# Patient Record
Sex: Female | Born: 1980 | Race: White | Hispanic: No | Marital: Single | Smoking: Never smoker
Health system: Southern US, Community
[De-identification: ages and names within clinical notes are randomized; demographics above are authoritative.]

## PROBLEM LIST (undated history)

## (undated) DIAGNOSIS — N189 Chronic kidney disease, unspecified: Secondary | ICD-10-CM

## (undated) DIAGNOSIS — J309 Allergic rhinitis, unspecified: Secondary | ICD-10-CM

## (undated) DIAGNOSIS — J069 Acute upper respiratory infection, unspecified: Secondary | ICD-10-CM

## (undated) DIAGNOSIS — E2740 Unspecified adrenocortical insufficiency: Secondary | ICD-10-CM

## (undated) DIAGNOSIS — E240 Pituitary-dependent Cushing's disease: Secondary | ICD-10-CM

## (undated) DIAGNOSIS — M3130 Wegener's granulomatosis without renal involvement: Secondary | ICD-10-CM

## (undated) DIAGNOSIS — H52209 Unspecified astigmatism, unspecified eye: Secondary | ICD-10-CM

## (undated) DIAGNOSIS — I10 Essential (primary) hypertension: Secondary | ICD-10-CM

## (undated) DIAGNOSIS — D709 Neutropenia, unspecified: Secondary | ICD-10-CM

## (undated) DIAGNOSIS — J45909 Unspecified asthma, uncomplicated: Secondary | ICD-10-CM

## (undated) HISTORY — DX: Essential (primary) hypertension: I10

## (undated) HISTORY — DX: Allergic rhinitis, unspecified: J30.9

## (undated) HISTORY — DX: Unspecified astigmatism, unspecified eye: H52.209

## (undated) HISTORY — DX: Unspecified adrenocortical insufficiency: E27.40

## (undated) HISTORY — PX: ANTERIOR CERVICAL DECOMP/DISCECTOMY FUSION: SHX1161

## (undated) HISTORY — DX: Neutropenia, unspecified: D70.9

## (undated) HISTORY — PX: ADENOIDECTOMY: SUR15

## (undated) HISTORY — DX: Chronic kidney disease, unspecified: N18.9

## (undated) HISTORY — DX: Unspecified asthma, uncomplicated: J45.909

## (undated) HISTORY — DX: Pituitary-dependent Cushing's disease: E24.0

## (undated) HISTORY — DX: Acute upper respiratory infection, unspecified: J06.9

---
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
## 2002-02-04 ENCOUNTER — Other Ambulatory Visit: Admission: RE | Admit: 2002-02-04 | Discharge: 2002-02-04 | Payer: Self-pay | Admitting: Internal Medicine

## 2004-08-09 ENCOUNTER — Emergency Department (HOSPITAL_COMMUNITY): Admission: EM | Admit: 2004-08-09 | Discharge: 2004-08-09 | Payer: Self-pay | Admitting: *Deleted

## 2006-01-29 ENCOUNTER — Emergency Department (HOSPITAL_COMMUNITY): Admission: EM | Admit: 2006-01-29 | Discharge: 2006-01-29 | Payer: Self-pay | Admitting: Emergency Medicine

## 2014-09-13 ENCOUNTER — Encounter (HOSPITAL_BASED_OUTPATIENT_CLINIC_OR_DEPARTMENT_OTHER): Payer: Self-pay | Admitting: Emergency Medicine

## 2014-09-13 ENCOUNTER — Emergency Department (HOSPITAL_BASED_OUTPATIENT_CLINIC_OR_DEPARTMENT_OTHER)
Admission: EM | Admit: 2014-09-13 | Discharge: 2014-09-13 | Disposition: A | Discharge status: Home / Self Care | Payer: BLUE CROSS/BLUE SHIELD | Attending: Emergency Medicine | Admitting: Emergency Medicine

## 2014-09-13 DIAGNOSIS — Z880 Allergy status to penicillin: Secondary | ICD-10-CM | POA: Diagnosis not present

## 2014-09-13 DIAGNOSIS — M542 Cervicalgia: Secondary | ICD-10-CM

## 2014-09-13 DIAGNOSIS — M5412 Radiculopathy, cervical region: Secondary | ICD-10-CM | POA: Diagnosis not present

## 2014-09-13 DIAGNOSIS — R200 Anesthesia of skin: Secondary | ICD-10-CM | POA: Diagnosis not present

## 2014-09-13 MED ORDER — HYDROCODONE-ACETAMINOPHEN 5-325 MG PO TABS: 1.0000 | ORAL_TABLET | ORAL | Status: DC | PRN

## 2014-09-13 MED ORDER — DIAZEPAM 5 MG PO TABS: 5.0000 mg | ORAL_TABLET | Freq: Four times a day (QID) | ORAL | Status: DC | PRN

## 2014-09-13 NOTE — ED Notes (Signed)
Patient states that she has had neck and shoulder pain x 1 week. Been to urgent care and chiropractor  - patient on medications to assist but the pain is worse and her right arm is going  Numb.

## 2014-09-13 NOTE — Discharge Instructions (Signed)
Cervical Radiculopathy °Cervical radiculopathy happens when a nerve in the neck is pinched or bruised by a slipped (herniated) disk or by arthritic changes in the bones of the cervical spine. This can occur due to an injury or as part of the normal aging process. Pressure on the cervical nerves can cause pain or numbness that runs from your neck all the way down into your arm and fingers. °CAUSES  °There are many possible causes, including: °· Injury. °· Muscle tightness in the neck from overuse. °· Swollen, painful joints (arthritis). °· Breakdown or degeneration in the bones and joints of the spine (spondylosis) due to aging. °· Bone spurs that may develop near the cervical nerves. °SYMPTOMS  °Symptoms include pain, weakness, or numbness in the affected arm and hand. Pain can be severe or irritating. Symptoms may be worse when extending or turning the neck. °DIAGNOSIS  °Your caregiver will ask about your symptoms and do a physical exam. He or she may test your strength and reflexes. X-rays, CT scans, and MRI scans may be needed in cases of injury or if the symptoms do not go away after a period of time. Electromyography (EMG) or nerve conduction testing may be done to study how your nerves and muscles are working. °TREATMENT  °Your caregiver may recommend certain exercises to help relieve your symptoms. Cervical radiculopathy can, and often does, get better with time and treatment. If your problems continue, treatment options may include: °· Wearing a soft collar for short periods of time. °· Physical therapy to strengthen the neck muscles. °· Medicines, such as nonsteroidal anti-inflammatory drugs (NSAIDs), oral corticosteroids, or spinal injections. °· Surgery. Different types of surgery may be done depending on the cause of your problems. °HOME CARE INSTRUCTIONS  °· Put ice on the affected area. °· Put ice in a plastic bag. °· Place a towel between your skin and the bag. °· Leave the ice on for 15-20 minutes,  03-04 times a day or as directed by your caregiver. °· If ice does not help, you can try using heat. Take a warm shower or bath, or use a hot water bottle as directed by your caregiver. °· You may try a gentle neck and shoulder massage. °· Use a flat pillow when you sleep. °· Only take over-the-counter or prescription medicines for pain, discomfort, or fever as directed by your caregiver. °· If physical therapy was prescribed, follow your caregiver's directions. °· If a soft collar was prescribed, use it as directed. °SEEK IMMEDIATE MEDICAL CARE IF:  °· Your pain gets much worse and cannot be controlled with medicines. °· You have weakness or numbness in your hand, arm, face, or leg. °· You have a high fever or a stiff, rigid neck. °· You lose bowel or bladder control (incontinence). °· You have trouble with walking, balance, or speaking. °MAKE SURE YOU:  °· Understand these instructions. °· Will watch your condition. °· Will get help right away if you are not doing well or get worse. °Document Released: 12/06/2000 Document Revised: 06/05/2011 Document Reviewed: 10/25/2010 °ExitCare® Patient Information ©2015 [employer]. This information is not intended to replace advice given to you by your health care provider. Make sure you discuss any questions you have with your health care provider. ° ° ° ° ° °Cervical Sprain °A cervical sprain is an injury in the neck in which the strong, fibrous tissues (ligaments) that connect your neck bones stretch or tear. Cervical sprains can range from mild to severe. Severe cervical sprains   can cause the neck vertebrae to be unstable. This can lead to damage of the spinal cord and can result in serious nervous system problems. The amount of time it takes for a cervical sprain to get better depends on the cause and extent of the injury. Most cervical sprains heal in 1 to 3 weeks. °CAUSES  °Severe cervical sprains may be caused by:  °· Contact sport injuries (such as from football,  rugby, wrestling, hockey, auto racing, gymnastics, diving, martial arts, or boxing).   °· Motor vehicle collisions.   °· Whiplash injuries. This is an injury from a sudden forward and backward whipping movement of the head and neck.  °· Falls.   °Mild cervical sprains may be caused by:  °· Being in an awkward position, such as while cradling a telephone between your ear and shoulder.   °· Sitting in a chair that does not offer proper support.   °· Working at a poorly designed computer station.   °· Looking up or down for long periods of time.   °SYMPTOMS  °· Pain, soreness, stiffness, or a burning sensation in the front, back, or sides of the neck. This discomfort may develop immediately after the injury or slowly, 24 hours or more after the injury.   °· Pain or tenderness directly in the middle of the back of the neck.   °· Shoulder or upper back pain.   °· Limited ability to move the neck.   °· Headache.   °· Dizziness.   °· Weakness, numbness, or tingling in the hands or arms.   °· Muscle spasms.   °· Difficulty swallowing or chewing.   °· Tenderness and swelling of the neck.   °DIAGNOSIS  °Most of the time your health care provider can diagnose a cervical sprain by taking your history and doing a physical exam. Your health care provider will ask about previous neck injuries and any known neck problems, such as arthritis in the neck. X-rays may be taken to find out if there are any other problems, such as with the bones of the neck. Other tests, such as a CT scan or MRI, may also be needed.  °TREATMENT  °Treatment depends on the severity of the cervical sprain. Mild sprains can be treated with rest, keeping the neck in place (immobilization), and pain medicines. Severe cervical sprains are immediately immobilized. Further treatment is done to help with pain, muscle spasms, and other symptoms and may include: °· Medicines, such as pain relievers, numbing medicines, or muscle relaxants.   °· Physical therapy. This  may involve stretching exercises, strengthening exercises, and posture training. Exercises and improved posture can help stabilize the neck, strengthen muscles, and help stop symptoms from returning.   °HOME CARE INSTRUCTIONS  °· Put ice on the injured area.   °¨ Put ice in a plastic bag.   °¨ Place a towel between your skin and the bag.   °¨ Leave the ice on for 15-20 minutes, 3-4 times a day.   °· If your injury was severe, you may have been given a cervical collar to wear. A cervical collar is a two-piece collar designed to keep your neck from moving while it heals. °¨ Do not remove the collar unless instructed by your health care provider. °¨ If you have long hair, keep it outside of the collar. °¨ Ask your health care provider before making any adjustments to your collar. Minor adjustments may be required over time to improve comfort and reduce pressure on your chin or on the back of your head. °¨ If you are allowed to remove the collar for cleaning or bathing, follow your   health care provider's instructions on how to do so safely. °¨ Keep your collar clean by wiping it with mild soap and water and drying it completely. If the collar you have been given includes removable pads, remove them every 1-2 days and hand wash them with soap and water. Allow them to air dry. They should be completely dry before you wear them in the collar. °¨ If you are allowed to remove the collar for cleaning and bathing, wash and dry the skin of your neck. Check your skin for irritation or sores. If you see any, tell your health care provider. °¨ Do not drive while wearing the collar.   °· Only take over-the-counter or prescription medicines for pain, discomfort, or fever as directed by your health care provider.   °· Keep all follow-up appointments as directed by your health care provider.   °· Keep all physical therapy appointments as directed by your health care provider.   °· Make any needed adjustments to your workstation to  promote good posture.   °· Avoid positions and activities that make your symptoms worse.   °· Warm up and stretch before being active to help prevent problems.   °SEEK MEDICAL CARE IF:  °· Your pain is not controlled with medicine.   °· You are unable to decrease your pain medicine over time as planned.   °· Your activity level is not improving as expected.   °SEEK IMMEDIATE MEDICAL CARE IF:  °· You develop any bleeding. °· You develop stomach upset. °· You have signs of an allergic reaction to your medicine.   °· Your symptoms get worse.   °· You develop new, unexplained symptoms.   °· You have numbness, tingling, weakness, or paralysis in any part of your body.   °MAKE SURE YOU:  °· Understand these instructions. °· Will watch your condition. °· Will get help right away if you are not doing well or get worse. °Document Released: 01/08/2007 Document Revised: 03/18/2013 Document Reviewed: 09/18/2012 °ExitCare® Patient Information ©2015 [employer]. This information is not intended to replace advice given to you by your health care provider. Make sure you discuss any questions you have with your health care provider. ° °

## 2014-09-13 NOTE — ED Provider Notes (Signed)
CSN: [id number]     Arrival date & time 09/13/14  1835 History  This chart was scribed for Pricilla Loveless, MD by Doreatha Martin, ED Scribe. This patient was seen in room MH01/MH01 and the patient's care was started at 6:59 PM.     Chief Complaint  Patient presents with  . Neck Pain   The history is provided by the patient. No language interpreter was used.    HPI Comments: Diane Wells is a 34 y.o. female who presents to the Emergency Department complaining of moderate, constant medial 6/10 neck pain onset 6 days ago and worsened 4 days ago. She states associated numbness and pain in the right arm when she turns her neck to the left onset 2 days ago. She notes that the numbness is usually from shoulder to elbow but occasionally spreads to the fingers. This lasts a few seconds and is accompanied with a jolting pain.  Pt reports that neck pain is worsened by extension of the right arm. Pt states that she has been in [place] for the past week on vacation and woke up 6 days ago with neck soreness and shoulder tightness. She states that she went to Urgent Care and they prescribed 7 days of 20mg  Prednisone and Cyclobenzaprine with no relief. She then went to a Chiropractor 3 days ago, who tried to adjust neck and shoulders and took XR. She states no relief from this visit. She states the chiropractor barely manipulated her neck due to it being "tight". Pt notes that the Prednisone and Cyclobenzaprine has lessened the tightness. She states that she did not take the muscle relaxer today since she was driving back from .  No other OTC pain medication tried PTA. PCP is with [facility]. She denies any recent injury or trauma. She also denies weakness, any issues with her lower extremities, HA, fevers and blurry vision. She has not taken any Ibuprofen because she has been on the prednisone and doesn't want to cause stomach problems   History reviewed. No pertinent past medical history. History  reviewed. No pertinent past surgical history. History reviewed. No pertinent family history. History  Substance Use Topics  . Smoking status: Never Smoker   . Smokeless tobacco: Not on file  . Alcohol Use: No   OB History    No data available     Review of Systems  Constitutional: Negative for fever.  Eyes: Negative for visual disturbance.  Gastrointestinal: Negative for vomiting.  Musculoskeletal: Positive for neck pain. Negative for back pain.  Neurological: Positive for numbness. Negative for weakness and headaches.  All other systems reviewed and are negative.  Allergies  Penicillins  Home Medications   Prior to Admission medications   Not on File   Triage VS: BP 158/91 mmHg  Pulse 101  Temp(Src) 98 F (36.7 C) (Oral)  Resp 16  Ht 5\' 6"  (1.676 m)  Wt 240 lb (108.863 kg)  BMI 38.76 kg/m2  SpO2 100%  LMP 08/31/2014 Physical Exam  Constitutional: She is oriented to person, place, and time. She appears well-developed and well-nourished.  HENT:  Head: Normocephalic and atraumatic.  Right Ear: External ear normal.  Left Ear: External ear normal.  Nose: Nose normal.  Eyes: Right eye exhibits no discharge. Left eye exhibits no discharge.  Neck: Neck supple. Muscular tenderness present. No rigidity. Decreased range of motion (to the left and up due to pain) present.    Cardiovascular: Normal rate, regular rhythm and normal heart sounds.  Pulses:      Radial pulses are 2+ on the right side, and 2+ on the left side.  Pulmonary/Chest: Effort normal and breath sounds normal.  Abdominal: Soft. There is no tenderness.  Neurological: She is alert and oriented to person, place, and time.  CN 2-12 grossly intact. 5/5 strength in all 4 extremities. Grossly normal sensation  Skin: Skin is warm and dry.  Nursing note and vitals reviewed.  ED Course  Procedures (including critical care time) DIAGNOSTIC STUDIES: Oxygen Saturation is 100% on RA, normal by my interpretation.     COORDINATION OF CARE: 7:16 PM Discussed treatment plan with pt at bedside and pt agreed to plan.   Labs Review Labs Reviewed - No data to display  Imaging Review No results found.   EKG Interpretation None     MDM   Final diagnoses:  Neck pain  Cervical radiculopathy    Patient appears to have muscle spasms as well as cervical radiculopathy with pain shooting down her right arm when she moves her head to the left. There has been no trauma. She supposedly had a benign x-ray at her chiropractor but I am unable to see this. The patient does not have any neurologic dysfunction and has multiple pulses as well. She did not get her neck manipulated at the chiropractor, only her shoulder. Thus I have very low suspicion for an arterial dissection, especially with no significant strokelike symptoms. At this point will take her off Flexeril and try Valium as well as hydrocodone. She has only been on 20 mg per day of prednisone for the past 5 days. I told her she can stop this as I doubt it would be helpful anyway. Risk of adrenal suppression is quite low. This way she will feel more comfortable taking ibuprofen which I think will be more beneficial. I have recommended close follow-up with her PCP, at this point there is no indication for emergent MRI but she may need one as an outpatient.  I personally performed the services described in this documentation, which was scribed in my presence. The recorded information has been reviewed and is accurate.   Pricilla Loveless, MD 09/13/14 2020

## 2014-09-13 NOTE — ED Notes (Signed)
Reports pain started on Monday with cramping on rt side neck, seen at urgent care  On Wednesday was given prednisone and muscle relaxant. Did not take the muscle relaxant today because she drove straight from [place] today, yet the other days three times with no relief yet diminished muscle spasm.

## 2015-06-29 DIAGNOSIS — R5383 Other fatigue: Secondary | ICD-10-CM | POA: Diagnosis not present

## 2015-06-29 DIAGNOSIS — E782 Mixed hyperlipidemia: Secondary | ICD-10-CM | POA: Diagnosis not present

## 2015-06-29 DIAGNOSIS — J019 Acute sinusitis, unspecified: Secondary | ICD-10-CM | POA: Diagnosis not present

## 2015-06-29 DIAGNOSIS — R0602 Shortness of breath: Secondary | ICD-10-CM | POA: Diagnosis not present

## 2015-12-03 DIAGNOSIS — M62838 Other muscle spasm: Secondary | ICD-10-CM | POA: Diagnosis not present

## 2015-12-03 DIAGNOSIS — M9901 Segmental and somatic dysfunction of cervical region: Secondary | ICD-10-CM | POA: Diagnosis not present

## 2015-12-03 DIAGNOSIS — M542 Cervicalgia: Secondary | ICD-10-CM | POA: Diagnosis not present

## 2015-12-03 DIAGNOSIS — M9902 Segmental and somatic dysfunction of thoracic region: Secondary | ICD-10-CM | POA: Diagnosis not present

## 2015-12-06 DIAGNOSIS — M542 Cervicalgia: Secondary | ICD-10-CM | POA: Diagnosis not present

## 2015-12-08 DIAGNOSIS — M9902 Segmental and somatic dysfunction of thoracic region: Secondary | ICD-10-CM | POA: Diagnosis not present

## 2015-12-08 DIAGNOSIS — M62838 Other muscle spasm: Secondary | ICD-10-CM | POA: Diagnosis not present

## 2015-12-08 DIAGNOSIS — M542 Cervicalgia: Secondary | ICD-10-CM | POA: Diagnosis not present

## 2015-12-08 DIAGNOSIS — M9901 Segmental and somatic dysfunction of cervical region: Secondary | ICD-10-CM | POA: Diagnosis not present

## 2015-12-09 DIAGNOSIS — M62838 Other muscle spasm: Secondary | ICD-10-CM | POA: Diagnosis not present

## 2015-12-09 DIAGNOSIS — M542 Cervicalgia: Secondary | ICD-10-CM | POA: Diagnosis not present

## 2015-12-09 DIAGNOSIS — M9901 Segmental and somatic dysfunction of cervical region: Secondary | ICD-10-CM | POA: Diagnosis not present

## 2015-12-09 DIAGNOSIS — M9902 Segmental and somatic dysfunction of thoracic region: Secondary | ICD-10-CM | POA: Diagnosis not present

## 2015-12-15 DIAGNOSIS — M9902 Segmental and somatic dysfunction of thoracic region: Secondary | ICD-10-CM | POA: Diagnosis not present

## 2015-12-15 DIAGNOSIS — M62838 Other muscle spasm: Secondary | ICD-10-CM | POA: Diagnosis not present

## 2015-12-15 DIAGNOSIS — M9901 Segmental and somatic dysfunction of cervical region: Secondary | ICD-10-CM | POA: Diagnosis not present

## 2015-12-15 DIAGNOSIS — M542 Cervicalgia: Secondary | ICD-10-CM | POA: Diagnosis not present

## 2016-01-03 DIAGNOSIS — J3089 Other allergic rhinitis: Secondary | ICD-10-CM | POA: Diagnosis not present

## 2016-01-03 DIAGNOSIS — J9801 Acute bronchospasm: Secondary | ICD-10-CM | POA: Diagnosis not present

## 2016-01-10 ENCOUNTER — Encounter: Payer: Self-pay | Admitting: Family Medicine

## 2016-01-10 ENCOUNTER — Ambulatory Visit (INDEPENDENT_AMBULATORY_CARE_PROVIDER_SITE_OTHER): Payer: BLUE CROSS/BLUE SHIELD | Admitting: Family Medicine

## 2016-01-10 VITALS — BP 110/84 | HR 96 | Temp 98.2°F | Ht 66.0 in | Wt 268.0 lb

## 2016-01-10 DIAGNOSIS — Z13220 Encounter for screening for lipoid disorders: Secondary | ICD-10-CM | POA: Diagnosis not present

## 2016-01-10 DIAGNOSIS — R198 Other specified symptoms and signs involving the digestive system and abdomen: Secondary | ICD-10-CM

## 2016-01-10 DIAGNOSIS — Z131 Encounter for screening for diabetes mellitus: Secondary | ICD-10-CM | POA: Diagnosis not present

## 2016-01-10 DIAGNOSIS — Z1329 Encounter for screening for other suspected endocrine disorder: Secondary | ICD-10-CM | POA: Diagnosis not present

## 2016-01-10 DIAGNOSIS — T7840XA Allergy, unspecified, initial encounter: Secondary | ICD-10-CM | POA: Diagnosis not present

## 2016-01-10 DIAGNOSIS — Z23 Encounter for immunization: Secondary | ICD-10-CM

## 2016-01-10 DIAGNOSIS — E669 Obesity, unspecified: Secondary | ICD-10-CM | POA: Insufficient documentation

## 2016-01-10 LAB — CBC
HCT: 41.4 % (ref 36.0–46.0)
Hemoglobin: 14.2 g/dL (ref 12.0–15.0)
MCHC: 34.2 g/dL (ref 30.0–36.0)
MCV: 88.2 fl (ref 78.0–100.0)
Platelets: 343 10*3/uL (ref 150.0–400.0)
RBC: 4.7 Mil/uL (ref 3.87–5.11)
RDW: 13.2 % (ref 11.5–15.5)
WBC: 15.7 10*3/uL — ABNORMAL HIGH (ref 4.0–10.5)

## 2016-01-10 LAB — LIPID PANEL
Cholesterol: 188 mg/dL (ref 0–200)
HDL: 54.9 mg/dL (ref >39.00)
LDL Cholesterol: 108 mg/dL — ABNORMAL HIGH (ref 0–99)
NonHDL: 133.42
Total CHOL/HDL Ratio: 3
Triglycerides: 128 mg/dL (ref 0.0–149.0)
VLDL: 25.6 mg/dL (ref 0.0–40.0)

## 2016-01-10 LAB — COMPREHENSIVE METABOLIC PANEL
ALT: 9 U/L (ref 0–35)
AST: 10 U/L (ref 0–37)
Albumin: 4 g/dL (ref 3.5–5.2)
Alkaline Phosphatase: 49 U/L (ref 39–117)
BUN: 16 mg/dL (ref 6–23)
CO2: 28 mEq/L (ref 19–32)
Calcium: 9 mg/dL (ref 8.4–10.5)
Chloride: 99 mEq/L (ref 96–112)
Creatinine, Ser: 0.73 mg/dL (ref 0.40–1.20)
GFR: 96.43 mL/min (ref >60.00)
Glucose, Bld: 78 mg/dL (ref 70–99)
Potassium: 3.6 mEq/L (ref 3.5–5.1)
Sodium: 137 mEq/L (ref 135–145)
Total Bilirubin: 0.5 mg/dL (ref 0.2–1.2)
Total Protein: 7.5 g/dL (ref 6.0–8.3)

## 2016-01-10 LAB — TSH: TSH: 1.15 u[IU]/mL (ref 0.35–4.50)

## 2016-01-10 LAB — HEMOGLOBIN A1C: Hgb A1c MFr Bld: 5.1 % (ref 4.6–6.5)

## 2016-01-10 MED ORDER — EPINEPHRINE 0.3 MG/0.3ML IJ SOAJ: 0.3000 mg | Freq: Once | INTRAMUSCULAR | 1 refills | Status: AC

## 2016-01-10 NOTE — Patient Instructions (Signed)
It was very nice to see you today- we will look forward to seeing you for your physical soon Continue the flovent inhaler and the albuterol as needed.  Get the epipen and keep it handy.  I will also refer you to an allergist.  Let me know if your belly button bleeding is not resolved!

## 2016-01-10 NOTE — Progress Notes (Signed)
Healthcare at [facility] [address] [place], [place] [address] [phone]  Date:  01/10/2016   Name:  ALLETA Wells   DOB:  1980/08/10   MRN:  [id number]  PCP:  Abbe Amsterdam, MD    Chief Complaint: Establish Care   History of Present Illness:  Diane Wells is a 35 y.o. very pleasant female patient who presents with the following:  She is here today as a new patient- she works at [employer].    She has been going to [facility] for the last few years but wants to establish with [place] now.   She does need an OBG- she has history of ovarian cysts, never had an abnl pap.    She is from this area.   During her free time she enjoys taking care of her 3 dogs and going for walks. She works a lot of hours right now, often 12 hours a day so her time to do other things is limited.    She has noted some bleeding from her belly button for the last 3 months.   It will just ooze mildly, she will notice a little blood there most days.  No belly pain, redness, or odor.   Also 1 week ago she was seen at [facility]- she reports that she had wheezing and some tongue swelling. She was treated with prednisone and started on flovent and pro-air.  They thought she was having an allergic reaction or asthma.   She feels like she is "75% better" but will occasionally feel like she can't get a full breath. She feels a tickle in her throat.   No anaphylaxis sx. She is not aware of any new medications, foods, etc.  She did try inhaling some essential oils but this just seemed to make her worse.  She took pred 40 for 5 days, now finished with this medication.  She avoided NSAIDs while on on prednisone   There are no active problems to display for this patient.   No past medical history on file.  No past surgical history on file.  Social History  Substance Use Topics  . Smoking status: Never Smoker  . Smokeless tobacco: Not on file  .  Alcohol use No    No family history on file.  Allergies  Allergen Reactions  . Penicillins Hives    Medication list has been reviewed and updated.  No current outpatient prescriptions on file prior to visit.   No current facility-administered medications on file prior to visit.     Review of Systems:  As per HPI- otherwise negative.   Physical Examination: Vitals:   01/10/16 1317  BP: 110/84  Pulse: 96  Temp: 98.2 F (36.8 C)   Vitals:   01/10/16 1317  Weight: 268 lb (121.6 kg)  Height: 5\' 6"  (1.676 m)   Body mass index is 43.26 kg/m. Ideal Body Weight: Weight in (lb) to have BMI = 25: 154.6  GEN: WDWN, NAD, Non-toxic, A & O x 3, obese, looks well HEENT: Atraumatic, Normocephalic. Neck supple. No masses, No LAD.  Bilateral TM wnl, oropharynx normal.  PEERL,EOMI.   No active angioedema Ears and Nose: No external deformity. CV: RRR, No M/G/R. No JVD. No thrill. No extra heart sounds. PULM: CTA B, no wheezes, crackles, rhonchi. No retractions. No resp. distress. No accessory muscle use.  Benign exam ABD: S, NT, ND, +BS. No rebound. No HSM. EXTR: No  c/c/e NEURO Normal gait.  PSYCH: Normally interactive. Conversant. Not depressed or anxious appearing.  Calm demeanor.  No visible bleeding at umbilicus but a q-tip inserted into the belly button comes out slightly bloody.  Used ear speculum to look inside- there is a small area of raw appearing tissue.  Used silver nitrate sticks to try and cauterize tissue. No foul odor or evidence of infection at this time   Assessment and Plan: Allergic reaction, initial encounter - Plan: EPINEPHrine 0.3 mg/0.3 mL IJ SOAJ injection, Ambulatory referral to Allergy  Screening for thyroid disorder - Plan: TSH  Screening for hyperlipidemia - Plan: Lipid panel  Screening for diabetes mellitus - Plan: Comprehensive metabolic panel, Hemoglobin A1C  Umbilical bleeding - Plan: CBC  Recent allergic reaction of unknown origin.  Gave her  an rx for epipen and discussed use. She would like a referral to allergy and I will place this for her Labs as above-she did eat a protein bar today Tried to resolve umbilical oozing of blood as above. She will let me know if sx not resolved and plans to return for a CPE soon  Signed Abbe Amsterdam, MD

## 2016-01-10 NOTE — Progress Notes (Signed)
Pre visit review using our clinic review tool, if applicable. No additional management support is needed unless otherwise documented below in the visit note. 

## 2016-01-13 DIAGNOSIS — M62838 Other muscle spasm: Secondary | ICD-10-CM | POA: Diagnosis not present

## 2016-01-13 DIAGNOSIS — M542 Cervicalgia: Secondary | ICD-10-CM | POA: Diagnosis not present

## 2016-01-13 DIAGNOSIS — M9901 Segmental and somatic dysfunction of cervical region: Secondary | ICD-10-CM | POA: Diagnosis not present

## 2016-01-13 DIAGNOSIS — M9902 Segmental and somatic dysfunction of thoracic region: Secondary | ICD-10-CM | POA: Diagnosis not present

## 2016-02-15 ENCOUNTER — Ambulatory Visit (INDEPENDENT_AMBULATORY_CARE_PROVIDER_SITE_OTHER): Payer: BLUE CROSS/BLUE SHIELD | Admitting: Allergy and Immunology

## 2016-02-15 ENCOUNTER — Encounter: Payer: Self-pay | Admitting: Allergy and Immunology

## 2016-02-15 VITALS — BP 126/80 | HR 80 | Temp 97.7°F | Resp 16 | Ht 66.0 in | Wt 265.0 lb

## 2016-02-15 DIAGNOSIS — J3089 Other allergic rhinitis: Secondary | ICD-10-CM | POA: Diagnosis not present

## 2016-02-15 DIAGNOSIS — T7840XA Allergy, unspecified, initial encounter: Secondary | ICD-10-CM

## 2016-02-15 DIAGNOSIS — H1010 Acute atopic conjunctivitis, unspecified eye: Secondary | ICD-10-CM | POA: Insufficient documentation

## 2016-02-15 DIAGNOSIS — H1013 Acute atopic conjunctivitis, bilateral: Secondary | ICD-10-CM | POA: Diagnosis not present

## 2016-02-15 DIAGNOSIS — J4540 Moderate persistent asthma, uncomplicated: Secondary | ICD-10-CM | POA: Diagnosis not present

## 2016-02-15 MED ORDER — PROAIR HFA 108 (90 BASE) MCG/ACT IN AERS: 1.0000 | INHALATION_SPRAY | Freq: Every day | RESPIRATORY_TRACT | 1 refills | Status: DC

## 2016-02-15 MED ORDER — MOMETASONE FUROATE 50 MCG/ACT NA SUSP: NASAL | 5 refills | Status: DC

## 2016-02-15 MED ORDER — MONTELUKAST SODIUM 10 MG PO TABS
10.0000 mg | ORAL_TABLET | Freq: Every day | ORAL | 5 refills | Status: DC
Start: 2016-02-15 — End: 2016-06-11

## 2016-02-15 MED ORDER — FLOVENT HFA 110 MCG/ACT IN AERO: 2.0000 | INHALATION_SPRAY | Freq: Two times a day (BID) | RESPIRATORY_TRACT | 3 refills | Status: DC

## 2016-02-15 MED ORDER — OLOPATADINE HCL 0.2 % OP SOLN: 1.0000 [drp] | Freq: Every day | OPHTHALMIC | 5 refills | Status: DC

## 2016-02-15 NOTE — Assessment & Plan Note (Addendum)
   Aeroallergen avoidance measures have been discussed and provided in written form.  A prescription has been provided for levocetirizine, 5mg  daily as needed.  A prescription has been provided for Nasonex nasal spray, one spray per nostril 1-2 times daily as needed. Proper nasal spray technique has been discussed and demonstrated.  I have also recommended nasal saline spray (i.e., Simply Saline) or nasal saline lavage (i.e., NeilMed) as needed prior to medicated nasal sprays.  Montelukast has been prescribed (sees as above).  The risks and benefits of aeroallergen immunotherapy have been discussed. The patient is motivated to initiate immunotherapy to reduce symptoms and decrease medication requirement. Informed consent has been signed and allergen vaccine orders have been submitted. Medications will be decreased or discontinued as symptom relief from immunotherapy becomes evident.

## 2016-02-15 NOTE — Patient Instructions (Addendum)
Allergic reaction The patient's history suggests allergic reaction with an unclear trigger versus asthma exacerbation. Food allergen skin tests were negative today despite a positive histamine control. The negative predictive value for skin tests is excellent (greater than 95%).   Should symptoms recur despite asthma being controlled, the patient has been asked to keep a journal to record any foods eaten, beverages consumed, medications taken within a 6 hour period prior to the onset of symptoms, as well as record activities being performed, and environmental conditions. For any symptoms concerning for anaphylaxis, epinephrine is to be administered and 911 is to be called immediately.  If this occurs, we will check labs to rule out other potential etiologies.  Moderate persistent asthma  A prescription has been provided for montelukast 10 mg daily at bedtime.  For now, continue Flovent 110 g, 2 inhalations twice a day.  To maximize pulmonary deposition, a spacer has been provided along with instructions for its proper administration with an HFA inhaler.  Subjective and objective measures of pulmonary function will be followed and the treatment plan will be adjusted accordingly.  Perennial and seasonal allergic rhinitis  Aeroallergen avoidance measures have been discussed and provided in written form.  A prescription has been provided for levocetirizine, 5mg  daily as needed.  A prescription has been provided for Nasonex nasal spray, one spray per nostril 1-2 times daily as needed. Proper nasal spray technique has been discussed and demonstrated.  I have also recommended nasal saline spray (i.e., Simply Saline) or nasal saline lavage (i.e., NeilMed) as needed prior to medicated nasal sprays.  Montelukast has been prescribed (sees as above).  The risks and benefits of aeroallergen immunotherapy have been discussed. The patient is motivated to initiate immunotherapy to reduce symptoms and  decrease medication requirement. Informed consent has been signed and allergen vaccine orders have been submitted. Medications will be decreased or discontinued as symptom relief from immunotherapy becomes evident.  Allergic conjunctivitis  Treatment plan as outlined above for allergic rhinitis.  A prescription has been provided for Pazeo, one drop per eye daily as needed.  I have also recommended eye lubricant drops (i.e., Natural Tears) as needed.   Return in about 1 week (around 02/22/2016) for allergy injections.  Reducing Pollen Exposure  The American Academy of Allergy, Asthma and Immunology suggests the following steps to reduce your exposure to pollen during allergy seasons.    1. Do not hang sheets or clothing out to dry; pollen may collect on these items. 2. Do not mow lawns or spend time around freshly cut grass; mowing stirs up pollen. 3. Keep windows closed at night.  Keep car windows closed while driving. 4. Minimize morning activities outdoors, a time when pollen counts are usually at their highest. 5. Stay indoors as much as possible when pollen counts or humidity is high and on windy days when pollen tends to remain in the air longer. 6. Use air conditioning when possible.  Many air conditioners have filters that trap the pollen spores. 7. Use a HEPA room air filter to remove pollen form the indoor air you breathe.   Control of House Dust Mite Allergen  House dust mites play a major role in allergic asthma and rhinitis.  They occur in environments with high humidity wherever human skin, the food for dust mites is found. High levels have been detected in dust obtained from mattresses, pillows, carpets, upholstered furniture, bed covers, clothes and soft toys.  The principal allergen of the house dust mite is found  in its feces.  A gram of dust may contain 1,000 mites and 250,000 fecal particles.  Mite antigen is easily measured in the air during house cleaning  activities.    1. Encase mattresses, including the box spring, and pillow, in an air tight cover.  Seal the zipper end of the encased mattresses with wide adhesive tape. 2. Wash the bedding in water of 130 degrees Farenheit weekly.  Avoid cotton comforters/quilts and flannel bedding: the most ideal bed covering is the dacron comforter. 3. Remove all upholstered furniture from the bedroom. 4. Remove carpets, carpet padding, rugs, and non-washable window drapes from the bedroom.  Wash drapes weekly or use plastic window coverings. 5. Remove all non-washable stuffed toys from the bedroom.  Wash stuffed toys weekly. 6. Have the room cleaned frequently with a vacuum cleaner and a damp dust-mop.  The patient should not be in a room which is being cleaned and should wait 1 hour after cleaning before going into the room. 7. Close and seal all heating outlets in the bedroom.  Otherwise, the room will become filled with dust-laden air.  An electric heater can be used to heat the room. Reduce indoor humidity to less than 50%.  Do not use a humidifier.  Control of Dog or Cat Allergen  Avoidance is the best way to manage a dog or cat allergy. If you have a dog or cat and are allergic to dog or cats, consider removing the dog or cat from the home. If you have a dog or cat but don't want to find it a new home, or if your family wants a pet even though someone in the household is allergic, here are some strategies that may help keep symptoms at bay:  1. Keep the pet out of your bedroom and restrict it to only a few rooms. Be advised that keeping the dog or cat in only one room will not limit the allergens to that room. 2. Don't pet, hug or kiss the dog or cat; if you do, wash your hands with soap and water. 3. High-efficiency particulate air (HEPA) cleaners run continuously in a bedroom or living room can reduce allergen levels over time. 4. Regular use of a high-efficiency vacuum cleaner or a central vacuum can  reduce allergen levels. 5. Giving your dog or cat a bath at least once a week can reduce airborne allergen.  Control of Mold Allergen  Mold and fungi can grow on a variety of surfaces provided certain temperature and moisture conditions exist.  Outdoor molds grow on plants, decaying vegetation and soil.  The major outdoor mold, Alternaria and Cladosporium, are found in very high numbers during hot and dry conditions.  Generally, a late Summer - Fall peak is seen for common outdoor fungal spores.  Rain will temporarily lower outdoor mold spore count, but counts rise rapidly when the rainy period ends.  The most important indoor molds are Aspergillus and Penicillium.  Dark, humid and poorly ventilated basements are ideal sites for mold growth.  The next most common sites of mold growth are the bathroom and the kitchen.  Outdoor [employer] 1. Use air conditioning and keep windows closed 2. Avoid exposure to decaying vegetation. 3. Avoid leaf raking. 4. Avoid grain handling. 5. Consider wearing a face mask if working in moldy areas.  Indoor Mold Control 1. Maintain humidity below 50%. 2. Clean washable surfaces with 5% bleach solution. 3. Remove sources e.g. Contaminated carpets.

## 2016-02-15 NOTE — Assessment & Plan Note (Signed)
   Treatment plan as outlined above for allergic rhinitis.  A prescription has been provided for Pazeo, one drop per eye daily as needed.  I have also recommended eye lubricant drops (i.e., Natural Tears) as needed. 

## 2016-02-15 NOTE — Assessment & Plan Note (Addendum)
The patient's history suggests allergic reaction with an unclear trigger versus asthma exacerbation. Food allergen skin tests were negative today despite a positive histamine control. The negative predictive value for skin tests is excellent (greater than 95%).   Should symptoms recur despite asthma being controlled, the patient has been asked to keep a journal to record any foods eaten, beverages consumed, medications taken within a 6 hour period prior to the onset of symptoms, as well as record activities being performed, and environmental conditions. For any symptoms concerning for anaphylaxis, epinephrine is to be administered and 911 is to be called immediately.  If this occurs, we will check labs to rule out other potential etiologies.

## 2016-02-15 NOTE — Assessment & Plan Note (Signed)
   A prescription has been provided for montelukast 10 mg daily at bedtime.  For now, continue Flovent 110 g, 2 inhalations twice a day.  To maximize pulmonary deposition, a spacer has been provided along with instructions for its proper administration with an HFA inhaler.  Subjective and objective measures of pulmonary function will be followed and the treatment plan will be adjusted accordingly.

## 2016-02-15 NOTE — Progress Notes (Signed)
New Patient Note  RE: Diane Wells MRN: [id number] DOB: 06-18-1980 Date of Office Visit: 02/15/2016  Referring provider: Pearline Cables, MD Primary care provider: Abbe Amsterdam, MD  Chief Complaint: Allergic Reaction; Asthma; and Allergic Rhinitis    History of present illness: Diane Wells is a 35 y.o. female seen today in consultation requested by Abbe Amsterdam, MD. She reports that she has had seasonal allergies for many years, typically requiring antihistamines on a daily basis during spring and fall.  However, over this past year her allergy symptoms have progressed.  She complains of nasal congestion, rhinorrhea, sneezing, postnasal drainage, nasal pruritus, ocular pruritus, and occasional sinus pressure.  She reports that she has had multiple sinus infections over the past 2 years, requiring antibiotics and/or steroids on 5 occasions.  She states that since late September of this year she has been experiencing coughing, dyspnea, and wheezing.  She went to the local urgent care where she was diagnosed with allergic asthma and started on a prednisone burst, Flovent, and albuterol as needed.  That night, she waking out of sleep with the sensation that her "throat was closed" and that she was unable to breathe.  She began to panic but forced herself to calm down, breathe slowly, and take albuterol after which the symptoms resolved.  The next night, as she was falling asleep, she had a similar episode.  She did not experience urticaria, angioedema, or GI symptoms.  She is unable to identify any specific triggers for the episodes and is uncertain if she was experiencing an allergic reaction or an exacerbation of asthma symptoms.  She was prescribed an EpiPen.  She reports that her asthma symptoms have improved significantly while taking Flovent.  She has multiple cats and dogs in her home which have access to her bedroom and she manages a veterinarian's office.   Assessment and  plan: Allergic reaction The patient's history suggests allergic reaction with an unclear trigger versus asthma exacerbation. Food allergen skin tests were negative today despite a positive histamine control. The negative predictive value for skin tests is excellent (greater than 95%).   Should symptoms recur despite asthma being controlled, the patient has been asked to keep a journal to record any foods eaten, beverages consumed, medications taken within a 6 hour period prior to the onset of symptoms, as well as record activities being performed, and environmental conditions. For any symptoms concerning for anaphylaxis, epinephrine is to be administered and 911 is to be called immediately.  If this occurs, we will check labs to rule out other potential etiologies.  Moderate persistent asthma  A prescription has been provided for montelukast 10 mg daily at bedtime.  For now, continue Flovent 110 g, 2 inhalations twice a day.  To maximize pulmonary deposition, a spacer has been provided along with instructions for its proper administration with an HFA inhaler.  Subjective and objective measures of pulmonary function will be followed and the treatment plan will be adjusted accordingly.  Perennial and seasonal allergic rhinitis  Aeroallergen avoidance measures have been discussed and provided in written form.  A prescription has been provided for levocetirizine, 5mg  daily as needed.  A prescription has been provided for Nasonex nasal spray, one spray per nostril 1-2 times daily as needed. Proper nasal spray technique has been discussed and demonstrated.  I have also recommended nasal saline spray (i.e., Simply Saline) or nasal saline lavage (i.e., NeilMed) as needed prior to medicated nasal sprays.  Montelukast has been prescribed (sees as  above).  The risks and benefits of aeroallergen immunotherapy have been discussed. The patient is motivated to initiate immunotherapy to reduce symptoms  and decrease medication requirement. Informed consent has been signed and allergen vaccine orders have been submitted. Medications will be decreased or discontinued as symptom relief from immunotherapy becomes evident.  Allergic conjunctivitis  Treatment plan as outlined above for allergic rhinitis.  A prescription has been provided for Pazeo, one drop per eye daily as needed.  I have also recommended eye lubricant drops (i.e., Natural Tears) as needed.   Meds ordered this encounter  Medications  . montelukast (SINGULAIR) 10 MG tablet    Sig: Take 1 tablet (10 mg total) by mouth at bedtime.    Dispense:  30 tablet    Refill:  5  . PROAIR HFA 108 (90 Base) MCG/ACT inhaler    Sig: Inhale 1 puff into the lungs daily.    Dispense:  1 Inhaler    Refill:  1    Place on hold patient will call  . FLOVENT HFA 110 MCG/ACT inhaler    Sig: Inhale 2 puffs into the lungs 2 (two) times daily.    Dispense:  1 Inhaler    Refill:  3  . Olopatadine HCl 0.2 % SOLN    Sig: Apply 1 drop to eye daily.    Dispense:  2.5 mL    Refill:  5  . mometasone (NASONEX) 50 MCG/ACT nasal spray    Sig: 1 spray each nostril 1-2 times daily    Dispense:  17 g    Refill:  5    Diagnostics: Spirometry: FVC is 3.68 L and FEV1 is 3.22 L with 230 mL (7%) post bronchodilator improvement.  This study was performed in the absence of lower respiratory symptoms.  Please see scanned spirometry results for details. Epicutaneous testing: Positive to grass pollens, weed pollens, tree pollens, molds, cat hair, mixed feathers, and dust mite antigen. Intradermal testing: Positive to ragweed pollen. Food allergen skin testing:  Negative despite a positive histamine control.     Physical examination: Blood pressure 126/80, pulse 80, temperature 97.7 F (36.5 C), temperature source Oral, resp. rate 16, height 5\' 6"  (1.676 m), weight 265 lb (120.2 kg).  General: Alert, interactive, in no acute distress. HEENT: TMs pearly gray,  turbinates edematous with clear discharge, post-pharynx moderately erythematous. Neck: Supple without lymphadenopathy. Lungs: Clear to auscultation without wheezing, rhonchi or rales. CV: Normal S1, S2 without murmurs. Abdomen: Nondistended, nontender. Skin: Warm and dry, without lesions or rashes. Extremities:  No clubbing, cyanosis or edema. Neuro:   Grossly intact.  Review of systems:  Review of systems negative except as noted in HPI / PMHx or noted below: Review of Systems  Constitutional: Negative.   HENT: Negative.   Eyes: Negative.   Respiratory: Negative.   Cardiovascular: Negative.   Gastrointestinal: Negative.   Genitourinary: Negative.   Musculoskeletal: Negative.   Skin: Negative.   Neurological: Negative.   Endo/Heme/Allergies: Negative.   Psychiatric/Behavioral: Negative.     Past medical history:  Past Medical History:  Diagnosis Date  . Asthma     Past surgical history:  Past Surgical History:  Procedure Laterality Date  . ADENOIDECTOMY    . ANTERIOR CERVICAL DECOMP/DISCECTOMY FUSION      Family history: Family History  Problem Relation Age of Onset  . Diabetes Mother   . Hypertension Mother   . Pulmonary Hypertension Mother   . Hyperthyroidism Mother   . Glaucoma Mother   . Neuropathy  Mother   . Sjogren's syndrome Mother   . Carpal tunnel syndrome Mother   . Hypertension Father   . Gallbladder disease Father   . Hypertension Maternal Grandmother   . Glaucoma Maternal Grandmother   . Coronary artery disease Maternal Grandmother   . Heart attack Maternal Grandmother   . Angina Maternal Grandmother   . Macular degeneration Maternal Grandmother   . COPD Maternal Grandmother   . Congestive Heart Failure Maternal Grandmother   . Leukemia Paternal Grandmother   . Hypertension Paternal Grandmother   . Colon cancer Paternal Grandfather   . Diabetes Maternal Uncle   . Heart attack Maternal Uncle   . Stroke Maternal Uncle   . Glaucoma Maternal  Grandfather   . Allergic rhinitis Neg Hx   . Angioedema Neg Hx   . Asthma Neg Hx   . Eczema Neg Hx     Social history: Social History   Social History  . Marital status: Single    Spouse name: N/A  . Number of children: N/A  . Years of education: N/A   Occupational History  . Not on file.   Social History Main Topics  . Smoking status: Never Smoker  . Smokeless tobacco: Not on file  . Alcohol use No  . Drug use: No  . Sexual activity: Not on file   Other Topics Concern  . Not on file   Social History Narrative  . No narrative on file   Environmental History: The patient lives in a 35 year old condominium with laminate floors throughout, gas heat, and central air.  There are dogs and cats in the house which have access to her bedroom.  She is a nonsmoker.    Medication List       Accurate as of 02/15/16  6:12 PM. Always use your most recent med list.          diphenhydrAMINE 25 mg capsule Commonly known as:  BENADRYL Take 25 mg by mouth every 6 (six) hours as needed.   EPIPEN 2-PAK 0.3 mg/0.3 mL Soaj injection Generic drug:  EPINEPHrine Inject into the muscle once.   FLOVENT HFA 110 MCG/ACT inhaler Generic drug:  fluticasone Inhale 2 puffs into the lungs 2 (two) times daily.   ibuprofen 200 MG tablet Commonly known as:  ADVIL,MOTRIN Take 200 mg by mouth every 6 (six) hours as needed.   mometasone 50 MCG/ACT nasal spray Commonly known as:  NASONEX 1 spray each nostril 1-2 times daily   montelukast 10 MG tablet Commonly known as:  SINGULAIR Take 1 tablet (10 mg total) by mouth at bedtime.   Olopatadine HCl 0.2 % Soln Apply 1 drop to eye daily.   PROAIR HFA 108 (90 Base) MCG/ACT inhaler Generic drug:  albuterol Inhale 1 puff into the lungs daily.       Known medication allergies: Allergies  Allergen Reactions  . Penicillins Hives    I appreciate the opportunity to take part in Diane Wells's care. Please do not hesitate to contact me with  questions.  Sincerely,   R. Jorene Guestarter Ardel Jagger, MD

## 2016-02-16 ENCOUNTER — Telehealth: Payer: Self-pay | Admitting: Allergy and Immunology

## 2016-02-16 NOTE — Telephone Encounter (Signed)
Advised pt to take an extra Xyzal and use hydrocortisone cream for itching.

## 2016-02-16 NOTE — Telephone Encounter (Signed)
Patient was seen yesterday, 02-15-16 by Dr. Nunzio Cobbs. She had skin testing done on her back and arm. Her back is fine and her arm was fine until today. She said she took Benedryl last night and this morning she has two spots on her arm that are bigger than a mosquito bite. She wants to know if this is normal and maybe a delayed reaction, or not.

## 2016-02-22 NOTE — Progress Notes (Signed)
Vials to be made 02-22-16.  jm

## 2016-02-23 DIAGNOSIS — J301 Allergic rhinitis due to pollen: Secondary | ICD-10-CM | POA: Diagnosis not present

## 2016-02-24 ENCOUNTER — Encounter: Payer: Self-pay | Admitting: Family Medicine

## 2016-02-24 ENCOUNTER — Other Ambulatory Visit (HOSPITAL_COMMUNITY)
Admission: RE | Admit: 2016-02-24 | Discharge: 2016-02-24 | Disposition: A | Discharge status: Home / Self Care | Payer: BLUE CROSS/BLUE SHIELD | Source: Ambulatory Visit | Attending: Family Medicine | Admitting: Family Medicine

## 2016-02-24 ENCOUNTER — Ambulatory Visit (INDEPENDENT_AMBULATORY_CARE_PROVIDER_SITE_OTHER): Payer: BLUE CROSS/BLUE SHIELD | Admitting: Family Medicine

## 2016-02-24 VITALS — BP 126/61 | HR 84 | Temp 98.1°F | Resp 16 | Ht 66.8 in | Wt 266.4 lb

## 2016-02-24 DIAGNOSIS — E538 Deficiency of other specified B group vitamins: Secondary | ICD-10-CM

## 2016-02-24 DIAGNOSIS — Z23 Encounter for immunization: Secondary | ICD-10-CM

## 2016-02-24 DIAGNOSIS — Z0000 Encounter for general adult medical examination without abnormal findings: Secondary | ICD-10-CM

## 2016-02-24 DIAGNOSIS — Z113 Encounter for screening for infections with a predominantly sexual mode of transmission: Secondary | ICD-10-CM | POA: Diagnosis not present

## 2016-02-24 DIAGNOSIS — Z01419 Encounter for gynecological examination (general) (routine) without abnormal findings: Secondary | ICD-10-CM | POA: Diagnosis not present

## 2016-02-24 DIAGNOSIS — Z124 Encounter for screening for malignant neoplasm of cervix: Secondary | ICD-10-CM | POA: Diagnosis not present

## 2016-02-24 DIAGNOSIS — D72829 Elevated white blood cell count, unspecified: Secondary | ICD-10-CM | POA: Diagnosis not present

## 2016-02-24 DIAGNOSIS — Z8639 Personal history of other endocrine, nutritional and metabolic disease: Secondary | ICD-10-CM

## 2016-02-24 DIAGNOSIS — E559 Vitamin D deficiency, unspecified: Secondary | ICD-10-CM

## 2016-02-24 DIAGNOSIS — J3089 Other allergic rhinitis: Secondary | ICD-10-CM | POA: Diagnosis not present

## 2016-02-24 DIAGNOSIS — Z1151 Encounter for screening for human papillomavirus (HPV): Secondary | ICD-10-CM | POA: Diagnosis not present

## 2016-02-24 LAB — CBC
HCT: 40.9 % (ref 36.0–46.0)
Hemoglobin: 13.8 g/dL (ref 12.0–15.0)
MCHC: 33.7 g/dL (ref 30.0–36.0)
MCV: 88.2 fl (ref 78.0–100.0)
Platelets: 288 10*3/uL (ref 150.0–400.0)
RBC: 4.64 Mil/uL (ref 3.87–5.11)
RDW: 13.8 % (ref 11.5–15.5)
WBC: 10.2 10*3/uL (ref 4.0–10.5)

## 2016-02-24 LAB — VITAMIN B12: Vitamin B-12: 362 pg/mL (ref 211–911)

## 2016-02-24 NOTE — Progress Notes (Signed)
Pre visit review using our clinic review tool, if applicable. No additional management support is needed unless otherwise documented below in the visit note. 

## 2016-02-24 NOTE — Patient Instructions (Addendum)
I will be in touch with your pap and other labs today- we will check your B12 and D levels, and repeat your CBC  Take care!     Td Vaccine (Tetanus and Diphtheria): What You Need to Know 1. Why get vaccinated? Tetanus  and diphtheria are very serious diseases. They are rare in the [place] today, but people who do become infected often have severe complications. Td vaccine is used to protect adolescents and adults from both of these diseases. Both tetanus and diphtheria are infections caused by bacteria. Diphtheria spreads from person to person through coughing or sneezing. Tetanus-causing bacteria enter the body through cuts, scratches, or wounds. TETANUS (lockjaw) causes painful muscle tightening and stiffness, usually all over the body.  It can lead to tightening of muscles in the head and neck so you can't open your mouth, swallow, or sometimes even breathe. Tetanus kills about 1 out of every 10 people who are infected even after receiving the best medical care. DIPHTHERIA can cause a thick coating to form in the back of the throat.  It can lead to breathing problems, paralysis, heart failure, and death. Before vaccines, as many as 200,000 cases of diphtheria and hundreds of cases of tetanus were reported in the [place] each year. Since vaccination began, reports of cases for both diseases have dropped by about 99%. 2. Td vaccine Td vaccine can protect adolescents and adults from tetanus and diphtheria. Td is usually given as a booster dose every 10 years but it can also be given earlier after a severe and dirty wound or burn. Another vaccine, called Tdap, which protects against pertussis in addition to tetanus and diphtheria, is sometimes recommended instead of Td vaccine. Your doctor or the person giving you the vaccine can give you more information. Td may safely be given at the same time as other vaccines. 3. Some people should not get this vaccine  A person who has ever had  a life-threatening allergic reaction after a previous dose of any tetanus or diphtheria containing vaccine, OR has a severe allergy to any part of this vaccine, should not get Td vaccine. Tell the person giving the vaccine about any severe allergies.  Talk to your doctor if you:  had severe pain or swelling after any vaccine containing diphtheria or tetanus,  ever had a condition called Guillain Barre Syndrome (GBS),  aren't feeling well on the day the shot is scheduled. 4. What are the risks from Td vaccine? With any medicine, including vaccines, there is a chance of side effects. These are usually mild and go away on their own. Serious reactions are also possible but are rare. Most people who get Td vaccine do not have any problems with it. Mild problems following Td vaccine: (Did not interfere with activities)  Pain where the shot was given (about 8 people in 10)  Redness or swelling where the shot was given (about 1 person in 4)  Mild fever (rare)  Headache (about 1 person in 4)  Tiredness (about 1 person in 4) Moderate problems following Td vaccine: (Interfered with activities, but did not require medical attention)  Fever over 102F (rare) Severe problems following Td vaccine: (Unable to perform usual activities; required medical attention)  Swelling, severe pain, bleeding and/or redness in the arm where the shot was given (rare). Problems that could happen after any vaccine:  People sometimes faint after a medical procedure, including vaccination. Sitting or lying down for about 15 minutes can help prevent fainting,  and injuries caused by a fall. Tell your doctor if you feel dizzy, or have vision changes or ringing in the ears.  Some people get severe pain in the shoulder and have difficulty moving the arm where a shot was given. This happens very rarely.  Any medication can cause a severe allergic reaction. Such reactions from a vaccine are very rare, estimated at fewer  than 1 in a million doses, and would happen within a few minutes to a few hours after the vaccination. As with any medicine, there is a very remote chance of a vaccine causing a serious injury or death. The safety of vaccines is always being monitored. For more information, visit: http://floyd.org/ 5. What if there is a serious reaction? What should I look for? Look for anything that concerns you, such as signs of a severe allergic reaction, very high fever, or unusual behavior. Signs of a severe allergic reaction can include hives, swelling of the face and throat, difficulty breathing, a fast heartbeat, dizziness, and weakness. These would usually start a few minutes to a few hours after the vaccination. What should I do?  If you think it is a severe allergic reaction or other emergency that can't wait, call 9-1-1 or get the person to the nearest hospital. Otherwise, call your doctor.  Afterward, the reaction should be reported to the Vaccine Adverse Event Reporting System (VAERS). Your doctor might file this report, or you can do it yourself through the VAERS web site at www.vaers.LAgents.no, or by calling 1-779-367-0465.  VAERS does not give medical advice. 6. The National Vaccine Injury Compensation Program The [employer] Vaccine Injury Compensation Program (VICP) is a federal program that was created to compensate people who may have been injured by certain vaccines. Persons who believe they may have been injured by a vaccine can learn about the program and about filing a claim by calling 1-(864)175-4935 or visiting the VICP website at SpiritualWord.at. There is a time limit to file a claim for compensation. 7. How can I learn more?  Ask your doctor. He or she can give you the vaccine package insert or suggest other sources of information.  Call your local or state health department.  Contact the Centers for Disease Control and Prevention (CDC):  Call 218-417-7273  (1-800-CDC-INFO)  Visit CDC's website at PicCapture.uy CDC Td Vaccine VIS (07/06/15) This information is not intended to replace advice given to you by your health care provider. Make sure you discuss any questions you have with your health care provider. Document Released: 01/08/2006 Document Revised: 12/02/2015 Document Reviewed: 12/02/2015 Elsevier Interactive Patient Education  2017 [employer].

## 2016-02-24 NOTE — Progress Notes (Addendum)
[facility] [address] [place], [place] [address] [phone]  Date:  02/24/2016   Name:  Diane Wells   DOB:  04/02/1980   MRN:  [id number]  PCP:  Abbe Amsterdam, MD    Chief Complaint: Annual Exam (with pap)   History of Present Illness:  Diane Wells is a 35 y.o. very pleasant female patient who presents with the following:  History of asthma, allergies, obesity Here today for a CP and pap Last pap done; approx 3-4 years ago.  She is not aware of any recent abnormality.   She has had ovarian cysts in the past but nothing recently   She had labs just lat month- they looked fine including CMP, lipids, A1c, TSH However her wbc count was high - likely due to recent steroid use. We will repeat this for her today   She has seen the allergist and has been started on some medications for allergy and asthma.  She is going to start immunotherapy next month.  She is allergic to cats, and several types of plants  She had to use her proair yesterday and today due to exposure to burning leaves.    No recent sexual activity LMP 02/18/16  Never had a DVT or PE, never had any cancer. She is not currently SA but would like to know if I can start OCP or patch for her later if she does become SA.  Advised that that would be fine with me  Patient Active Problem List   Diagnosis Date Noted  . Allergic reaction 02/15/2016  . Moderate persistent asthma 02/15/2016  . Perennial and seasonal allergic rhinitis 02/15/2016  . Allergic conjunctivitis 02/15/2016  . Obesity, unspecified obesity severity, unspecified obesity type 01/10/2016    Past Medical History:  Diagnosis Date  . Asthma     Past Surgical History:  Procedure Laterality Date  . ADENOIDECTOMY    . ANTERIOR CERVICAL DECOMP/DISCECTOMY FUSION      Social History  Substance Use Topics  . Smoking status: Never Smoker  . Smokeless tobacco: Not on file  .  Alcohol use No    Family History  Problem Relation Age of Onset  . Diabetes Mother   . Hypertension Mother   . Pulmonary Hypertension Mother   . Hyperthyroidism Mother   . Glaucoma Mother   . Neuropathy Mother   . Sjogren's syndrome Mother   . Carpal tunnel syndrome Mother   . Hypertension Father   . Gallbladder disease Father   . Hypertension Maternal Grandmother   . Glaucoma Maternal Grandmother   . Coronary artery disease Maternal Grandmother   . Heart attack Maternal Grandmother   . Angina Maternal Grandmother   . Macular degeneration Maternal Grandmother   . COPD Maternal Grandmother   . Congestive Heart Failure Maternal Grandmother   . Leukemia Paternal Grandmother   . Hypertension Paternal Grandmother   . Colon cancer Paternal Grandfather   . Diabetes Maternal Uncle   . Heart attack Maternal Uncle   . Stroke Maternal Uncle   . Glaucoma Maternal Grandfather   . Allergic rhinitis Neg Hx   . Angioedema Neg Hx   . Asthma Neg Hx   . Eczema Neg Hx     Allergies  Allergen Reactions  . Penicillins Hives    Medication list has been reviewed and updated.  Current Outpatient Prescriptions on File Prior to Visit  Medication Sig Dispense Refill  .  diphenhydrAMINE (BENADRYL) 25 mg capsule Take 25 mg by mouth every 6 (six) hours as needed.    Marland Kitchen EPINEPHrine (EPIPEN 2-PAK) 0.3 mg/0.3 mL IJ SOAJ injection Inject into the muscle once.    Marland Kitchen FLOVENT HFA 110 MCG/ACT inhaler Inhale 2 puffs into the lungs 2 (two) times daily. 1 Inhaler 3  . ibuprofen (ADVIL,MOTRIN) 200 MG tablet Take 200 mg by mouth every 6 (six) hours as needed.    . mometasone (NASONEX) 50 MCG/ACT nasal spray 1 spray each nostril 1-2 times daily 17 g 5  . montelukast (SINGULAIR) 10 MG tablet Take 1 tablet (10 mg total) by mouth at bedtime. 30 tablet 5  . Olopatadine HCl 0.2 % SOLN Apply 1 drop to eye daily. 2.5 mL 5  . PROAIR HFA 108 (90 Base) MCG/ACT inhaler Inhale 1 puff into the lungs daily. 1 Inhaler 1   No  current facility-administered medications on file prior to visit.     Review of Systems:  As per HPI- otherwise negative.   Physical Examination: Vitals:   02/24/16 1322  BP: 126/61  Pulse: 84  Resp: 16  Temp: 98.1 F (36.7 C)   Vitals:   02/24/16 1322  Weight: 266 lb 6.4 oz (120.8 kg)  Height: 5' 6.8" (1.697 m)   Body mass index is 41.97 kg/m. Ideal Body Weight: Weight in (lb) to have BMI = 25: 158.3  GEN: WDWN, NAD, Non-toxic, A & O x 3, obese, looks well HEENT: Atraumatic, Normocephalic. Neck supple. No masses, No LAD.  Bilateral TM wnl, oropharynx normal.  PEERL,EOMI.   Ears and Nose: No external deformity. CV: RRR, No M/G/R. No JVD. No thrill. No extra heart sounds. PULM: CTA B, no wheezes, crackles, rhonchi. No retractions. No resp. distress. No accessory muscle use. ABD: S, NT, ND. No rebound. No HSM. EXTR: No c/c/e NEURO Normal gait.  PSYCH: Normally interactive. Conversant. Not depressed or anxious appearing.  Calm demeanor.  Breast: normal exam, no masses/ dimpling/ discharge Pelvic: normal, no vaginal lesions or discharge. Uterus normal, no CMT, no adnexal tendereness or masses  Assessment and Plan: Physical exam  Screening for cervical cancer - Plan: Cytology - PAP  B12 deficiency - Plan: B12  History of vitamin D deficiency - Plan: Vitamin D (25 hydroxy)  Leukocytosis, unspecified type - Plan: CBC  Immunization due - Plan: Td vaccine greater than or equal to 7yo preservative free IM  Here today for a CPE She thinks that her Tdap was given 8- 10 years ago and would like to boost now Await pap Repeat CBC due to leukocytosis at last level, likely due to recent prednisone use She has noted some fatigue- will check her B12 and D levels which have been low in the past   Signed Abbe Amsterdam, MD  12/1:  Results for orders placed or performed in visit on 02/24/16  Vitamin D (25 hydroxy)  Result Value Ref Range   VITD 17.69 (L) 30.00 - 100.00  ng/mL  CBC  Result Value Ref Range   WBC 10.2 4.0 - 10.5 K/uL   RBC 4.64 3.87 - 5.11 Mil/uL   Platelets 288.0 150.0 - 400.0 K/uL   Hemoglobin 13.8 12.0 - 15.0 g/dL   HCT 97.3 53.2 - 99.2 %   MCV 88.2 78.0 - 100.0 fl   MCHC 33.7 30.0 - 36.0 g/dL   RDW 42.6 83.4 - 19.6 %  B12  Result Value Ref Range   Vitamin B-12 362 211 - 911 pg/mL   Leukocytosis  is resolved.  B12 normal.  Vitamin D is low- will rx the 50k weekly dose for 12 weeks.  mychart message to pt

## 2016-02-25 ENCOUNTER — Encounter: Payer: Self-pay | Admitting: Family Medicine

## 2016-02-25 LAB — VITAMIN D 25 HYDROXY (VIT D DEFICIENCY, FRACTURES): VITD: 17.69 ng/mL — ABNORMAL LOW (ref 30.00–100.00)

## 2016-02-25 MED ORDER — VITAMIN D (ERGOCALCIFEROL) 1.25 MG (50000 UNIT) PO CAPS: 50000.0000 [IU] | ORAL_CAPSULE | ORAL | 0 refills | Status: DC

## 2016-02-25 NOTE — Addendum Note (Signed)
Addended by: Abbe Amsterdam C on: 02/25/2016 04:19 PM   Modules accepted: Orders

## 2016-02-28 LAB — CYTOLOGY - PAP
Chlamydia: NEGATIVE
Diagnosis: NEGATIVE
HPV: NOT DETECTED
Neisseria Gonorrhea: NEGATIVE

## 2016-03-07 ENCOUNTER — Ambulatory Visit (INDEPENDENT_AMBULATORY_CARE_PROVIDER_SITE_OTHER): Payer: BLUE CROSS/BLUE SHIELD | Admitting: *Deleted

## 2016-03-07 DIAGNOSIS — J309 Allergic rhinitis, unspecified: Secondary | ICD-10-CM | POA: Diagnosis not present

## 2016-03-08 NOTE — Progress Notes (Signed)
Immunotherapy   Patient Details  Name: ANOKHI SHANNON MRN: [id number] Date of Birth: 30-Jun-1980  03/08/2016  Janne Lab started injections for  Mold-Mite-Cat & Karilyn Cota Following schedule: A  Frequency:1-2 times per week Epi-Pen:Epi-Pen Available  Consent signed and patient instructions given. No problems after 30 minutes in office.    Vella Redhead 03/08/2016, 8:56 AM

## 2016-03-10 ENCOUNTER — Ambulatory Visit (INDEPENDENT_AMBULATORY_CARE_PROVIDER_SITE_OTHER): Payer: BLUE CROSS/BLUE SHIELD | Admitting: *Deleted

## 2016-03-10 DIAGNOSIS — J309 Allergic rhinitis, unspecified: Secondary | ICD-10-CM

## 2016-03-15 ENCOUNTER — Ambulatory Visit (INDEPENDENT_AMBULATORY_CARE_PROVIDER_SITE_OTHER): Payer: BLUE CROSS/BLUE SHIELD

## 2016-03-15 DIAGNOSIS — J309 Allergic rhinitis, unspecified: Secondary | ICD-10-CM | POA: Diagnosis not present

## 2016-04-06 ENCOUNTER — Ambulatory Visit (INDEPENDENT_AMBULATORY_CARE_PROVIDER_SITE_OTHER): Payer: BLUE CROSS/BLUE SHIELD

## 2016-04-06 DIAGNOSIS — J309 Allergic rhinitis, unspecified: Secondary | ICD-10-CM | POA: Diagnosis not present

## 2016-04-20 ENCOUNTER — Ambulatory Visit (INDEPENDENT_AMBULATORY_CARE_PROVIDER_SITE_OTHER): Payer: BLUE CROSS/BLUE SHIELD

## 2016-04-20 DIAGNOSIS — T7840XA Allergy, unspecified, initial encounter: Secondary | ICD-10-CM

## 2016-05-04 ENCOUNTER — Ambulatory Visit (INDEPENDENT_AMBULATORY_CARE_PROVIDER_SITE_OTHER): Payer: BLUE CROSS/BLUE SHIELD | Admitting: *Deleted

## 2016-05-04 DIAGNOSIS — J309 Allergic rhinitis, unspecified: Secondary | ICD-10-CM | POA: Diagnosis not present

## 2016-05-09 ENCOUNTER — Encounter: Payer: Self-pay | Admitting: Allergy and Immunology

## 2016-05-09 ENCOUNTER — Ambulatory Visit (INDEPENDENT_AMBULATORY_CARE_PROVIDER_SITE_OTHER): Payer: BLUE CROSS/BLUE SHIELD | Admitting: Allergy and Immunology

## 2016-05-09 VITALS — BP 124/74 | HR 88 | Temp 97.5°F | Resp 16 | Ht 66.0 in | Wt 268.0 lb

## 2016-05-09 DIAGNOSIS — J4541 Moderate persistent asthma with (acute) exacerbation: Secondary | ICD-10-CM | POA: Diagnosis not present

## 2016-05-09 DIAGNOSIS — J3089 Other allergic rhinitis: Secondary | ICD-10-CM

## 2016-05-09 DIAGNOSIS — T7840XD Allergy, unspecified, subsequent encounter: Secondary | ICD-10-CM | POA: Diagnosis not present

## 2016-05-09 DIAGNOSIS — H1013 Acute atopic conjunctivitis, bilateral: Secondary | ICD-10-CM | POA: Diagnosis not present

## 2016-05-09 MED ORDER — BUDESONIDE-FORMOTEROL FUMARATE 160-4.5 MCG/ACT IN AERO: 2.0000 | INHALATION_SPRAY | Freq: Two times a day (BID) | RESPIRATORY_TRACT | 2 refills | Status: DC

## 2016-05-09 NOTE — Assessment & Plan Note (Signed)
Currently with suboptimal control.  Restart montelukast 10 mg daily bedtime.  A prescription has been provided for Symbicort (budesonide/formoterol) 160/4.5 g, 2 inhalations twice a day. To maximize pulmonary deposition, a spacer has been provided along with instructions for its proper administration with an HFA inhaler.  Continue albuterol HFA, 1-2 inhalations every 4-6 hours as needed.  The patient has been asked to contact me if her symptoms persist or progress. Otherwise, she may return for follow up in 2 months.

## 2016-05-09 NOTE — Assessment & Plan Note (Signed)
Stable.  Continue appropriate allergen avoidance measures, aeroallergen immunotherapy as prescribed and as tolerated, montelukast 10 mg daily bedtime, and levocetirizine as needed, and Nasonex as needed.  Medications will be decreased or discontinued as symptom relief from immunotherapy becomes evident.

## 2016-05-09 NOTE — Patient Instructions (Addendum)
Moderate persistent asthma Currently with suboptimal control.  Restart montelukast 10 mg daily bedtime.  A prescription has been provided for Symbicort (budesonide/formoterol) 160/4.5 g,  2 inhalations twice a day. To maximize pulmonary deposition, a spacer has been provided along with instructions for its proper administration with an HFA inhaler.  Continue albuterol HFA, 1-2 inhalations every 4-6 hours as needed.  The patient has been asked to contact me if her symptoms persist or progress. Otherwise, she may return for follow up in 2 months.  Perennial and seasonal allergic rhinitis Stable.  Continue appropriate allergen avoidance measures, aeroallergen immunotherapy as prescribed and as tolerated, montelukast 10 mg daily bedtime, and levocetirizine as needed, and Nasonex as needed.  Medications will be decreased or discontinued as symptom relief from immunotherapy becomes evident.  Allergic reaction  No recurrence since previous visit, this may have represented an asthma exacerbation.  Should significant symptoms recur or new symptoms occur, a journal is to be kept recording any foods eaten, beverages consumed, medications taken, activities performed, and environmental conditions within a 6 hour time period prior to the onset of symptoms. For any symptoms concerning for anaphylaxis, epinephrine is to be administered and 911 is to be called immediately.    Return in about 2 months (around 07/07/2016), or if symptoms worsen or fail to improve.

## 2016-05-09 NOTE — Assessment & Plan Note (Signed)
   No recurrence since previous visit, this may have represented an asthma exacerbation.  Should significant symptoms recur or new symptoms occur, a journal is to be kept recording any foods eaten, beverages consumed, medications taken, activities performed, and environmental conditions within a 6 hour time period prior to the onset of symptoms. For any symptoms concerning for anaphylaxis, epinephrine is to be administered and 911 is to be called immediately.

## 2016-05-09 NOTE — Progress Notes (Signed)
Follow-up Note  RE: Diane Wells MRN: [id number] DOB: 09-Jun-1980 Date of Office Visit: 05/09/2016  Primary care provider: Abbe Amsterdam, MD Referring provider: Pearline Cables, MD  History of present illness: Diane Wells is a 36 y.o. female with persistent asthma, allergic rhinoconjunctivitis, and history of allergic reaction presenting today for follow up.  She is previously seen in this clinic for her initial evaluation in November 2017.  She reports that over the past month she has required albuterol rescue several times per week.  She believes that increased frequency of coughing and wheezing have to do with the recent rapid weather changes.  She is using Flovent 110 g, 2 inhalations twice a day but reports that she is not using a spacer device and has only been taking montelukast sporadically.  She is receiving aeroallergen immunotherapy buildup injections without problems or complications.  She has no nasal symptom complaints today.  She has had no recurrence of allergic reactions.   Assessment and plan: Moderate persistent asthma Currently with suboptimal control.  Restart montelukast 10 mg daily bedtime.  A prescription has been provided for Symbicort (budesonide/formoterol) 160/4.5 g,  2 inhalations twice a day. To maximize pulmonary deposition, a spacer has been provided along with instructions for its proper administration with an HFA inhaler.  Continue albuterol HFA, 1-2 inhalations every 4-6 hours as needed.  The patient has been asked to contact me if her symptoms persist or progress. Otherwise, she may return for follow up in 2 months.  Perennial and seasonal allergic rhinitis Stable.  Continue appropriate allergen avoidance measures, aeroallergen immunotherapy as prescribed and as tolerated, montelukast 10 mg daily bedtime, and levocetirizine as needed, and Nasonex as needed.  Medications will be decreased or discontinued as symptom relief from  immunotherapy becomes evident.  Allergic reaction  No recurrence since previous visit, this may have represented an asthma exacerbation.  Should significant symptoms recur or new symptoms occur, a journal is to be kept recording any foods eaten, beverages consumed, medications taken, activities performed, and environmental conditions within a 6 hour time period prior to the onset of symptoms. For any symptoms concerning for anaphylaxis, epinephrine is to be administered and 911 is to be called immediately.    Meds ordered this encounter  Medications  . budesonide-formoterol (SYMBICORT) 160-4.5 MCG/ACT inhaler    Sig: Inhale 2 puffs into the lungs 2 (two) times daily.    Dispense:  1 Inhaler    Refill:  2    Diagnostics: Spirometry:  Normal with an FEV1 of 3.27 L.  Please see scanned spirometry results for details.    Physical examination: Blood pressure 124/74, pulse 88, temperature 97.5 F (36.4 C), temperature source Oral, resp. rate 16, height 5\' 6"  (1.676 m), weight 268 lb (121.6 kg), SpO2 98 %.  General: Alert, interactive, in no acute distress. HEENT: TMs pearly gray, turbinates minimally edematous without discharge, post-pharynx unremarkable. Neck: Supple without lymphadenopathy. Lungs: Clear to auscultation without wheezing, rhonchi or rales. CV: Normal S1, S2 without murmurs. Skin: Warm and dry, without lesions or rashes.  The following portions of the patient's history were reviewed and updated as appropriate: allergies, current medications, past family history, past medical history, past social history, past surgical history and problem list.  Allergies as of 05/09/2016      Reactions   Penicillins Hives      Medication List       Accurate as of 05/09/16  7:15 PM. Always use your most recent med list.  budesonide-formoterol 160-4.5 MCG/ACT inhaler Commonly known as:  SYMBICORT Inhale 2 puffs into the lungs 2 (two) times daily.   diphenhydrAMINE 25  mg capsule Commonly known as:  BENADRYL Take 25 mg by mouth every 6 (six) hours as needed.   EPIPEN 2-PAK 0.3 mg/0.3 mL Soaj injection Generic drug:  EPINEPHrine Inject into the muscle once.   FLOVENT HFA 110 MCG/ACT inhaler Generic drug:  fluticasone Inhale 2 puffs into the lungs 2 (two) times daily.   ibuprofen 200 MG tablet Commonly known as:  ADVIL,MOTRIN Take 200 mg by mouth every 6 (six) hours as needed.   mometasone 50 MCG/ACT nasal spray Commonly known as:  NASONEX 1 spray each nostril 1-2 times daily   montelukast 10 MG tablet Commonly known as:  SINGULAIR Take 1 tablet (10 mg total) by mouth at bedtime.   Olopatadine HCl 0.2 % Soln Apply 1 drop to eye daily.   PROAIR HFA 108 (90 Base) MCG/ACT inhaler Generic drug:  albuterol Inhale 1 puff into the lungs daily.       Allergies  Allergen Reactions  . Penicillins Hives   Review of systems: Review of systems negative except as noted in HPI / PMHx or noted below: Constitutional: Negative.  HENT: Negative.   Eyes: Negative.  Respiratory: Negative.   Cardiovascular: Negative.  Gastrointestinal: Negative.  Genitourinary: Negative.  Musculoskeletal: Negative.  Neurological: Negative.  Endo/Heme/Allergies: Negative.  Cutaneous: Negative.  Past Medical History:  Diagnosis Date  . Asthma   . Recurrent upper respiratory infection (URI)     Family History  Problem Relation Age of Onset  . Diabetes Mother   . Hypertension Mother   . Pulmonary Hypertension Mother   . Hyperthyroidism Mother   . Glaucoma Mother   . Neuropathy Mother   . Sjogren's syndrome Mother   . Carpal tunnel syndrome Mother   . Hypertension Father   . Gallbladder disease Father   . Hypertension Maternal Grandmother   . Glaucoma Maternal Grandmother   . Coronary artery disease Maternal Grandmother   . Heart attack Maternal Grandmother   . Angina Maternal Grandmother   . Macular degeneration Maternal Grandmother   . COPD Maternal  Grandmother   . Congestive Heart Failure Maternal Grandmother   . Leukemia Paternal Grandmother   . Hypertension Paternal Grandmother   . Colon cancer Paternal Grandfather   . Diabetes Maternal Uncle   . Heart attack Maternal Uncle   . Stroke Maternal Uncle   . Glaucoma Maternal Grandfather   . Allergic rhinitis Neg Hx   . Angioedema Neg Hx   . Asthma Neg Hx   . Eczema Neg Hx     Social History   Social History  . Marital status: Single    Spouse name: N/A  . Number of children: N/A  . Years of education: N/A   Occupational History  . Not on file.   Social History Main Topics  . Smoking status: Never Smoker  . Smokeless tobacco: Never Used  . Alcohol use No  . Drug use: No  . Sexual activity: Not on file   Other Topics Concern  . Not on file   Social History Narrative  . No narrative on file    I appreciate the opportunity to take part in Adayah's care. Please do not hesitate to contact me with questions.  Sincerely,   R. Jorene Guest, MD

## 2016-05-25 ENCOUNTER — Ambulatory Visit (INDEPENDENT_AMBULATORY_CARE_PROVIDER_SITE_OTHER): Payer: Self-pay | Admitting: *Deleted

## 2016-05-25 DIAGNOSIS — J309 Allergic rhinitis, unspecified: Secondary | ICD-10-CM | POA: Diagnosis not present

## 2016-06-02 ENCOUNTER — Ambulatory Visit (INDEPENDENT_AMBULATORY_CARE_PROVIDER_SITE_OTHER): Payer: Self-pay

## 2016-06-02 DIAGNOSIS — J309 Allergic rhinitis, unspecified: Secondary | ICD-10-CM | POA: Diagnosis not present

## 2016-06-09 ENCOUNTER — Ambulatory Visit (INDEPENDENT_AMBULATORY_CARE_PROVIDER_SITE_OTHER): Payer: Self-pay | Admitting: *Deleted

## 2016-06-09 DIAGNOSIS — J309 Allergic rhinitis, unspecified: Secondary | ICD-10-CM | POA: Diagnosis not present

## 2016-06-11 ENCOUNTER — Other Ambulatory Visit: Payer: Self-pay | Admitting: Allergy and Immunology

## 2016-06-11 DIAGNOSIS — J4540 Moderate persistent asthma, uncomplicated: Secondary | ICD-10-CM

## 2016-06-11 DIAGNOSIS — J3089 Other allergic rhinitis: Secondary | ICD-10-CM

## 2016-07-10 ENCOUNTER — Ambulatory Visit: Payer: BLUE CROSS/BLUE SHIELD | Admitting: Allergy and Immunology

## 2016-07-18 ENCOUNTER — Ambulatory Visit: Payer: BLUE CROSS/BLUE SHIELD | Admitting: Allergy and Immunology

## 2017-01-27 ENCOUNTER — Encounter (HOSPITAL_BASED_OUTPATIENT_CLINIC_OR_DEPARTMENT_OTHER): Payer: Self-pay | Admitting: *Deleted

## 2017-01-27 ENCOUNTER — Emergency Department (HOSPITAL_BASED_OUTPATIENT_CLINIC_OR_DEPARTMENT_OTHER)
Admission: EM | Admit: 2017-01-27 | Discharge: 2017-01-27 | Disposition: A | Discharge status: Home / Self Care | Payer: BLUE CROSS/BLUE SHIELD | Attending: Emergency Medicine | Admitting: Emergency Medicine

## 2017-01-27 DIAGNOSIS — Z79899 Other long term (current) drug therapy: Secondary | ICD-10-CM | POA: Insufficient documentation

## 2017-01-27 DIAGNOSIS — Y9389 Activity, other specified: Secondary | ICD-10-CM | POA: Insufficient documentation

## 2017-01-27 DIAGNOSIS — W57XXXA Bitten or stung by nonvenomous insect and other nonvenomous arthropods, initial encounter: Secondary | ICD-10-CM | POA: Insufficient documentation

## 2017-01-27 DIAGNOSIS — Y999 Unspecified external cause status: Secondary | ICD-10-CM | POA: Insufficient documentation

## 2017-01-27 DIAGNOSIS — S50362A Insect bite (nonvenomous) of left elbow, initial encounter: Secondary | ICD-10-CM | POA: Insufficient documentation

## 2017-01-27 DIAGNOSIS — Y9289 Other specified places as the place of occurrence of the external cause: Secondary | ICD-10-CM | POA: Insufficient documentation

## 2017-01-27 DIAGNOSIS — L03114 Cellulitis of left upper limb: Secondary | ICD-10-CM | POA: Insufficient documentation

## 2017-01-27 DIAGNOSIS — J45909 Unspecified asthma, uncomplicated: Secondary | ICD-10-CM | POA: Insufficient documentation

## 2017-01-27 MED ORDER — CEPHALEXIN 500 MG PO CAPS: 1000.0000 mg | ORAL_CAPSULE | Freq: Two times a day (BID) | ORAL | 0 refills | Status: DC

## 2017-01-27 MED ORDER — SULFAMETHOXAZOLE-TRIMETHOPRIM 800-160 MG PO TABS: 2.0000 | ORAL_TABLET | Freq: Two times a day (BID) | ORAL | 0 refills | Status: AC

## 2017-01-27 NOTE — ED Provider Notes (Signed)
[facility] EMERGENCY DEPARTMENT Provider Note   CSN: [id number] Arrival date & time: 01/27/17  1645     History   Chief Complaint Chief Complaint  Patient presents with  . Insect Bite    HPI Diane Wells is a 36 y.o. female.  HPI Patient presents to the emergency department with a bug bite to the left upper arm.  The patient states she was on a cruise when she felt like she got bitten by some bugs and then 2 days later noticed that the area was red.  Patient states that there is some surrounding redness and increased warmth.  She states that she did not take any medications prior to arrival.  The patient denies chest pain, shortness of breath, headache,blurred vision, neck pain, fever, cough, weakness, numbness, dizziness, anorexia, edema, abdominal pain, nausea, vomiting, diarrhea, rash, back pain, dysuria, hematemesis, bloody stool, near syncope, or syncope. Past Medical History:  Diagnosis Date  . Asthma   . Recurrent upper respiratory infection (URI)     Patient Active Problem List   Diagnosis Date Noted  . Allergic reaction 02/15/2016  . Moderate persistent asthma 02/15/2016  . Perennial and seasonal allergic rhinitis 02/15/2016  . Allergic conjunctivitis 02/15/2016  . Obesity, unspecified obesity severity, unspecified obesity type 01/10/2016    Past Surgical History:  Procedure Laterality Date  . ADENOIDECTOMY    . ANTERIOR CERVICAL DECOMP/DISCECTOMY FUSION      OB History    No data available       Home Medications    Prior to Admission medications   Medication Sig Start Date End Date Taking? Authorizing Provider  budesonide-formoterol (SYMBICORT) 160-4.5 MCG/ACT inhaler Inhale 2 puffs into the lungs 2 (two) times daily. 05/09/16   Bobbitt, Heywood Iles, MD  diphenhydrAMINE (BENADRYL) 25 mg capsule Take 25 mg by mouth every 6 (six) hours as needed.    [provider]  EPINEPHrine (EPIPEN 2-PAK) 0.3 mg/0.3 mL IJ SOAJ injection Inject  into the muscle once.    [provider]  FLOVENT HFA 110 MCG/ACT inhaler Inhale 2 puffs into the lungs 2 (two) times daily. 02/15/16   Bobbitt, Heywood Iles, MD  ibuprofen (ADVIL,MOTRIN) 200 MG tablet Take 200 mg by mouth every 6 (six) hours as needed.    [provider]  mometasone (NASONEX) 50 MCG/ACT nasal spray 1 spray each nostril 1-2 times daily 02/15/16   Bobbitt, Heywood Iles, MD  montelukast (SINGULAIR) 10 MG tablet TAKE 1 TABLET (10 MG TOTAL) BY MOUTH AT BEDTIME. 06/12/16   Bobbitt, Heywood Iles, MD  Olopatadine HCl 0.2 % SOLN Apply 1 drop to eye daily. 02/15/16   Bobbitt, Heywood Iles, MD  PROAIR HFA 108 [phone] Base) MCG/ACT inhaler Inhale 1 puff into the lungs daily. Patient taking differently: Inhale 1 puff into the lungs daily as needed.  02/15/16   Bobbitt, Heywood Iles, MD    Family History Family History  Problem Relation Age of Onset  . Diabetes Mother   . Hypertension Mother   . Pulmonary Hypertension Mother   . Hyperthyroidism Mother   . Glaucoma Mother   . Neuropathy Mother   . Sjogren's syndrome Mother   . Carpal tunnel syndrome Mother   . Hypertension Father   . Gallbladder disease Father   . Hypertension Maternal Grandmother   . Glaucoma Maternal Grandmother   . Coronary artery disease Maternal Grandmother   . Heart attack Maternal Grandmother   . Angina Maternal Grandmother   . Macular degeneration Maternal Grandmother   .  COPD Maternal Grandmother   . Congestive Heart Failure Maternal Grandmother   . Leukemia Paternal Grandmother   . Hypertension Paternal Grandmother   . Colon cancer Paternal Grandfather   . Diabetes Maternal Uncle   . Heart attack Maternal Uncle   . Stroke Maternal Uncle   . Glaucoma Maternal Grandfather   . Allergic rhinitis Neg Hx   . Angioedema Neg Hx   . Asthma Neg Hx   . Eczema Neg Hx     Social History Social History  Substance Use Topics  . Smoking status: Never Smoker  . Smokeless tobacco: Never Used    . Alcohol use No     Allergies   Penicillins   Review of Systems Review of Systems All other systems negative except as documented in the HPI. All pertinent positives and negatives as reviewed in the HPI.  Physical Exam Updated Vital Signs BP (!) 159/87 (BP Location: Left Arm)   Pulse (!) 104   Temp 98.4 F (36.9 C) (Oral)   Resp 19   LMP 01/13/2017   SpO2 100%   Physical Exam  Constitutional: She is oriented to person, place, and time. She appears well-developed and well-nourished. No distress.  HENT:  Head: Normocephalic and atraumatic.  Eyes: Pupils are equal, round, and reactive to light.  Pulmonary/Chest: Effort normal.  Neurological: She is alert and oriented to person, place, and time.  Skin: Skin is warm and dry.     Psychiatric: She has a normal mood and affect.  Nursing note and vitals reviewed.    ED Treatments / Results  Labs (all labs ordered are listed, but only abnormal results are displayed) Labs Reviewed - No data to display  EKG  EKG Interpretation None       Radiology No results found.  Procedures Procedures (including critical care time)  Medications Ordered in ED Medications - No data to display   Initial Impression / Assessment and Plan / ED Course  I have reviewed the triage vital signs and the nursing notes.  Pertinent labs & imaging results that were available during my care of the patient were reviewed by me and considered in my medical decision making (see chart for details).     Patient is advised to return to the emergency department for any worsening in her condition.  I also advised her to follow-up Monday with her primary doctor for a recheck told to use warm compress around the area keep the area clean.  Patient agrees to the plan and all questions were answered  Final Clinical Impressions(s) / ED Diagnoses   Final diagnoses:  None    New Prescriptions New Prescriptions   No medications on file      Charlestine Night, PA-C 01/29/17 [id number]    Cathren Laine, MD 01/29/17 1044

## 2017-01-27 NOTE — Discharge Instructions (Signed)
Return here as needed.  Follow-up with your primary doctor.  If he notices any worsening in her condition.  Return here for reevaluation. If you notice any worsening in your condition return here for reevaluation.  Use warm compresses over the area.

## 2017-01-27 NOTE — ED Notes (Signed)
Pt discharged to home with family. NAD.  

## 2017-01-27 NOTE — ED Triage Notes (Signed)
Pt reports a bug bite to her left elbow 4-5 days ago. Redness, slight swelling noted without streaking.

## 2017-04-03 DIAGNOSIS — R030 Elevated blood-pressure reading, without diagnosis of hypertension: Secondary | ICD-10-CM | POA: Diagnosis not present

## 2017-04-03 DIAGNOSIS — M5412 Radiculopathy, cervical region: Secondary | ICD-10-CM | POA: Diagnosis not present

## 2017-04-03 DIAGNOSIS — M542 Cervicalgia: Secondary | ICD-10-CM | POA: Diagnosis not present

## 2017-04-03 DIAGNOSIS — Z6841 Body Mass Index (BMI) 40.0 and over, adult: Secondary | ICD-10-CM | POA: Diagnosis not present

## 2017-04-17 DIAGNOSIS — M5412 Radiculopathy, cervical region: Secondary | ICD-10-CM | POA: Diagnosis not present

## 2017-04-17 DIAGNOSIS — M4802 Spinal stenosis, cervical region: Secondary | ICD-10-CM | POA: Diagnosis not present

## 2017-04-19 DIAGNOSIS — M542 Cervicalgia: Secondary | ICD-10-CM | POA: Diagnosis not present

## 2017-04-21 NOTE — Progress Notes (Addendum)
[facility] at [facility] [address], [address] [place], [place] [address] [phone]  Date:  04/23/2017   Name:  Diane Wells   DOB:  Feb 10, 1981   MRN:  [id number]  PCP:  Pearline Cables, MD    Chief Complaint: Pain (Body with inflamation per patient)   History of Present Illness:  Diane Wells is a 37 y.o. very pleasant female patient who presents with the following:  Here today with concern of "multiple things, I don't know if they are related" She had a C6/7 disc operation/ fusion back in 2016. She has continued to have some nerve pains in her right arm This improved over a year or so, but has started to bother her more over the last 6-8 months She followed up with NSG 3 weeks ago- Dr. Wynetta Emery. He did an MRI of her cervical spine which looked ok  They are doing an EMG for her right arm which is the limb that bothers her the most  She also has noted muscle spasms sometimes in her left arm and in her legs (biltarelly)- more at the end of the day   I last saw her for a CPE in 01/2016 She feels better if she stands- sitting down is less comfortable. Sometimes if she sits down she has a hard time getting moving again as her legs will feel like they won't move She feels like her "bones are throbbing"  No fever noted  No night sweats She does not sleep well due to her neck and arm pain   She has generally been in good health otherwise Her mother does have juvenile diabetes, and there is history of non- diabetes related neuropathy in the family   No skin rashes noted  LMP was 12/28  She was taking gabapentin in the past, and also robaxin. It does seem to help, but her medication is old and she is about out of it.  Needs a refill if she is going to continue taking this  She also does have occasional headaches and has used fiorcet in the past- would like to have a refill of this   Patient Active Problem List   Diagnosis Date Noted   . Allergic reaction 02/15/2016  . Moderate persistent asthma 02/15/2016  . Perennial and seasonal allergic rhinitis 02/15/2016  . Allergic conjunctivitis 02/15/2016  . Obesity, unspecified obesity severity, unspecified obesity type 01/10/2016    Past Medical History:  Diagnosis Date  . Asthma   . Recurrent upper respiratory infection (URI)     Past Surgical History:  Procedure Laterality Date  . ADENOIDECTOMY    . ANTERIOR CERVICAL DECOMP/DISCECTOMY FUSION      Social History   Tobacco Use  . Smoking status: Never Smoker  . Smokeless tobacco: Never Used  Substance Use Topics  . Alcohol use: No  . Drug use: No    Family History  Problem Relation Age of Onset  . Diabetes Mother   . Hypertension Mother   . Pulmonary Hypertension Mother   . Hyperthyroidism Mother   . Glaucoma Mother   . Neuropathy Mother   . Sjogren's syndrome Mother   . Carpal tunnel syndrome Mother   . Hypertension Father   . Gallbladder disease Father   . Hypertension Maternal Grandmother   . Glaucoma Maternal Grandmother   . Coronary artery disease Maternal Grandmother   . Heart attack Maternal Grandmother   . Angina Maternal Grandmother   .  Macular degeneration Maternal Grandmother   . COPD Maternal Grandmother   . Congestive Heart Failure Maternal Grandmother   . Leukemia Paternal Grandmother   . Hypertension Paternal Grandmother   . Colon cancer Paternal Grandfather   . Diabetes Maternal Uncle   . Heart attack Maternal Uncle   . Stroke Maternal Uncle   . Glaucoma Maternal Grandfather   . Allergic rhinitis Neg Hx   . Angioedema Neg Hx   . Asthma Neg Hx   . Eczema Neg Hx     Allergies  Allergen Reactions  . Penicillins Hives  . Sulfa Antibiotics     Medication list has been reviewed and updated.  Current Outpatient Medications on File Prior to Visit  Medication Sig Dispense Refill  . diphenhydrAMINE (BENADRYL) 25 mg capsule Take 25 mg by mouth every 6 (six) hours as needed.     Marland Kitchen EPINEPHrine (EPIPEN 2-PAK) 0.3 mg/0.3 mL IJ SOAJ injection Inject into the muscle once.    Marland Kitchen FLOVENT HFA 110 MCG/ACT inhaler Inhale 2 puffs into the lungs 2 (two) times daily. 1 Inhaler 3  . ibuprofen (ADVIL,MOTRIN) 200 MG tablet Take 200 mg by mouth every 6 (six) hours as needed.    . mometasone (NASONEX) 50 MCG/ACT nasal spray 1 spray each nostril 1-2 times daily 17 g 5  . montelukast (SINGULAIR) 10 MG tablet TAKE 1 TABLET (10 MG TOTAL) BY MOUTH AT BEDTIME. 30 tablet 3  . Olopatadine HCl 0.2 % SOLN Apply 1 drop to eye daily. 2.5 mL 5  . PROAIR HFA 108 (90 Base) MCG/ACT inhaler Inhale 1 puff into the lungs daily. (Patient taking differently: Inhale 1 puff into the lungs daily as needed. ) 1 Inhaler 1  . budesonide-formoterol (SYMBICORT) 160-4.5 MCG/ACT inhaler Inhale 2 puffs into the lungs 2 (two) times daily. (Patient not taking: Reported on 04/23/2017) 1 Inhaler 2  . cephALEXin (KEFLEX) 500 MG capsule Take 2 capsules (1,000 mg total) by mouth 2 (two) times daily. (Patient not taking: Reported on 04/23/2017) 40 capsule 0   No current facility-administered medications on file prior to visit.     Review of Systems:  As per HPI- otherwise negative.   Physical Examination: Vitals:   04/23/17 1456  BP: (!) 126/91  Pulse: 90  Temp: 98.3 F (36.8 C)  SpO2: 100%   Vitals:   04/23/17 1456  Weight: 277 lb 6.4 oz (125.8 kg)  Height: 5\' 6"  (1.676 m)   Body mass index is 44.77 kg/m. Ideal Body Weight: Weight in (lb) to have BMI = 25: 154.6  GEN: WDWN, NAD, Non-toxic, A & O x 3 HEENT: Atraumatic, Normocephalic. Neck supple. No masses, No LAD.  Bilateral TM wnl, oropharynx normal.  PEERL,EOMI.   Ears and Nose: No external deformity. CV: RRR, No M/G/R. No JVD. No thrill. No extra heart sounds. PULM: CTA B, no wheezes, crackles, rhonchi. No retractions. No resp. distress. No accessory muscle use. ABD: S, NT, ND, +BS. No rebound. No HSM. EXTR: No c/c/e NEURO Normal gait.  PSYCH:  Normally interactive. Conversant. Not depressed or anxious appearing.  Calm demeanor.  Overweight, otherwise appears well She notes that the sensation in her right arm is slightly decreased but this is not new  Normal strength of all extremiites  Assessment and Plan: Muscle cramps - Plan: CBC, Comprehensive metabolic panel, TSH, Sedimentation rate, ANA, Rheumatoid Factor, C-reactive protein  Frequent headaches - Plan: butalbital-acetaminophen-caffeine (FIORICET, ESGIC) 50-325-40 MG tablet  Cervical disc disease - Plan: gabapentin (NEURONTIN) 300 MG capsule, methocarbamol (  ROBAXIN) 750 MG tablet  Neuropathy, arm, right - Plan: gabapentin (NEURONTIN) 300 MG capsule, methocarbamol (ROBAXIN) 750 MG tablet  Body aches  Labs pending as above Will have her restart on gabapentin over a few days She will check with Dr. Wynetta Emery that this won't interfere with her EMG testing Refilled her robaxin and also her fiorcet today  Signed Abbe Amsterdam, MD  Received her labs 1/29- message to pt  Your labs look ok- nothing to suggest an auto-immune disorder. This is good news of course but is not helpful in determining the cause of your symptoms  The only abnormality is that your white blood cell count is minimally elevated. This is likely benign variation, but I would recommend that we recheck this in a month or so to make sure it comes back to normal. I will order this for you as a lab visit only.   Please let me know if I can be of further assistance to you!    Results for orders placed or performed in visit on 04/23/17  CBC  Result Value Ref Range   WBC 11.2 (H) 4.0 - 10.5 K/uL   RBC 4.75 3.87 - 5.11 Mil/uL   Platelets 309.0 150.0 - 400.0 K/uL   Hemoglobin 14.5 12.0 - 15.0 g/dL   HCT 29.5 62.1 - 30.8 %   MCV 90.4 78.0 - 100.0 fl   MCHC 33.8 30.0 - 36.0 g/dL   RDW 65.7 84.6 - 96.2 %  Comprehensive metabolic panel  Result Value Ref Range   Sodium 137 135 - 145 mEq/L   Potassium 3.9 3.5 -  5.1 mEq/L   Chloride 100 96 - 112 mEq/L   CO2 27 19 - 32 mEq/L   Glucose, Bld 87 70 - 99 mg/dL   BUN 11 6 - 23 mg/dL   Creatinine, Ser 9.52 0.40 - 1.20 mg/dL   Total Bilirubin 0.4 0.2 - 1.2 mg/dL   Alkaline Phosphatase 53 39 - 117 U/L   AST 13 0 - 37 U/L   ALT 11 0 - 35 U/L   Total Protein 7.7 6.0 - 8.3 g/dL   Albumin 4.1 3.5 - 5.2 g/dL   Calcium 9.1 8.4 - 84.1 mg/dL   GFR 32.44 >01.02 mL/min  TSH  Result Value Ref Range   TSH 1.61 0.35 - 4.50 uIU/mL  Sedimentation rate  Result Value Ref Range   Sed Rate 17 0 - 20 mm/hr  ANA  Result Value Ref Range   Anit Nuclear Antibody(ANA) NEGATIVE NEGATIVE  Rheumatoid Factor  Result Value Ref Range   Rhuematoid fact SerPl-aCnc <14 <14 IU/mL  C-reactive protein  Result Value Ref Range   CRP 1.5 0.5 - 20.0 mg/dL

## 2017-04-23 ENCOUNTER — Encounter: Payer: Self-pay | Admitting: Family Medicine

## 2017-04-23 ENCOUNTER — Ambulatory Visit: Payer: BLUE CROSS/BLUE SHIELD | Admitting: Family Medicine

## 2017-04-23 VITALS — BP 126/91 | HR 90 | Temp 98.3°F | Ht 66.0 in | Wt 277.4 lb

## 2017-04-23 DIAGNOSIS — R519 Headache, unspecified: Secondary | ICD-10-CM

## 2017-04-23 DIAGNOSIS — M5090 Cervical disc disorder, unspecified, unspecified cervical region: Secondary | ICD-10-CM

## 2017-04-23 DIAGNOSIS — R252 Cramp and spasm: Secondary | ICD-10-CM | POA: Diagnosis not present

## 2017-04-23 DIAGNOSIS — D72829 Elevated white blood cell count, unspecified: Secondary | ICD-10-CM

## 2017-04-23 DIAGNOSIS — R52 Pain, unspecified: Secondary | ICD-10-CM

## 2017-04-23 DIAGNOSIS — R51 Headache: Secondary | ICD-10-CM | POA: Diagnosis not present

## 2017-04-23 DIAGNOSIS — G5691 Unspecified mononeuropathy of right upper limb: Secondary | ICD-10-CM | POA: Diagnosis not present

## 2017-04-23 MED ORDER — BUTALBITAL-APAP-CAFFEINE 50-325-40 MG PO TABS: 1.0000 | ORAL_TABLET | Freq: Four times a day (QID) | ORAL | 0 refills | Status: DC | PRN

## 2017-04-23 MED ORDER — GABAPENTIN 300 MG PO CAPS: 300.0000 mg | ORAL_CAPSULE | Freq: Three times a day (TID) | ORAL | 5 refills | Status: DC

## 2017-04-23 MED ORDER — METHOCARBAMOL 750 MG PO TABS: 750.0000 mg | ORAL_TABLET | Freq: Every evening | ORAL | 3 refills | Status: DC | PRN

## 2017-04-23 NOTE — Patient Instructions (Signed)
We will get labs for you today and I will be in touch with your reports asap  Please start back on the gabapentin- 1 pill day 1 2 pills day 2 3 pills day 3 Use the robaxin (muscle relaxer) as needed I also refilled your Fioricet for you to use as needed- remember this can make you drowsy, do not combine with your robaxin

## 2017-04-24 ENCOUNTER — Encounter: Payer: Self-pay | Admitting: Family Medicine

## 2017-04-24 LAB — C-REACTIVE PROTEIN: CRP: 1.5 mg/dL (ref 0.5–20.0)

## 2017-04-24 LAB — CBC
HCT: 42.9 % (ref 36.0–46.0)
Hemoglobin: 14.5 g/dL (ref 12.0–15.0)
MCHC: 33.8 g/dL (ref 30.0–36.0)
MCV: 90.4 fl (ref 78.0–100.0)
Platelets: 309 10*3/uL (ref 150.0–400.0)
RBC: 4.75 Mil/uL (ref 3.87–5.11)
RDW: 13.5 % (ref 11.5–15.5)
WBC: 11.2 10*3/uL — ABNORMAL HIGH (ref 4.0–10.5)

## 2017-04-24 LAB — COMPREHENSIVE METABOLIC PANEL
ALT: 11 U/L (ref 0–35)
AST: 13 U/L (ref 0–37)
Albumin: 4.1 g/dL (ref 3.5–5.2)
Alkaline Phosphatase: 53 U/L (ref 39–117)
BUN: 11 mg/dL (ref 6–23)
CO2: 27 mEq/L (ref 19–32)
Calcium: 9.1 mg/dL (ref 8.4–10.5)
Chloride: 100 mEq/L (ref 96–112)
Creatinine, Ser: 0.75 mg/dL (ref 0.40–1.20)
GFR: 92.79 mL/min (ref >60.00)
Glucose, Bld: 87 mg/dL (ref 70–99)
Potassium: 3.9 mEq/L (ref 3.5–5.1)
Sodium: 137 mEq/L (ref 135–145)
Total Bilirubin: 0.4 mg/dL (ref 0.2–1.2)
Total Protein: 7.7 g/dL (ref 6.0–8.3)

## 2017-04-24 LAB — SEDIMENTATION RATE: Sed Rate: 17 mm/hr (ref 0–20)

## 2017-04-24 LAB — ANA: Anti Nuclear Antibody(ANA): NEGATIVE

## 2017-04-24 LAB — TSH: TSH: 1.61 u[IU]/mL (ref 0.35–4.50)

## 2017-04-24 LAB — RHEUMATOID FACTOR: Rhuematoid fact SerPl-aCnc: <14 IU/mL (ref <14)

## 2017-04-24 NOTE — Addendum Note (Signed)
Addended by: Abbe Amsterdam C on: 04/24/2017 03:48 PM   Modules accepted: Orders

## 2017-04-30 DIAGNOSIS — M79601 Pain in right arm: Secondary | ICD-10-CM | POA: Diagnosis not present

## 2017-04-30 DIAGNOSIS — R200 Anesthesia of skin: Secondary | ICD-10-CM | POA: Diagnosis not present

## 2017-04-30 DIAGNOSIS — M542 Cervicalgia: Secondary | ICD-10-CM | POA: Diagnosis not present

## 2017-04-30 DIAGNOSIS — M79602 Pain in left arm: Secondary | ICD-10-CM | POA: Diagnosis not present

## 2017-05-10 DIAGNOSIS — M5013 Cervical disc disorder with radiculopathy, cervicothoracic region: Secondary | ICD-10-CM | POA: Diagnosis not present

## 2017-05-10 DIAGNOSIS — M542 Cervicalgia: Secondary | ICD-10-CM | POA: Diagnosis not present

## 2017-05-10 DIAGNOSIS — M62838 Other muscle spasm: Secondary | ICD-10-CM | POA: Diagnosis not present

## 2017-05-15 DIAGNOSIS — M5013 Cervical disc disorder with radiculopathy, cervicothoracic region: Secondary | ICD-10-CM | POA: Diagnosis not present

## 2017-05-15 DIAGNOSIS — M542 Cervicalgia: Secondary | ICD-10-CM | POA: Diagnosis not present

## 2017-05-15 DIAGNOSIS — M62838 Other muscle spasm: Secondary | ICD-10-CM | POA: Diagnosis not present

## 2017-06-14 DIAGNOSIS — M542 Cervicalgia: Secondary | ICD-10-CM | POA: Diagnosis not present

## 2017-10-02 DIAGNOSIS — M25511 Pain in right shoulder: Secondary | ICD-10-CM | POA: Diagnosis not present

## 2017-10-02 DIAGNOSIS — G8929 Other chronic pain: Secondary | ICD-10-CM | POA: Diagnosis not present

## 2017-10-02 DIAGNOSIS — G540 Brachial plexus disorders: Secondary | ICD-10-CM | POA: Diagnosis not present

## 2017-11-23 ENCOUNTER — Encounter: Payer: Self-pay | Admitting: Medical

## 2017-11-23 ENCOUNTER — Ambulatory Visit: Payer: BLUE CROSS/BLUE SHIELD | Admitting: Medical

## 2017-11-23 VITALS — BP 115/73 | HR 84 | Temp 98.1°F | Resp 16 | Ht 66.0 in | Wt 278.6 lb

## 2017-11-23 DIAGNOSIS — M25572 Pain in left ankle and joints of left foot: Secondary | ICD-10-CM

## 2017-11-23 MED ORDER — KETOROLAC TROMETHAMINE 60 MG/2ML IM SOLN
60.0000 mg | Freq: Once | INTRAMUSCULAR | Status: AC
  Administered 2017-11-23: 60 mg via INTRAMUSCULAR

## 2017-11-23 MED ORDER — DICLOFENAC SODIUM 75 MG PO TBEC: 75.0000 mg | DELAYED_RELEASE_TABLET | Freq: Two times a day (BID) | ORAL | 0 refills | Status: DC

## 2017-11-23 NOTE — Progress Notes (Signed)
Subjective:    Patient ID: GORGEOUS NEWLUN, female    DOB: [birth year], 37 y.o.   MRN: [id number]  HPI  Pt in with some left ankle pain. No trauma or fall. Pt states swelling at end of the day. Pain and swelling over past week. Ibuprofen, ice and elevating is helping. Pt tried to get appointment with sports med but can be seen until next Tuesday or Wednesday. But no appointment made by patient yet.  lmp- November 04, 2017.Marland Kitchen  Ankle feels better at rest.  Pt never had warmth over ankle area.   Review of Systems  Constitutional: Negative for chills and fever.  Respiratory: Negative for cough, shortness of breath and wheezing.   Cardiovascular: Negative for chest pain and palpitations.  Gastrointestinal: Negative for abdominal pain.  Musculoskeletal:       Ankle pain.  Skin: Negative for rash.  Hematological: Negative for adenopathy. Does not bruise/bleed easily.  Psychiatric/Behavioral: Negative for behavioral problems, confusion and sleep disturbance. The patient is not nervous/anxious.    Past Medical History:  Diagnosis Date  . Asthma   . Recurrent upper respiratory infection (URI)      Social History   Socioeconomic History  . Marital status: Single    Spouse name: Not on file  . Number of children: Not on file  . Years of education: Not on file  . Highest education level: Not on file  Occupational History  . Not on file  Social Needs  . Financial resource strain: Not on file  . Food insecurity:    Worry: Not on file    Inability: Not on file  . Transportation needs:    Medical: Not on file    Non-medical: Not on file  Tobacco Use  . Smoking status: Never Smoker  . Smokeless tobacco: Never Used  Substance and Sexual Activity  . Alcohol use: No  . Drug use: No  . Sexual activity: Not on file  Lifestyle  . Physical activity:    Days per week: Not on file    Minutes per session: Not on file  . Stress: Not on file  Relationships  . Social connections:   Talks on phone: Not on file    Gets together: Not on file    Attends religious service: Not on file    Active member of club or organization: Not on file    Attends meetings of clubs or organizations: Not on file    Relationship status: Not on file  . Intimate partner violence:    Fear of current or ex partner: Not on file    Emotionally abused: Not on file    Physically abused: Not on file    Forced sexual activity: Not on file  Other Topics Concern  . Not on file  Social History Narrative  . Not on file    Past Surgical History:  Procedure Laterality Date  . ADENOIDECTOMY    . ANTERIOR CERVICAL DECOMP/DISCECTOMY FUSION      Family History  Problem Relation Age of Onset  . Diabetes Mother   . Hypertension Mother   . Pulmonary Hypertension Mother   . Hyperthyroidism Mother   . Glaucoma Mother   . Neuropathy Mother   . Sjogren's syndrome Mother   . Carpal tunnel syndrome Mother   . Hypertension Father   . Gallbladder disease Father   . Hypertension Maternal Grandmother   . Glaucoma Maternal Grandmother   . Coronary artery disease Maternal Grandmother   . Heart  attack Maternal Grandmother   . Angina Maternal Grandmother   . Macular degeneration Maternal Grandmother   . COPD Maternal Grandmother   . Congestive Heart Failure Maternal Grandmother   . Leukemia Paternal Grandmother   . Hypertension Paternal Grandmother   . Colon cancer Paternal Grandfather   . Diabetes Maternal Uncle   . Heart attack Maternal Uncle   . Stroke Maternal Uncle   . Glaucoma Maternal Grandfather   . Allergic rhinitis Neg Hx   . Angioedema Neg Hx   . Asthma Neg Hx   . Eczema Neg Hx     Allergies  Allergen Reactions  . Penicillins Hives  . Sulfa Antibiotics     Current Outpatient Medications on File Prior to Visit  Medication Sig Dispense Refill  . budesonide-formoterol (SYMBICORT) 160-4.5 MCG/ACT inhaler Inhale 2 puffs into the lungs 2 (two) times daily. 1 Inhaler 2  .  butalbital-acetaminophen-caffeine (FIORICET, ESGIC) 50-325-40 MG tablet Take 1-2 tablets by mouth every 6 (six) hours as needed for headache. Max 6 per day 30 tablet 0  . diphenhydrAMINE (BENADRYL) 25 mg capsule Take 25 mg by mouth every 6 (six) hours as needed.    Marland Kitchen EPINEPHrine (EPIPEN 2-PAK) 0.3 mg/0.3 mL IJ SOAJ injection Inject into the muscle once.    Marland Kitchen FLOVENT HFA 110 MCG/ACT inhaler Inhale 2 puffs into the lungs 2 (two) times daily. 1 Inhaler 3  . gabapentin (NEURONTIN) 300 MG capsule Take 1 capsule (300 mg total) by mouth 3 (three) times daily. 90 capsule 5  . ibuprofen (ADVIL,MOTRIN) 200 MG tablet Take 200 mg by mouth every 6 (six) hours as needed.    . methocarbamol (ROBAXIN) 750 MG tablet Take 1 tablet (750 mg total) by mouth at bedtime as needed for muscle spasms. 30 tablet 3  . mometasone (NASONEX) 50 MCG/ACT nasal spray 1 spray each nostril 1-2 times daily 17 g 5  . montelukast (SINGULAIR) 10 MG tablet TAKE 1 TABLET (10 MG TOTAL) BY MOUTH AT BEDTIME. 30 tablet 3  . Olopatadine HCl 0.2 % SOLN Apply 1 drop to eye daily. 2.5 mL 5  . PROAIR HFA 108 (90 Base) MCG/ACT inhaler Inhale 1 puff into the lungs daily. (Patient taking differently: Inhale 1 puff into the lungs daily as needed. ) 1 Inhaler 1   No current facility-administered medications on file prior to visit.     BP 115/73   Pulse 84   Temp 98.1 F (36.7 C) (Oral)   Resp 16   Ht 5\' 6"  (1.676 m)   Wt 278 lb 9.6 oz (126.4 kg)   SpO2 100%   BMI 44.97 kg/m       Objective:   Physical Exam  General- No acute distress. Pleasant patient. Neck- Full range of motion, no jvd Lungs- Clear, even and unlabored. Heart- regular rate and rhythm. Neurologic- CNII- XII grossly intact.  Lower ext- calfs area symmetric.No swelling.  Left ankle- moderate swelling medial and lateral aspect. Faint pain over talofibular ligament.  Skin- faint bilateral mild sun burn lower tibia region. No warmth, no induration or tendernss(just past  weekend went to  [facility]) Pt states sunburn.      Assessment & Plan:  For your moderate to severe left ankle pain, we gave you Toradol 60 mg IM injection.  Also did send diclofenac NSAID prescription to your pharmacy.  Recommend starting that tomorrow afternoon approximately 24 hours after Toradol injection today.  Use Ace bandage daily as well as rest, elevation and ice.  Went ahead  and made referral to sports medicine.  Try to get you in on Tuesday or Wednesday of this coming week.  Offered and discussed potential benefits of x-ray today.  X-ray declined but sports medicine may order next week as well as do potential ultrasound.  Recommend calling late Tuesday morning or my charting for update on sports medicine referral if no one has contacted you by then.  Asked kim today to work on referral and notify pt of appointment today if possible.  Esperanza Richters, PA-C

## 2017-11-23 NOTE — Patient Instructions (Signed)
For your moderate to severe left ankle pain, we gave you Toradol 60 mg IM injection.  Also did send diclofenac NSAID prescription to your pharmacy.  Recommend starting that tomorrow afternoon approximately 24 hours after Toradol injection today.  Use Ace bandage daily as well as rest, elevation and ice.  Went ahead and made referral to sports medicine.  Try to get you in on Tuesday or Wednesday of this coming week.  Offered and discussed potential benefits of x-ray today.  X-ray declined but sports medicine may order next week as well as do potential ultrasound.  Recommend calling late Tuesday morning or my charting for update on sports medicine referral if no one has contacted you by then.

## 2017-11-27 ENCOUNTER — Ambulatory Visit: Payer: BLUE CROSS/BLUE SHIELD | Admitting: Family Medicine

## 2017-11-27 ENCOUNTER — Ambulatory Visit (INDEPENDENT_AMBULATORY_CARE_PROVIDER_SITE_OTHER): Payer: BLUE CROSS/BLUE SHIELD

## 2017-11-27 ENCOUNTER — Encounter: Payer: Self-pay | Admitting: Family Medicine

## 2017-11-27 VITALS — BP 124/74 | HR 84 | Ht 66.0 in | Wt 278.0 lb

## 2017-11-27 DIAGNOSIS — M25572 Pain in left ankle and joints of left foot: Secondary | ICD-10-CM

## 2017-11-27 NOTE — Assessment & Plan Note (Signed)
Has a component of hypermobility and likely ankle instability. No specific inciting event.  - ASO applied today  - counseled on HEP  - counseled on insoles and may benefit from custom orthotics.  - if no improvement consider xray and PT

## 2017-11-27 NOTE — Progress Notes (Signed)
Diane Wells - 37 y.o. female MRN [id number]  Date of birth: 1980-04-21  SUBJECTIVE:  Including CC & ROS.  Chief Complaint  Patient presents with  . Ankle Pain    Diane Wells is a 37 y.o. female that is presenting with left ankle pain. Ongoing for three weeks. Pain is located on the lateral malleolus. Pain is worse when standing and bearing weight on her ankle. Denies trauma or injury. She feels her ankle and knees are ankle. She works at [employer], counselling and stands daily. She has been taking Voltaren. Admits to some swelling. She has been wearing an ace bandage daily since being seen on 11/23/17.    Review of Systems  Constitutional: Negative for fever.  HENT: Negative for congestion.   Respiratory: Negative for cough.   Cardiovascular: Negative for chest pain.  Gastrointestinal: Negative for abdominal pain.  Musculoskeletal: Positive for gait problem.  Skin: Negative for color change.  Neurological: Negative for weakness.  Hematological: Negative for adenopathy.  Psychiatric/Behavioral: Negative for agitation.    HISTORY: Past Medical, Surgical, Social, and Family History Reviewed & Updated per EMR.   Pertinent Historical Findings include:  Past Medical History:  Diagnosis Date  . Asthma   . Recurrent upper respiratory infection (URI)     Past Surgical History:  Procedure Laterality Date  . ADENOIDECTOMY    . ANTERIOR CERVICAL DECOMP/DISCECTOMY FUSION      Allergies  Allergen Reactions  . Penicillins Hives  . Sulfa Antibiotics     Family History  Problem Relation Age of Onset  . Diabetes Mother   . Hypertension Mother   . Pulmonary Hypertension Mother   . Hyperthyroidism Mother   . Glaucoma Mother   . Neuropathy Mother   . Sjogren's syndrome Mother   . Carpal tunnel syndrome Mother   . Hypertension Father   . Gallbladder disease Father   . Hypertension Maternal Grandmother   . Glaucoma Maternal Grandmother   . Coronary artery disease Maternal  Grandmother   . Heart attack Maternal Grandmother   . Angina Maternal Grandmother   . Macular degeneration Maternal Grandmother   . COPD Maternal Grandmother   . Congestive Heart Failure Maternal Grandmother   . Leukemia Paternal Grandmother   . Hypertension Paternal Grandmother   . Colon cancer Paternal Grandfather   . Diabetes Maternal Uncle   . Heart attack Maternal Uncle   . Stroke Maternal Uncle   . Glaucoma Maternal Grandfather   . Allergic rhinitis Neg Hx   . Angioedema Neg Hx   . Asthma Neg Hx   . Eczema Neg Hx      Social History   Socioeconomic History  . Marital status: Single    Spouse name: Not on file  . Number of children: Not on file  . Years of education: Not on file  . Highest education level: Not on file  Occupational History  . Not on file  Social Needs  . Financial resource strain: Not on file  . Food insecurity:    Worry: Not on file    Inability: Not on file  . Transportation needs:    Medical: Not on file    Non-medical: Not on file  Tobacco Use  . Smoking status: Never Smoker  . Smokeless tobacco: Never Used  Substance and Sexual Activity  . Alcohol use: No  . Drug use: No  . Sexual activity: Not on file  Lifestyle  . Physical activity:    Days per week: Not on file  Minutes per session: Not on file  . Stress: Not on file  Relationships  . Social connections:    Talks on phone: Not on file    Gets together: Not on file    Attends religious service: Not on file    Active member of club or organization: Not on file    Attends meetings of clubs or organizations: Not on file    Relationship status: Not on file  . Intimate partner violence:    Fear of current or ex partner: Not on file    Emotionally abused: Not on file    Physically abused: Not on file    Forced sexual activity: Not on file  Other Topics Concern  . Not on file  Social History Narrative  . Not on file     PHYSICAL EXAM:  VS: BP 124/74 (BP Location: Left Arm,  Patient Position: Sitting, Cuff Size: Normal)   Pulse 84   Ht 5\' 6"  (1.676 m)   Wt 278 lb (126.1 kg)   SpO2 98%   BMI 44.87 kg/m  Physical Exam Gen: NAD, alert, cooperative with exam, well-appearing ENT: normal lips, normal nasal mucosa,  Eye: normal EOM, normal conjunctiva and lids CV:  no edema, +2 pedal pulses   Resp: no accessory muscle use, non-labored,   Skin: no rashes, no areas of induration  Neuro: normal tone, normal sensation to touch Psych:  normal insight, alert and oriented MSK:  Left ankle:  Excessive inversion  Negative anterior drawer  Able to rise up on tip toes  Medial subtalar tilt.  Neurovascularly intact   Limited ultrasound: left foot:  Normal appearing peroneal tendons  Normal insertion into the base of the 5th MT  Normal appearing 5th MT  Normal appearing ankle joint   Summary: normal exam.   Ultrasound and interpretation by , MD      ASSESSMENT & PLAN:   Acute left ankle pain Has a component of hypermobility and likely ankle instability. No specific inciting event.  - ASO applied today  - counseled on HEP  - counseled on insoles and may benefit from custom orthotics.  - if no improvement consider xray and PT

## 2017-11-27 NOTE — Patient Instructions (Signed)
Nice to meet you  Please try the brace  Please ice your ankle  Please try the exercises if your pain is less than 2/10 Please follow up with me in 3 weeks

## 2018-01-04 DIAGNOSIS — M25511 Pain in right shoulder: Secondary | ICD-10-CM | POA: Diagnosis not present

## 2018-01-04 DIAGNOSIS — M542 Cervicalgia: Secondary | ICD-10-CM | POA: Diagnosis not present

## 2018-01-16 DIAGNOSIS — M25561 Pain in right knee: Secondary | ICD-10-CM | POA: Diagnosis not present

## 2018-01-17 ENCOUNTER — Ambulatory Visit: Payer: BLUE CROSS/BLUE SHIELD | Admitting: Family Medicine

## 2018-01-17 ENCOUNTER — Ambulatory Visit (INDEPENDENT_AMBULATORY_CARE_PROVIDER_SITE_OTHER): Payer: BLUE CROSS/BLUE SHIELD

## 2018-01-17 ENCOUNTER — Encounter: Payer: Self-pay | Admitting: Family Medicine

## 2018-01-17 VITALS — BP 136/74 | HR 116 | Ht 66.0 in

## 2018-01-17 DIAGNOSIS — S83006A Unspecified dislocation of unspecified patella, initial encounter: Secondary | ICD-10-CM | POA: Insufficient documentation

## 2018-01-17 DIAGNOSIS — M25561 Pain in right knee: Secondary | ICD-10-CM

## 2018-01-17 MED ORDER — IBUPROFEN-FAMOTIDINE 800-26.6 MG PO TABS: 1.0000 | ORAL_TABLET | Freq: Three times a day (TID) | ORAL | 3 refills | Status: DC

## 2018-01-17 NOTE — Patient Instructions (Signed)
Sorry about your knee  Please try to keep it wrapped  Take the medicine for pain  I will call you with the results from today  Please see me back in 7-10 days.

## 2018-01-17 NOTE — Progress Notes (Signed)
Diane Wells - 37 y.o. female MRN [id number]  Date of birth: [birth year]  SUBJECTIVE:  Including CC & ROS.  Chief Complaint  Patient presents with  . Knee Injury    Diane Wells is a 37 y.o. female that is presenting with knee pain. She was flying back from [place] yesterday and twisted her knee getting into her seat. She felt a pop and sharp shooting pain. Located at her lateral aspect and posteriorly. Admits to swelling and tenderness, no redness. Difficulty bearing weight. Pain worsen with flexion and extension. She has been applying ice.   She went to Urgent care in [place] last night-was given crutches at a knee brace. No imaging was obtained.  I, Milas Kocher, CMA serving as a Neurosurgeon for [employer]    Review of Systems  Constitutional: Negative for fever.  HENT: Negative for congestion.   Respiratory: Negative for cough.   Cardiovascular: Negative for chest pain.  Gastrointestinal: Negative for abdominal pain.  Musculoskeletal: Positive for gait problem and joint swelling.  Skin: Negative for color change.  Neurological: Negative for weakness.  Hematological: Negative for adenopathy.  Psychiatric/Behavioral: Negative for agitation.    HISTORY: Past Medical, Surgical, Social, and Family History Reviewed & Updated per EMR.   Pertinent Historical Findings include:  Past Medical History:  Diagnosis Date  . Asthma   . Recurrent upper respiratory infection (URI)     Past Surgical History:  Procedure Laterality Date  . ADENOIDECTOMY    . ANTERIOR CERVICAL DECOMP/DISCECTOMY FUSION      Allergies  Allergen Reactions  . Penicillins Hives  . Sulfa Antibiotics     Family History  Problem Relation Age of Onset  . Diabetes Mother   . Hypertension Mother   . Pulmonary Hypertension Mother   . Hyperthyroidism Mother   . Glaucoma Mother   . Neuropathy Mother   . Sjogren's syndrome Mother   . Carpal tunnel syndrome Mother   . Hypertension Father   .  Gallbladder disease Father   . Hypertension Maternal Grandmother   . Glaucoma Maternal Grandmother   . Coronary artery disease Maternal Grandmother   . Heart attack Maternal Grandmother   . Angina Maternal Grandmother   . Macular degeneration Maternal Grandmother   . COPD Maternal Grandmother   . Congestive Heart Failure Maternal Grandmother   . Leukemia Paternal Grandmother   . Hypertension Paternal Grandmother   . Colon cancer Paternal Grandfather   . Diabetes Maternal Uncle   . Heart attack Maternal Uncle   . Stroke Maternal Uncle   . Glaucoma Maternal Grandfather   . Allergic rhinitis Neg Hx   . Angioedema Neg Hx   . Asthma Neg Hx   . Eczema Neg Hx      Social History   Socioeconomic History  . Marital status: Single    Spouse name: Not on file  . Number of children: Not on file  . Years of education: Not on file  . Highest education level: Not on file  Occupational History  . Not on file  Social Needs  . Financial resource strain: Not on file  . Food insecurity:    Worry: Not on file    Inability: Not on file  . Transportation needs:    Medical: Not on file    Non-medical: Not on file  Tobacco Use  . Smoking status: Never Smoker  . Smokeless tobacco: Never Used  Substance and Sexual Activity  . Alcohol use: No  . Drug use:  No  . Sexual activity: Not on file  Lifestyle  . Physical activity:    Days per week: Not on file    Minutes per session: Not on file  . Stress: Not on file  Relationships  . Social connections:    Talks on phone: Not on file    Gets together: Not on file    Attends religious service: Not on file    Active member of club or organization: Not on file    Attends meetings of clubs or organizations: Not on file    Relationship status: Not on file  . Intimate partner violence:    Fear of current or ex partner: Not on file    Emotionally abused: Not on file    Physically abused: Not on file    Forced sexual activity: Not on file    Other Topics Concern  . Not on file  Social History Narrative  . Not on file     PHYSICAL EXAM:  VS: BP 136/74   Pulse (!) 116   Ht 5\' 6"  (1.676 m)   SpO2 (!) 9%   BMI 44.87 kg/m  Physical Exam Gen: NAD, alert, cooperative with exam, well-appearing ENT: normal lips, normal nasal mucosa,  Eye: normal EOM, normal conjunctiva and lids CV:  no edema, +2 pedal pulses   Resp: no accessory muscle use, non-labored,  Skin: no rashes, no areas of induration  Neuro: normal tone, normal sensation to touch Psych:  normal insight, alert and oriented MSK:  Right knee:  Obvious effusion Limited flexion and extension secondary to pain  No joint line tenderness  Neurovascularly intact   Limited ultrasound: right knee:  Significant effusion in the suprapatellar pouch. Normal-appearing quadricep and patellar tendon. Normal-appearing medial lateral joint line   Summary: Severe effusion  Ultrasound and interpretation by , MD       ASSESSMENT & PLAN:   Acute pain of right knee Had no inciting event that would be suspect of patellar dislocation.  Has significant effusion with limited mobility today. -X-ray. -Provided compression with an Ace wrap - duexis  - f/u in one week to revaluate. May need to consider an MRI    The above documentation has been reviewed and is accurate and complete. Clare Gandy, MD 01/22/2018, 9:33 AM>

## 2018-01-22 ENCOUNTER — Telehealth: Payer: Self-pay | Admitting: Family Medicine

## 2018-01-22 NOTE — Assessment & Plan Note (Signed)
Had no inciting event that would be suspect of patellar dislocation.  Has significant effusion with limited mobility today. -X-ray. -Provided compression with an Ace wrap - duexis  - f/u in one week to revaluate. May need to consider an MRI

## 2018-01-22 NOTE — Telephone Encounter (Signed)
Pt returned call and message given to her from Dr. Jordan Likes regarding her xray report. Pt voiced understanding and has not questions at this time. She will be seeing him in the office this week.

## 2018-01-22 NOTE — Telephone Encounter (Signed)
Left VM for patient. If she calls back please have her speak with a nurse/CMA and inform that her xray doesn't show a fracture. The [facility] can report results to patient.   If any questions then please take the best time and phone number to call and I will try to call her back.   Myra Rude, MD [facility] 01/22/2018, 8:59 AM

## 2018-01-24 ENCOUNTER — Ambulatory Visit: Payer: BLUE CROSS/BLUE SHIELD | Admitting: Family Medicine

## 2018-01-24 DIAGNOSIS — M25561 Pain in right knee: Secondary | ICD-10-CM

## 2018-01-24 NOTE — Progress Notes (Signed)
Diane Wells - 37 y.o. female MRN [id number]  Date of birth: 29-Nov-1980  SUBJECTIVE:  Including CC & ROS.  Chief Complaint  Patient presents with  . Follow-up    less pain while straight, cannnot bend. Sharp, shooting pain is gone. Can put some weight on it.    Diane Wells is a 37 y.o. female that is presenting with right knee pain.  She feels like she has had repeated dislocations over the course of the weekend.  She has improvement in extension but her range of motion is still limited.  Pain has improved.  She is able to put more pressure and weight onto the knee.  She denies any giving way.  She does feel unstable at times..  Independent review of the right knee x-ray from 10/24 shows no acute abnormality and a mild effusion.   Review of Systems  Constitutional: Negative for fever.  HENT: Negative for congestion.   Respiratory: Negative for cough.   Cardiovascular: Negative for chest pain.  Gastrointestinal: Negative for abdominal pain.  Musculoskeletal: Positive for gait problem and joint swelling.  Skin: Negative for color change.  Neurological: Negative for weakness.  Hematological: Negative for adenopathy.  Psychiatric/Behavioral: Negative for agitation.    HISTORY: Past Medical, Surgical, Social, and Family History Reviewed & Updated per EMR.   Pertinent Historical Findings include:  Past Medical History:  Diagnosis Date  . Asthma   . Recurrent upper respiratory infection (URI)     Past Surgical History:  Procedure Laterality Date  . ADENOIDECTOMY    . ANTERIOR CERVICAL DECOMP/DISCECTOMY FUSION      Allergies  Allergen Reactions  . Penicillins Hives  . Sulfa Antibiotics     Family History  Problem Relation Age of Onset  . Diabetes Mother   . Hypertension Mother   . Pulmonary Hypertension Mother   . Hyperthyroidism Mother   . Glaucoma Mother   . Neuropathy Mother   . Sjogren's syndrome Mother   . Carpal tunnel syndrome Mother   . Hypertension  Father   . Gallbladder disease Father   . Hypertension Maternal Grandmother   . Glaucoma Maternal Grandmother   . Coronary artery disease Maternal Grandmother   . Heart attack Maternal Grandmother   . Angina Maternal Grandmother   . Macular degeneration Maternal Grandmother   . COPD Maternal Grandmother   . Congestive Heart Failure Maternal Grandmother   . Leukemia Paternal Grandmother   . Hypertension Paternal Grandmother   . Colon cancer Paternal Grandfather   . Diabetes Maternal Uncle   . Heart attack Maternal Uncle   . Stroke Maternal Uncle   . Glaucoma Maternal Grandfather   . Allergic rhinitis Neg Hx   . Angioedema Neg Hx   . Asthma Neg Hx   . Eczema Neg Hx      Social History   Socioeconomic History  . Marital status: Single    Spouse name: Not on file  . Number of children: Not on file  . Years of education: Not on file  . Highest education level: Not on file  Occupational History  . Not on file  Social Needs  . Financial resource strain: Not on file  . Food insecurity:    Worry: Not on file    Inability: Not on file  . Transportation needs:    Medical: Not on file    Non-medical: Not on file  Tobacco Use  . Smoking status: Never Smoker  . Smokeless tobacco: Never Used  Substance and  Sexual Activity  . Alcohol use: No  . Drug use: No  . Sexual activity: Not on file  Lifestyle  . Physical activity:    Days per week: Not on file    Minutes per session: Not on file  . Stress: Not on file  Relationships  . Social connections:    Talks on phone: Not on file    Gets together: Not on file    Attends religious service: Not on file    Active member of club or organization: Not on file    Attends meetings of clubs or organizations: Not on file    Relationship status: Not on file  . Intimate partner violence:    Fear of current or ex partner: Not on file    Emotionally abused: Not on file    Physically abused: Not on file    Forced sexual activity: Not  on file  Other Topics Concern  . Not on file  Social History Narrative  . Not on file     PHYSICAL EXAM:  VS: BP 128/82 (BP Location: Left Arm, Patient Position: Sitting, Cuff Size: Normal)   Pulse 84   Ht 5\' 6"  (1.676 m)   Wt 278 lb (126.1 kg)   SpO2 98%   BMI 44.87 kg/m  Physical Exam Gen: NAD, alert, cooperative with exam, well-appearing ENT: normal lips, normal nasal mucosa,  Eye: normal EOM, normal conjunctiva and lids CV:  no edema, +2 pedal pulses   Resp: no accessory muscle use, non-labored,  Skin: no rashes, no areas of induration  Neuro: normal tone, normal sensation to touch Psych:  normal insight, alert and oriented MSK:  Right knee: Obvious effusion. No tenderness to palpation of the quad or patellar tendon. Limited flexion and extension actively and passively. No instability with valgus or varus testing. Negative Lauchman's test. No pain with patellar grind No J sign  Neurovascularly intact     ASSESSMENT & PLAN:   Acute pain of right knee Seems most consistent with patellar dislocation.  Still has significant effusion.  Unable to fit into a brace due to thigh to calf ratio. -She will start physical therapy. -She has a compression sleeve that she can wear and will continue to use the crutch. -She will follow-up in 3 to 4 weeks.  If no improvement can consider aspiration or injection.  May need to consider an MRI to evaluate for any meniscal tear.

## 2018-01-24 NOTE — Assessment & Plan Note (Signed)
Seems most consistent with patellar dislocation.  Still has significant effusion.  Unable to fit into a brace due to thigh to calf ratio. -She will start physical therapy. -She has a compression sleeve that she can wear and will continue to use the crutch. -She will follow-up in 3 to 4 weeks.  If no improvement can consider aspiration or injection.  May need to consider an MRI to evaluate for any meniscal tear.

## 2018-01-24 NOTE — Patient Instructions (Signed)
Good to see you  Please start physical therapy  Please see me back in 3-4 weeks  Please follow up sooner if you want to try to drain the knee

## 2018-02-19 ENCOUNTER — Ambulatory Visit: Payer: BLUE CROSS/BLUE SHIELD | Admitting: Family Medicine

## 2018-02-19 ENCOUNTER — Encounter: Payer: Self-pay | Admitting: Family Medicine

## 2018-02-19 VITALS — BP 136/96 | HR 86 | Temp 98.2°F | Ht 66.0 in | Wt 280.6 lb

## 2018-02-19 DIAGNOSIS — M25561 Pain in right knee: Secondary | ICD-10-CM

## 2018-02-19 DIAGNOSIS — S83004D Unspecified dislocation of right patella, subsequent encounter: Secondary | ICD-10-CM

## 2018-02-19 NOTE — Patient Instructions (Signed)
Good to see you  You will get a call to schedule the MRI  Please continue ice and the physical therapy  I will call you back in with the results of the MRI  Happy Thanksgiving.

## 2018-02-19 NOTE — Assessment & Plan Note (Signed)
Effusion and pain are still present.  Her range of motion has improved but symptoms are still lingering -MRI to evaluate for any chondral damage to the patella with suspected patellar dislocation or any meniscal tear -Continue physical therapy. -Counseled on supportive care. -We will call with MRI results and follow-up will depend on that.

## 2018-02-19 NOTE — Progress Notes (Signed)
Diane Wells - 37 y.o. female MRN [id number]  Date of birth: [birth year]  SUBJECTIVE:  Including CC & ROS.  Chief Complaint  Patient presents with  . Follow-up    right knee pain improving , uncomfortable to use     Diane Wells is a 37 y.o. female that is following up for her right knee pain.  It was thought that she had a patellar dislocation about 5 weeks ago.  Her knee effusion has improved but is still present.  She has pain with going up or down stairs or getting up from a seated position.  Pain is mild to moderate.  She has been undergoing physical therapy.  She is not having to take any anti-inflammatories currently.  She feels some instability with the knee.  Independent review of the left knee x-ray from 10/24 shows a suprapatellar effusion  Review of Systems  Constitutional: Negative for fever.  HENT: Negative for congestion.   Cardiovascular: Negative for chest pain.  Gastrointestinal: Negative for abdominal distention.  Musculoskeletal: Positive for gait problem and joint swelling.  Skin: Negative for color change.  Neurological: Negative for weakness.  Hematological: Negative for adenopathy.  Psychiatric/Behavioral: Negative for agitation.    HISTORY: Past Medical, Surgical, Social, and Family History Reviewed & Updated per EMR.   Pertinent Historical Findings include:  Past Medical History:  Diagnosis Date  . Asthma   . Recurrent upper respiratory infection (URI)     Past Surgical History:  Procedure Laterality Date  . ADENOIDECTOMY    . ANTERIOR CERVICAL DECOMP/DISCECTOMY FUSION      Allergies  Allergen Reactions  . Penicillins Hives  . Sulfa Antibiotics     Family History  Problem Relation Age of Onset  . Diabetes Mother   . Hypertension Mother   . Pulmonary Hypertension Mother   . Hyperthyroidism Mother   . Glaucoma Mother   . Neuropathy Mother   . Sjogren's syndrome Mother   . Carpal tunnel syndrome Mother   . Hypertension Father   .  Gallbladder disease Father   . Hypertension Maternal Grandmother   . Glaucoma Maternal Grandmother   . Coronary artery disease Maternal Grandmother   . Heart attack Maternal Grandmother   . Angina Maternal Grandmother   . Macular degeneration Maternal Grandmother   . COPD Maternal Grandmother   . Congestive Heart Failure Maternal Grandmother   . Leukemia Paternal Grandmother   . Hypertension Paternal Grandmother   . Colon cancer Paternal Grandfather   . Diabetes Maternal Uncle   . Heart attack Maternal Uncle   . Stroke Maternal Uncle   . Glaucoma Maternal Grandfather   . Allergic rhinitis Neg Hx   . Angioedema Neg Hx   . Asthma Neg Hx   . Eczema Neg Hx      Social History   Socioeconomic History  . Marital status: Single    Spouse name: Not on file  . Number of children: Not on file  . Years of education: Not on file  . Highest education level: Not on file  Occupational History  . Not on file  Social Needs  . Financial resource strain: Not on file  . Food insecurity:    Worry: Not on file    Inability: Not on file  . Transportation needs:    Medical: Not on file    Non-medical: Not on file  Tobacco Use  . Smoking status: Never Smoker  . Smokeless tobacco: Never Used  Substance and Sexual Activity  .  Alcohol use: No  . Drug use: No  . Sexual activity: Not on file  Lifestyle  . Physical activity:    Days per week: Not on file    Minutes per session: Not on file  . Stress: Not on file  Relationships  . Social connections:    Talks on phone: Not on file    Gets together: Not on file    Attends religious service: Not on file    Active member of club or organization: Not on file    Attends meetings of clubs or organizations: Not on file    Relationship status: Not on file  . Intimate partner violence:    Fear of current or ex partner: Not on file    Emotionally abused: Not on file    Physically abused: Not on file    Forced sexual activity: Not on file    Other Topics Concern  . Not on file  Social History Narrative  . Not on file     PHYSICAL EXAM:  VS: BP (!) 136/96 (BP Location: Left Arm, Patient Position: Sitting, Cuff Size: Normal)   Pulse 86   Temp 98.2 F (36.8 C) (Oral)   Ht 5\' 6"  (1.676 m)   Wt 280 lb 9.6 oz (127.3 kg)   SpO2 98%   BMI 45.29 kg/m  Physical Exam Gen: NAD, alert, cooperative with exam, well-appearing ENT: normal lips, normal nasal mucosa,  Eye: normal EOM, normal conjunctiva and lids CV:  no edema, +2 pedal pulses   Resp: no accessory muscle use, non-labored,  Skin: no rashes, no areas of induration  Neuro: normal tone, normal sensation to touch Psych:  normal insight, alert and oriented MSK:  Right knee: Effusion still present. Limited extension to about 5 degrees. Limited flexion to about 110 degrees. Pain with McMurray's testing. Tenderness palpation of the medial and lateral joint line. Neurovascular intact      ASSESSMENT & PLAN:   Patellar dislocation Effusion and pain are still present.  Her range of motion has improved but symptoms are still lingering -MRI to evaluate for any chondral damage to the patella with suspected patellar dislocation or any meniscal tear -Continue physical therapy. -Counseled on supportive care. -We will call with MRI results and follow-up will depend on that.

## 2018-03-04 ENCOUNTER — Encounter: Payer: Self-pay | Admitting: Family Medicine

## 2018-03-07 NOTE — Telephone Encounter (Signed)
Patient will call [facility] and [facility] to see which location is cheaper and let us know where she prefers to have her MRI completed.

## 2018-03-14 ENCOUNTER — Telehealth: Payer: BLUE CROSS/BLUE SHIELD | Admitting: Physician Assistant

## 2018-03-14 DIAGNOSIS — B9789 Other viral agents as the cause of diseases classified elsewhere: Secondary | ICD-10-CM

## 2018-03-14 DIAGNOSIS — J019 Acute sinusitis, unspecified: Secondary | ICD-10-CM

## 2018-03-14 MED ORDER — IPRATROPIUM BROMIDE 0.03 % NA SOLN: 2.0000 | Freq: Two times a day (BID) | NASAL | 0 refills | Status: DC

## 2018-03-14 NOTE — Progress Notes (Signed)

## 2018-03-24 DIAGNOSIS — J019 Acute sinusitis, unspecified: Secondary | ICD-10-CM | POA: Diagnosis not present

## 2018-03-27 DIAGNOSIS — M0520 Rheumatoid vasculitis with rheumatoid arthritis of unspecified site: Secondary | ICD-10-CM | POA: Diagnosis present

## 2018-04-09 ENCOUNTER — Encounter: Payer: Self-pay | Admitting: Family Medicine

## 2018-04-09 ENCOUNTER — Ambulatory Visit: Payer: BLUE CROSS/BLUE SHIELD | Admitting: Family Medicine

## 2018-04-09 ENCOUNTER — Telehealth: Payer: Self-pay | Admitting: *Deleted

## 2018-04-09 DIAGNOSIS — S83004D Unspecified dislocation of right patella, subsequent encounter: Secondary | ICD-10-CM | POA: Diagnosis not present

## 2018-04-09 NOTE — Telephone Encounter (Signed)
Left message for patient to return call regarding MRI-she can call to schedule at [facility] imaging-left # [phone]

## 2018-04-09 NOTE — Patient Instructions (Signed)
Good to see you  Please continue to ice the knee  Please try the exercises  We will call you about getting the MRI scheduled  I will call you after the MRI is completed and resulted.

## 2018-04-09 NOTE — Progress Notes (Signed)
Diane Wells - 38 y.o. female MRN [id number]  Date of birth: 1980-11-09  SUBJECTIVE:  Including CC & ROS.  Chief Complaint  Patient presents with  . Knee Pain    right , seems to be causing plantar fasciaitis in both feet    Diane Wells is a 38 y.o. female that is following up for her right knee pain.  She had suggestions of a patellar dislocation a few months ago.  Was unable to get her MRI yet.  Still having pain.  Still feels uneasy when she walks.  Feels like her kneecap may go out to the side when she is on unstable ground or walks to the side.  Has swelling intermittently.  Has tried medications with limited improvement.  Has tried a brace.  Pain is localized to the knee.  Pain is intermittent.  Pain is moderate.   Review of Systems  Constitutional: Negative for fever.  HENT: Negative for congestion.   Respiratory: Negative for cough.   Cardiovascular: Negative for chest pain.  Gastrointestinal: Negative for abdominal pain.  Musculoskeletal: Positive for gait problem and joint swelling.  Skin: Negative for color change.    HISTORY: Past Medical, Surgical, Social, and Family History Reviewed & Updated per EMR.   Pertinent Historical Findings include:  Past Medical History:  Diagnosis Date  . Asthma   . Recurrent upper respiratory infection (URI)     Past Surgical History:  Procedure Laterality Date  . ADENOIDECTOMY    . ANTERIOR CERVICAL DECOMP/DISCECTOMY FUSION      Allergies  Allergen Reactions  . Penicillins Hives  . Sulfa Antibiotics     Family History  Problem Relation Age of Onset  . Diabetes Mother   . Hypertension Mother   . Pulmonary Hypertension Mother   . Hyperthyroidism Mother   . Glaucoma Mother   . Neuropathy Mother   . Sjogren's syndrome Mother   . Carpal tunnel syndrome Mother   . Hypertension Father   . Gallbladder disease Father   . Hypertension Maternal Grandmother   . Glaucoma Maternal Grandmother   . Coronary artery disease  Maternal Grandmother   . Heart attack Maternal Grandmother   . Angina Maternal Grandmother   . Macular degeneration Maternal Grandmother   . COPD Maternal Grandmother   . Congestive Heart Failure Maternal Grandmother   . Leukemia Paternal Grandmother   . Hypertension Paternal Grandmother   . Colon cancer Paternal Grandfather   . Diabetes Maternal Uncle   . Heart attack Maternal Uncle   . Stroke Maternal Uncle   . Glaucoma Maternal Grandfather   . Allergic rhinitis Neg Hx   . Angioedema Neg Hx   . Asthma Neg Hx   . Eczema Neg Hx      Social History   Socioeconomic History  . Marital status: Single    Spouse name: Not on file  . Number of children: Not on file  . Years of education: Not on file  . Highest education level: Not on file  Occupational History  . Not on file  Social Needs  . Financial resource strain: Not on file  . Food insecurity:    Worry: Not on file    Inability: Not on file  . Transportation needs:    Medical: Not on file    Non-medical: Not on file  Tobacco Use  . Smoking status: Never Smoker  . Smokeless tobacco: Never Used  Substance and Sexual Activity  . Alcohol use: No  . Drug use:  No  . Sexual activity: Not on file  Lifestyle  . Physical activity:    Days per week: Not on file    Minutes per session: Not on file  . Stress: Not on file  Relationships  . Social connections:    Talks on phone: Not on file    Gets together: Not on file    Attends religious service: Not on file    Active member of club or organization: Not on file    Attends meetings of clubs or organizations: Not on file    Relationship status: Not on file  . Intimate partner violence:    Fear of current or ex partner: Not on file    Emotionally abused: Not on file    Physically abused: Not on file    Forced sexual activity: Not on file  Other Topics Concern  . Not on file  Social History Narrative  . Not on file     PHYSICAL EXAM:  VS: BP 140/82   Pulse 95    Ht 5\' 6"  (1.676 m)   SpO2 100%   BMI 45.29 kg/m  Physical Exam Gen: NAD, alert, cooperative with exam, well-appearing ENT: normal lips, normal nasal mucosa,  Eye: normal EOM, normal conjunctiva and lids CV:  no edema, +2 pedal pulses   Resp: no accessory muscle use, non-labored,  Skin: no rashes, no areas of induration  Neuro: normal tone, normal sensation to touch Psych:  normal insight, alert and oriented MSK:  Right knee: Mild effusion. Limited extension to about 5 degrees. Limited flexion to about 110 degrees. Instability valgus varus stress testing. Minor J sign present. Positive McMurray's test. Neurovascular intact     ASSESSMENT & PLAN:   Patellar dislocation We will plan for the MRI to evaluate for any other intra-articular process such as a meniscal tear. -MRI of the right knee to evaluate for meniscal tear  -Counseled supportive care.  Encouraged to continue the exercises from physical therapy.

## 2018-04-09 NOTE — Assessment & Plan Note (Signed)
We will plan for the MRI to evaluate for any other intra-articular process such as a meniscal tear. -MRI of the right knee to evaluate for meniscal tear  -Counseled supportive care.  Encouraged to continue the exercises from physical therapy.

## 2018-04-13 ENCOUNTER — Ambulatory Visit
Admission: RE | Admit: 2018-04-13 | Discharge: 2018-04-13 | Disposition: A | Discharge status: Home / Self Care | Payer: BLUE CROSS/BLUE SHIELD | Source: Ambulatory Visit | Attending: Family Medicine | Admitting: Family Medicine

## 2018-04-13 DIAGNOSIS — S83004D Unspecified dislocation of right patella, subsequent encounter: Secondary | ICD-10-CM

## 2018-04-13 DIAGNOSIS — M25561 Pain in right knee: Secondary | ICD-10-CM | POA: Diagnosis not present

## 2018-04-16 ENCOUNTER — Telehealth: Payer: Self-pay | Admitting: Family Medicine

## 2018-04-16 NOTE — Telephone Encounter (Signed)
Spoke with patient about MRI.   Myra Rude, MD [facility] 04/16/2018, 1:48 PM

## 2018-04-16 NOTE — Telephone Encounter (Signed)
Left message to call back for results

## 2018-04-16 NOTE — Telephone Encounter (Signed)
Pt is returning call for imaging results.   [phone]

## 2018-04-16 NOTE — Telephone Encounter (Signed)
Left VM for patient. If she calls back please have her speak with a nurse/CMA and inform that her MRI shows an ACL tear. We can rehab this or consider surgery. I can talk to her more about it. The [facility] can report results to patient.   If any questions then please take the best time and phone number to call and I will try to call her back.   Myra Rude, MD [facility] 04/16/2018, 8:08 AM

## 2018-04-16 NOTE — Telephone Encounter (Signed)
Pt. Given results. Would like to talk to Dr. Jordan Likes. Works to day 11:30 - 6:00. Work number [phone].This is the  Best number to reach her today. Is in a meeting from 2-3 p.m. Cell number [phone].

## 2018-04-18 DIAGNOSIS — R21 Rash and other nonspecific skin eruption: Secondary | ICD-10-CM | POA: Diagnosis not present

## 2018-04-25 DIAGNOSIS — M25561 Pain in right knee: Secondary | ICD-10-CM | POA: Diagnosis not present

## 2018-04-25 DIAGNOSIS — M25562 Pain in left knee: Secondary | ICD-10-CM | POA: Diagnosis not present

## 2018-04-25 DIAGNOSIS — S83512D Sprain of anterior cruciate ligament of left knee, subsequent encounter: Secondary | ICD-10-CM | POA: Diagnosis not present

## 2018-04-25 DIAGNOSIS — M357 Hypermobility syndrome: Secondary | ICD-10-CM | POA: Diagnosis not present

## 2018-05-14 NOTE — Progress Notes (Signed)
[facility] [address], [address] [place], [place] [address] [phone]  Date:  05/16/2018   Name:  Diane Wells   DOB:  [birth year]   MRN:  [id number]  PCP:  Pearline Cables, MD    Chief Complaint: Annual Exam (med refills, flu shot) and Referral (dermatology referral)   History of Present Illness:  Diane Wells is a 38 y.o. very pleasant female patient who presents with the following:  Here today for annual physical exam History of allergies, moderate persistent asthma, history of cervical fusion in 2016 Her neurosurgeon is Dr. Wynetta Emery She also dislocated her right patella last year-she had an MRI which showed an ACL tear She is currently being treated by sports medicine, and is doing physical therapy  Pap: 01/2016.  Negative, does not need repeat today  Labs: 1 year ago, can repeat today. She is fasting  Immunizations: Flu, will give today  Her neck is doing ok- no issues here  Her asthma is under pretty good control She is on flovent BID which generally controls her symptoms She may use albuterol sporadically.  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  She wonders if we can freeze these today Finally, Dioselin notes that she has some skin changes underneath bilateral breasts.  She notes that she  will sometimes get some small abscesses in this area.  It sounds that she has mild hidradenitis suppurativa She does have very large breasts, and would be interested in pursuing a breast reduction  Finally, [name] is worried that her knee.  She notes that her feet are starting to hurt as she has been forced to alter her gait.  She wonders about an orthopedics referral, I think this is a fine idea Patient Active Problem List   Diagnosis Date Noted  . Patellar dislocation 01/17/2018  . Acute left ankle pain 11/27/2017  . Allergic reaction 02/15/2016  . Moderate persistent asthma 02/15/2016  . Perennial and seasonal allergic rhinitis 02/15/2016  . Allergic conjunctivitis 02/15/2016  . Obesity, unspecified obesity severity, unspecified obesity type 01/10/2016    Past Medical History:  Diagnosis Date  . Asthma   . Recurrent upper respiratory infection (URI)     Past Surgical History:  Procedure Laterality Date  . ADENOIDECTOMY    . ANTERIOR CERVICAL DECOMP/DISCECTOMY FUSION      Social History   Tobacco Use  . Smoking status: Never Smoker  . Smokeless tobacco: Never Used  Substance Use Topics  . Alcohol use: No  . Drug use: No    Family History  Problem Relation Age of Onset  . Diabetes Mother   . Hypertension Mother   . Pulmonary Hypertension Mother   . Hyperthyroidism Mother   . Glaucoma Mother   .  Neuropathy Mother   . Sjogren's syndrome Mother   . Carpal tunnel syndrome Mother   . Hypertension Father   . Gallbladder disease Father   . Hypertension Maternal Grandmother   . Glaucoma Maternal Grandmother   . Coronary artery disease Maternal Grandmother   . Heart attack Maternal Grandmother   . Angina Maternal Grandmother   . Macular degeneration Maternal Grandmother   . COPD Maternal Grandmother   . Congestive Heart Failure Maternal Grandmother   . Leukemia Paternal Grandmother   . Hypertension Paternal Grandmother   . Colon cancer Paternal Grandfather   .  Diabetes Maternal Uncle   . Heart attack Maternal Uncle   . Stroke Maternal Uncle   . Glaucoma Maternal Grandfather   . Allergic rhinitis Neg Hx   . Angioedema Neg Hx   . Asthma Neg Hx   . Eczema Neg Hx     Allergies  Allergen Reactions  . Penicillins Hives  . Sulfa Antibiotics     Medication list has been reviewed and updated.  Current Outpatient Medications on File Prior to Visit  Medication Sig Dispense Refill  . diphenhydrAMINE (BENADRYL) 25 mg capsule Take 25 mg by mouth every 6 (six) hours as needed.    Marland Kitchen EPINEPHrine (EPIPEN 2-PAK) 0.3 mg/0.3 mL IJ SOAJ injection Inject into the muscle once.    . Ibuprofen-Famotidine 800-26.6 MG TABS Take 1 tablet by mouth 3 (three) times daily. 90 tablet 3  . ipratropium (ATROVENT) 0.03 % nasal spray Place 2 sprays into both nostrils every 12 (twelve) hours. 30 mL 0  . mometasone (NASONEX) 50 MCG/ACT nasal spray 1 spray each nostril 1-2 times daily 17 g 5  . Olopatadine HCl 0.2 % SOLN Apply 1 drop to eye daily. 2.5 mL 5   No current facility-administered medications on file prior to visit.     Review of Systems:  As per HPI- otherwise negative. No fever chills, no chest pain or shortness of breath  Physical Examination: Vitals:   05/16/18 1012  BP: 118/82  Pulse: 83  Resp: 16  Temp: 97.9 F (36.6 C)  SpO2: 100%   Vitals:   05/16/18 1012  Weight: 274 lb (124.3 kg)  Height:  (1.676 m)   Body mass index is 44.22 kg/m. Ideal Body Weight: Weight in (lb) to have BMI = 25: 154.6  GEN: WDWN, NAD, Non-toxic, A & O x 3, obese, looks well HEENT: Atraumatic, Normocephalic. Neck supple. No masses, No LAD. Bilateral TM wnl, oropharynx normal.  PEERL,EOMI.   Ears and Nose: No external deformity. CV: RRR, No M/G/R. No JVD. No thrill. No extra heart sounds. PULM: CTA B, no wheezes, crackles, rhonchi. No retractions. No resp. distress. No accessory muscle use. ABD: S, NT, ND, +BS. No rebound. No HSM. EXTR: No c/c/e NEURO Normal  gait.  PSYCH: Normally interactive. Conversant. Not depressed or anxious appearing.  Calm demeanor.  She has very large breasts bilaterally, with some evidence of chronic scarring underneath the breasts.  I do not see any active infections at this time She has a cluster of warts on the right point of the elbow, and on the left index finger.  Also a few on the right wrist  Verbal consent obtained.  Treated all warts with liquid nitrogen x3 cycles each   Assessment and Plan: Physical exam  Screening for diabetes mellitus - Plan: Hemoglobin A1c, Comprehensive metabolic panel  Screening for deficiency anemia - Plan: CBC  Screening for hyperlipidemia - Plan: Lipid panel  Moderate  persistent asthma without complication - Plan: FLOVENT HFA 110 MCG/ACT inhaler, PROAIR HFA 108 (90 Base) MCG/ACT inhaler  Cervical disc disease - Plan: gabapentin (NEURONTIN) 300 MG capsule, methocarbamol (ROBAXIN) 750 MG tablet  Neuropathy, arm, right - Plan: gabapentin (NEURONTIN) 300 MG capsule, methocarbamol (ROBAXIN) 750 MG tablet  Frequent headaches - Plan: butalbital-acetaminophen-caffeine (FIORICET, ESGIC) 50-325-40 MG tablet  Rupture of anterior cruciate ligament of right knee, initial encounter - Plan: Ambulatory referral to Orthopedic Surgery  Macromastia - Plan: Ambulatory referral to Plastic Surgery  Screening for breast cancer - Plan: MM 3D SCREEN BREAST BILATERAL  Immunization due - Plan: Flu Vaccine QUAD 6+ mos PF IM (Fluarix Quad PF)  History of vitamin D deficiency - Plan: Vitamin D (25 hydroxy)  Viral warts, unspecified type  Here today for a physical, we will also do a modifier 25 office visit Routine labs pain as above Viral warts are treated, she is asked to let me know if these do not resolve.  We can certainly freeze them again Refilled her medications as above She is cautioned the gabapentin and Fioricet, used together can cause increased sedation  Referral to plastic surgery to  discuss breast reduction Referral to orthopedics for her knee injury Screening mammogram ordered, under 40 baseline  Meds ordered this encounter  Medications  . FLOVENT HFA 110 MCG/ACT inhaler    Sig: Inhale 2 puffs into the lungs 2 (two) times daily.    Dispense:  1 Inhaler    Refill:  12  . PROAIR HFA 108 (90 Base) MCG/ACT inhaler    Sig: Inhale 1 puff into the lungs daily as needed.    Dispense:  1 Inhaler    Refill:  12  . gabapentin (NEURONTIN) 300 MG capsule    Sig: Take 1 capsule (300 mg total) by mouth 3 (three) times daily.    Dispense:  90 capsule    Refill:  5  . methocarbamol (ROBAXIN) 750 MG tablet    Sig: Take 1 tablet (750 mg total) by mouth at bedtime as needed for muscle spasms.    Dispense:  30 tablet    Refill:  3  . butalbital-acetaminophen-caffeine (FIORICET, ESGIC) 50-325-40 MG tablet    Sig: Take 1-2 tablets by mouth every 6 (six) hours as needed for headache. Max 6 per day    Dispense:  30 tablet    Refill:  0     Signed Abbe AmsterdamJessica Damyon Mullane, MD  Received her labs as below, message to patient  Results for orders placed or performed in visit on 05/16/18  CBC  Result Value Ref Range   WBC 7.9 4.0 - 10.5 K/uL   RBC 4.47 3.87 - 5.11 Mil/uL   Platelets 329.0 150.0 - 400.0 K/uL   Hemoglobin 13.2 12.0 - 15.0 g/dL   HCT 16.139.2 09.636.0 - 04.546.0 %   MCV 87.8 78.0 - 100.0 fl   MCHC 33.7 30.0 - 36.0 g/dL   RDW 40.913.7 81.111.5 - 91.415.5 %  Hemoglobin A1c  Result Value Ref Range   Hgb A1c MFr Bld 5.0 4.6 - 6.5 %  Lipid panel  Result Value Ref Range   Cholesterol 177 0 - 200 mg/dL   Triglycerides 78.283.0 0.0 - 149.0 mg/dL   HDL 95.6252.00 >13.08>39.00 mg/dL   VLDL 65.716.6 0.0 - 84.640.0 mg/dL   LDL Cholesterol 962108 (H) 0 - 99 mg/dL   Total CHOL/HDL Ratio 3    NonHDL 124.85   Comprehensive metabolic panel  Result Value Ref Range   Sodium 139  135 - 145 mEq/L   Potassium 4.5 3.5 - 5.1 mEq/L   Chloride 103 96 - 112 mEq/L   CO2 28 19 - 32 mEq/L   Glucose, Bld 77 70 - 99 mg/dL   BUN 13 6 - 23  mg/dL   Creatinine, Ser 6.14 0.40 - 1.20 mg/dL   Total Bilirubin 0.5 0.2 - 1.2 mg/dL   Alkaline Phosphatase 51 39 - 117 U/L   AST 11 0 - 37 U/L   ALT 8 0 - 35 U/L   Total Protein 6.9 6.0 - 8.3 g/dL   Albumin 3.8 3.5 - 5.2 g/dL   Calcium 8.8 8.4 - 43.1 mg/dL   GFR 54.00 >86.76 mL/min  Vitamin D (25 hydroxy)  Result Value Ref Range   VITD 11.16 (L) 30.00 - 100.00 ng/mL

## 2018-05-16 ENCOUNTER — Encounter: Payer: Self-pay | Admitting: Family Medicine

## 2018-05-16 ENCOUNTER — Ambulatory Visit (INDEPENDENT_AMBULATORY_CARE_PROVIDER_SITE_OTHER): Payer: BLUE CROSS/BLUE SHIELD | Admitting: Family Medicine

## 2018-05-16 VITALS — BP 118/82 | HR 83 | Temp 97.9°F | Resp 16 | Ht 66.0 in | Wt 274.0 lb

## 2018-05-16 DIAGNOSIS — Z8639 Personal history of other endocrine, nutritional and metabolic disease: Secondary | ICD-10-CM | POA: Diagnosis not present

## 2018-05-16 DIAGNOSIS — Z130 Encounter for screening for diseases of the blood and blood-forming organs and certain disorders involving the immune mechanism: Secondary | ICD-10-CM | POA: Diagnosis not present

## 2018-05-16 DIAGNOSIS — R51 Headache: Secondary | ICD-10-CM | POA: Diagnosis not present

## 2018-05-16 DIAGNOSIS — R519 Headache, unspecified: Secondary | ICD-10-CM

## 2018-05-16 DIAGNOSIS — Z131 Encounter for screening for diabetes mellitus: Secondary | ICD-10-CM

## 2018-05-16 DIAGNOSIS — Z13220 Encounter for screening for lipoid disorders: Secondary | ICD-10-CM | POA: Diagnosis not present

## 2018-05-16 DIAGNOSIS — Z23 Encounter for immunization: Secondary | ICD-10-CM

## 2018-05-16 DIAGNOSIS — S83511A Sprain of anterior cruciate ligament of right knee, initial encounter: Secondary | ICD-10-CM

## 2018-05-16 DIAGNOSIS — J4540 Moderate persistent asthma, uncomplicated: Secondary | ICD-10-CM

## 2018-05-16 DIAGNOSIS — Z0000 Encounter for general adult medical examination without abnormal findings: Secondary | ICD-10-CM | POA: Diagnosis not present

## 2018-05-16 DIAGNOSIS — N62 Hypertrophy of breast: Secondary | ICD-10-CM

## 2018-05-16 DIAGNOSIS — M5090 Cervical disc disorder, unspecified, unspecified cervical region: Secondary | ICD-10-CM | POA: Diagnosis not present

## 2018-05-16 DIAGNOSIS — B079 Viral wart, unspecified: Secondary | ICD-10-CM

## 2018-05-16 DIAGNOSIS — Z1239 Encounter for other screening for malignant neoplasm of breast: Secondary | ICD-10-CM

## 2018-05-16 DIAGNOSIS — G5691 Unspecified mononeuropathy of right upper limb: Secondary | ICD-10-CM | POA: Diagnosis not present

## 2018-05-16 LAB — COMPREHENSIVE METABOLIC PANEL
ALT: 8 U/L (ref 0–35)
AST: 11 U/L (ref 0–37)
Albumin: 3.8 g/dL (ref 3.5–5.2)
Alkaline Phosphatase: 51 U/L (ref 39–117)
BUN: 13 mg/dL (ref 6–23)
CO2: 28 mEq/L (ref 19–32)
Calcium: 8.8 mg/dL (ref 8.4–10.5)
Chloride: 103 mEq/L (ref 96–112)
Creatinine, Ser: 0.76 mg/dL (ref 0.40–1.20)
GFR: 85.48 mL/min (ref >60.00)
Glucose, Bld: 77 mg/dL (ref 70–99)
Potassium: 4.5 mEq/L (ref 3.5–5.1)
Sodium: 139 mEq/L (ref 135–145)
Total Bilirubin: 0.5 mg/dL (ref 0.2–1.2)
Total Protein: 6.9 g/dL (ref 6.0–8.3)

## 2018-05-16 LAB — CBC
HCT: 39.2 % (ref 36.0–46.0)
Hemoglobin: 13.2 g/dL (ref 12.0–15.0)
MCHC: 33.7 g/dL (ref 30.0–36.0)
MCV: 87.8 fl (ref 78.0–100.0)
Platelets: 329 10*3/uL (ref 150.0–400.0)
RBC: 4.47 Mil/uL (ref 3.87–5.11)
RDW: 13.7 % (ref 11.5–15.5)
WBC: 7.9 10*3/uL (ref 4.0–10.5)

## 2018-05-16 LAB — HEMOGLOBIN A1C: Hgb A1c MFr Bld: 5 % (ref 4.6–6.5)

## 2018-05-16 LAB — LIPID PANEL
Cholesterol: 177 mg/dL (ref 0–200)
HDL: 52 mg/dL (ref >39.00)
LDL Cholesterol: 108 mg/dL — ABNORMAL HIGH (ref 0–99)
NonHDL: 124.85
Total CHOL/HDL Ratio: 3
Triglycerides: 83 mg/dL (ref 0.0–149.0)
VLDL: 16.6 mg/dL (ref 0.0–40.0)

## 2018-05-16 LAB — VITAMIN D 25 HYDROXY (VIT D DEFICIENCY, FRACTURES): VITD: 11.16 ng/mL — ABNORMAL LOW (ref 30.00–100.00)

## 2018-05-16 MED ORDER — GABAPENTIN 300 MG PO CAPS: 300.0000 mg | ORAL_CAPSULE | Freq: Three times a day (TID) | ORAL | 5 refills | Status: DC

## 2018-05-16 MED ORDER — VITAMIN D3 1.25 MG (50000 UT) PO CAPS: ORAL_CAPSULE | ORAL | 0 refills | Status: DC

## 2018-05-16 MED ORDER — FLOVENT HFA 110 MCG/ACT IN AERO: 2.0000 | INHALATION_SPRAY | Freq: Two times a day (BID) | RESPIRATORY_TRACT | 12 refills | Status: DC

## 2018-05-16 MED ORDER — METHOCARBAMOL 750 MG PO TABS: 750.0000 mg | ORAL_TABLET | Freq: Every evening | ORAL | 3 refills | Status: DC | PRN

## 2018-05-16 MED ORDER — BUTALBITAL-APAP-CAFFEINE 50-325-40 MG PO TABS: 1.0000 | ORAL_TABLET | Freq: Four times a day (QID) | ORAL | 0 refills | Status: DC | PRN

## 2018-05-16 MED ORDER — PROAIR HFA 108 (90 BASE) MCG/ACT IN AERS: 1.0000 | INHALATION_SPRAY | Freq: Every day | RESPIRATORY_TRACT | 12 refills | Status: DC | PRN

## 2018-05-16 NOTE — Patient Instructions (Addendum)
I will be in touch with your labs, and we will set you up to see orthopedics about your knee and plastic surgery about a possible breast reduction operation I also ordered a screening mammogram for you to have done as a baseline  I refilled your medications today Remember that both gabapentin and fiorcet can cause you to feel sleepy, please use caution   Health Maintenance, Female Adopting a healthy lifestyle and getting preventive care can go a long way to promote health and wellness. Talk with your health care provider about what schedule of regular examinations is right for you. This is a good chance for you to check in with your provider about disease prevention and staying healthy. In between checkups, there are plenty of things you can do on your own. Experts have done a lot of research about which lifestyle changes and preventive measures are most likely to keep you healthy. Ask your health care provider for more information. Weight and diet Eat a healthy diet  Be sure to include plenty of vegetables, fruits, low-fat dairy products, and lean protein.  Do not eat a lot of foods high in solid fats, added sugars, or salt.  Get regular exercise. This is one of the most important things you can do for your health. ? Most adults should exercise for at least 150 minutes each week. The exercise should increase your heart rate and make you sweat (moderate-intensity exercise). ? Most adults should also do strengthening exercises at least twice a week. This is in addition to the moderate-intensity exercise. Maintain a healthy weight  Body mass index (BMI) is a measurement that can be used to identify possible weight problems. It estimates body fat based on height and weight. Your health care provider can help determine your BMI and help you achieve or maintain a healthy weight.  For females 42 years of age and older: ? A BMI below 18.5 is considered underweight. ? A BMI of 18.5 to 24.9 is  normal. ? A BMI of 25 to 29.9 is considered overweight. ? A BMI of 30 and above is considered obese. Watch levels of cholesterol and blood lipids  You should start having your blood tested for lipids and cholesterol at 38 years of age, then have this test every 5 years.  You may need to have your cholesterol levels checked more often if: ? Your lipid or cholesterol levels are high. ? You are older than 38 years of age. ? You are at high risk for heart disease. Cancer screening Lung Cancer  Lung cancer screening is recommended for adults 43-75 years old who are at high risk for lung cancer because of a history of smoking.  A yearly low-dose CT scan of the lungs is recommended for people who: ? Currently smoke. ? Have quit within the past 15 years. ? Have at least a 30-pack-year history of smoking. A pack year is smoking an average of one pack of cigarettes a day for 1 year.  Yearly screening should continue until it has been 15 years since you quit.  Yearly screening should stop if you develop a health problem that would prevent you from having lung cancer treatment. Breast Cancer  Practice breast self-awareness. This means understanding how your breasts normally appear and feel.  It also means doing regular breast self-exams. Let your health care provider know about any changes, no matter how small.  If you are in your 20s or 30s, you should have a clinical breast exam (CBE)  by a health care provider every 1-3 years as part of a regular health exam.  If you are 65 or older, have a CBE every year. Also consider having a breast X-ray (mammogram) every year.  If you have a family history of breast cancer, talk to your health care provider about genetic screening.  If you are at high risk for breast cancer, talk to your health care provider about having an MRI and a mammogram every year.  Breast cancer gene (BRCA) assessment is recommended for women who have family members with  BRCA-related cancers. BRCA-related cancers include: ? Breast. ? Ovarian. ? Tubal. ? Peritoneal cancers.  Results of the assessment will determine the need for genetic counseling and BRCA1 and BRCA2 testing. Cervical Cancer Your health care provider may recommend that you be screened regularly for cancer of the pelvic organs (ovaries, uterus, and vagina). This screening involves a pelvic examination, including checking for microscopic changes to the surface of your cervix (Pap test). You may be encouraged to have this screening done every 3 years, beginning at age 19.  For women ages 56-65, health care providers may recommend pelvic exams and Pap testing every 3 years, or they may recommend the Pap and pelvic exam, combined with testing for human papilloma virus (HPV), every 5 years. Some types of HPV increase your risk of cervical cancer. Testing for HPV may also be done on women of any age with unclear Pap test results.  Other health care providers may not recommend any screening for nonpregnant women who are considered low risk for pelvic cancer and who do not have symptoms. Ask your health care provider if a screening pelvic exam is right for you.  If you have had past treatment for cervical cancer or a condition that could lead to cancer, you need Pap tests and screening for cancer for at least 20 years after your treatment. If Pap tests have been discontinued, your risk factors (such as having a new sexual partner) need to be reassessed to determine if screening should resume. Some women have medical problems that increase the chance of getting cervical cancer. In these cases, your health care provider may recommend more frequent screening and Pap tests. Colorectal Cancer  This type of cancer can be detected and often prevented.  Routine colorectal cancer screening usually begins at 38 years of age and continues through 38 years of age.  Your health care provider may recommend screening at  an earlier age if you have risk factors for colon cancer.  Your health care provider may also recommend using home test kits to check for hidden blood in the stool.  A small camera at the end of a tube can be used to examine your colon directly (sigmoidoscopy or colonoscopy). This is done to check for the earliest forms of colorectal cancer.  Routine screening usually begins at age 47.  Direct examination of the colon should be repeated every 5-10 years through 38 years of age. However, you may need to be screened more often if early forms of precancerous polyps or small growths are found. Skin Cancer  Check your skin from head to toe regularly.  Tell your health care provider about any new moles or changes in moles, especially if there is a change in a mole's shape or color.  Also tell your health care provider if you have a mole that is larger than the size of a pencil eraser.  Always use sunscreen. Apply sunscreen liberally and repeatedly throughout the  day.  Protect yourself by wearing long sleeves, pants, a wide-brimmed hat, and sunglasses whenever you are outside. Heart disease, diabetes, and high blood pressure  High blood pressure causes heart disease and increases the risk of stroke. High blood pressure is more likely to develop in: ? People who have blood pressure in the high end of the normal range (130-139/85-89 mm Hg). ? People who are overweight or obese. ? People who are African American.  If you are 76-54 years of age, have your blood pressure checked every 3-5 years. If you are 88 years of age or older, have your blood pressure checked every year. You should have your blood pressure measured twice-once when you are at a hospital or clinic, and once when you are not at a hospital or clinic. Record the average of the two measurements. To check your blood pressure when you are not at a hospital or clinic, you can use: ? An automated blood pressure machine at a pharmacy. ? A  home blood pressure monitor.  If you are between 6 years and 41 years old, ask your health care provider if you should take aspirin to prevent strokes.  Have regular diabetes screenings. This involves taking a blood sample to check your fasting blood sugar level. ? If you are at a normal weight and have a low risk for diabetes, have this test once every three years after 38 years of age. ? If you are overweight and have a high risk for diabetes, consider being tested at a younger age or more often. Preventing infection Hepatitis B  If you have a higher risk for hepatitis B, you should be screened for this virus. You are considered at high risk for hepatitis B if: ? You were born in a country where hepatitis B is common. Ask your health care provider which countries are considered high risk. ? Your parents were born in a high-risk country, and you have not been immunized against hepatitis B (hepatitis B vaccine). ? You have HIV or AIDS. ? You use needles to inject street drugs. ? You live with someone who has hepatitis B. ? You have had sex with someone who has hepatitis B. ? You get hemodialysis treatment. ? You take certain medicines for conditions, including cancer, organ transplantation, and autoimmune conditions. Hepatitis C  Blood testing is recommended for: ? Everyone born from [birth year] through 1965. ? Anyone with known risk factors for hepatitis C. Sexually transmitted infections (STIs)  You should be screened for sexually transmitted infections (STIs) including gonorrhea and chlamydia if: ? You are sexually active and are younger than 38 years of age. ? You are older than 38 years of age and your health care provider tells you that you are at risk for this type of infection. ? Your sexual activity has changed since you were last screened and you are at an increased risk for chlamydia or gonorrhea. Ask your health care provider if you are at risk.  If you do not have HIV, but are  at risk, it may be recommended that you take a prescription medicine daily to prevent HIV infection. This is called pre-exposure prophylaxis (PrEP). You are considered at risk if: ? You are sexually active and do not regularly use condoms or know the HIV status of your partner(s). ? You take drugs by injection. ? You are sexually active with a partner who has HIV. Talk with your health care provider about whether you are at high risk of being infected  with HIV. If you choose to begin PrEP, you should first be tested for HIV. You should then be tested every 3 months for as long as you are taking PrEP. Pregnancy  If you are premenopausal and you may become pregnant, ask your health care provider about preconception counseling.  If you may become pregnant, take 400 to 800 micrograms (mcg) of folic acid every day.  If you want to prevent pregnancy, talk to your health care provider about birth control (contraception). Osteoporosis and menopause  Osteoporosis is a disease in which the bones lose minerals and strength with aging. This can result in serious bone fractures. Your risk for osteoporosis can be identified using a bone density scan.  If you are 23 years of age or older, or if you are at risk for osteoporosis and fractures, ask your health care provider if you should be screened.  Ask your health care provider whether you should take a calcium or vitamin D supplement to lower your risk for osteoporosis.  Menopause may have certain physical symptoms and risks.  Hormone replacement therapy may reduce some of these symptoms and risks. Talk to your health care provider about whether hormone replacement therapy is right for you. Follow these instructions at home:  Schedule regular health, dental, and eye exams.  Stay current with your immunizations.  Do not use any tobacco products including cigarettes, chewing tobacco, or electronic cigarettes.  If you are pregnant, do not drink  alcohol.  If you are breastfeeding, limit how much and how often you drink alcohol.  Limit alcohol intake to no more than 1 drink per day for nonpregnant women. One drink equals 12 ounces of beer, 5 ounces of wine, or 1 ounces of hard liquor.  Do not use street drugs.  Do not share needles.  Ask your health care provider for help if you need support or information about quitting drugs.  Tell your health care provider if you often feel depressed.  Tell your health care provider if you have ever been abused or do not feel safe at home. This information is not intended to replace advice given to you by your health care provider. Make sure you discuss any questions you have with your health care provider. Document Released: 09/26/2010 Document Revised: 08/19/2015 Document Reviewed: 12/15/2014 Elsevier Interactive Patient Education  2019 [employer].

## 2018-05-20 ENCOUNTER — Ambulatory Visit (HOSPITAL_BASED_OUTPATIENT_CLINIC_OR_DEPARTMENT_OTHER)
Admission: RE | Admit: 2018-05-20 | Discharge: 2018-05-20 | Disposition: A | Discharge status: Home / Self Care | Payer: BLUE CROSS/BLUE SHIELD | Source: Ambulatory Visit | Attending: Family Medicine | Admitting: Family Medicine

## 2018-05-20 ENCOUNTER — Encounter (HOSPITAL_BASED_OUTPATIENT_CLINIC_OR_DEPARTMENT_OTHER): Payer: Self-pay

## 2018-05-20 DIAGNOSIS — Z1239 Encounter for other screening for malignant neoplasm of breast: Secondary | ICD-10-CM

## 2018-05-20 DIAGNOSIS — Z1231 Encounter for screening mammogram for malignant neoplasm of breast: Secondary | ICD-10-CM | POA: Insufficient documentation

## 2018-05-21 ENCOUNTER — Other Ambulatory Visit: Payer: Self-pay | Admitting: Family Medicine

## 2018-05-21 DIAGNOSIS — R928 Other abnormal and inconclusive findings on diagnostic imaging of breast: Secondary | ICD-10-CM

## 2018-05-23 ENCOUNTER — Telehealth: Payer: Self-pay | Admitting: *Deleted

## 2018-05-23 NOTE — Telephone Encounter (Signed)
Received Physician Orders from [facility]; forwarded to provider/SLS 02/27   

## 2018-05-24 ENCOUNTER — Other Ambulatory Visit: Payer: Self-pay | Admitting: Family Medicine

## 2018-05-24 ENCOUNTER — Ambulatory Visit
Admission: RE | Admit: 2018-05-24 | Discharge: 2018-05-24 | Disposition: A | Discharge status: Home / Self Care | Payer: BLUE CROSS/BLUE SHIELD | Source: Ambulatory Visit | Attending: Family Medicine | Admitting: Family Medicine

## 2018-05-24 DIAGNOSIS — N6001 Solitary cyst of right breast: Secondary | ICD-10-CM | POA: Diagnosis not present

## 2018-05-24 DIAGNOSIS — N63 Unspecified lump in unspecified breast: Secondary | ICD-10-CM

## 2018-05-24 DIAGNOSIS — R928 Other abnormal and inconclusive findings on diagnostic imaging of breast: Secondary | ICD-10-CM

## 2018-06-06 ENCOUNTER — Telehealth: Payer: Self-pay | Admitting: Family Medicine

## 2018-06-06 NOTE — Telephone Encounter (Signed)
Copied from CRM [id number]. Topic: Quick Communication - See Telephone Encounter >> Jun 06, 2018 11:40 AM Debroah Loop wrote: CRM for notification. See Telephone encounter for: 06/06/18. Patient would like a call back from [place] or cma to discuss pain in both hands and legs. She says it's more like nerve pain and wants to re-evaluate what type of specialist she can be referred to? Work phone preferred then cell.

## 2018-06-07 NOTE — Telephone Encounter (Signed)
Should I call and offer patient appt to discuss first?

## 2018-06-07 NOTE — Telephone Encounter (Signed)
Sure, I think we would have to see her to figure out where to refer her.  Please call her and see if she is comfortable coming in for a visit next week

## 2018-06-08 NOTE — Progress Notes (Addendum)
[facility] [address] [place], [place] [address] [phone]  Date:  06/10/2018   Name:  Diane Wells   DOB:  1980/08/06   MRN:  [id number]  PCP:  Pearline Cables, MD    Chief Complaint: Referral (neurologist? nerve pain in legs and feet, toes and finger tips) and Lab Work (increased Economist)   History of Present Illness:  Diane Wells is a 38 y.o. very pleasant female patient who presents with the following:  Here today with concern of seeking lab evaluation for autoimmune disorder-I recently received the following message Patient would like a call back from  or cma to discuss pain in both hands and legs. She says it's more like nerve pain and wants to re-evaluate what type of specialist she can be referred to?   I saw her for a physical just last month History of cervical fusion in 2016 per Dr. Wynetta Emery  She works in [employer], is married to [place]  She is still waiting to see ortho about knee pain She is doing PT and feels like it is helping some with her knee sx  She notes "muscle cramps and shooting pain in my feet" - we had thought that this was due to compensating for her knee pain However she now has concern of shooting pains, muscle spasm in both hands and feet - she has noticed an issue with her feet for about 3 months Hand sx occurred last year and resolved, then came back about a week ago  She is not having numbness, but feels like her sensation might be a little bit less than is normal for her   She had an EMG on her right hand last year- this was normal    Robaxin prn- right now she is needing this 2-3x a day Also on gabapentin tid  Patient Active Problem List   Diagnosis Date Noted  . Patellar dislocation 01/17/2018  . Acute left ankle pain 11/27/2017  . Allergic reaction 02/15/2016  . Moderate persistent asthma 02/15/2016  . Perennial and seasonal allergic rhinitis  02/15/2016  . Allergic conjunctivitis 02/15/2016  . Obesity, unspecified obesity severity, unspecified obesity type 01/10/2016    Past Medical History:  Diagnosis Date  . Asthma   . Recurrent upper respiratory infection (URI)     Past Surgical History:  Procedure Laterality Date  . ADENOIDECTOMY    . ANTERIOR CERVICAL DECOMP/DISCECTOMY FUSION      Social History   Tobacco Use  . Smoking status: Never Smoker  . Smokeless tobacco: Never Used  Substance Use Topics  . Alcohol use: No  . Drug use: No    Family History  Problem Relation Age of Onset  . Diabetes Mother   . Hypertension Mother   . Pulmonary Hypertension Mother   . Hyperthyroidism Mother   . Glaucoma Mother   . Neuropathy Mother   . Sjogren's syndrome Mother   . Carpal tunnel syndrome Mother   . Hypertension Father   . Gallbladder disease Father   . Hypertension Maternal Grandmother   . Glaucoma Maternal Grandmother   . Coronary artery disease Maternal Grandmother   . Heart attack Maternal Grandmother   . Angina Maternal Grandmother   . Macular degeneration Maternal Grandmother   . COPD Maternal Grandmother   . Congestive Heart Failure Maternal Grandmother   . Leukemia Paternal Grandmother   . Hypertension Paternal Grandmother   . Colon cancer  Paternal Grandfather   . Diabetes Maternal Uncle   . Heart attack Maternal Uncle   . Stroke Maternal Uncle   . Glaucoma Maternal Grandfather   . Allergic rhinitis Neg Hx   . Angioedema Neg Hx   . Asthma Neg Hx   . Eczema Neg Hx     Allergies  Allergen Reactions  . Penicillins Hives  . Sulfa Antibiotics     Medication list has been reviewed and updated.  Current Outpatient Medications on File Prior to Visit  Medication Sig Dispense Refill  . butalbital-acetaminophen-caffeine (FIORICET, ESGIC) 50-325-40 MG tablet Take 1-2 tablets by mouth every 6 (six) hours as needed for headache. Max 6 per day 30 tablet 0  . Cholecalciferol (VITAMIN D3) 1.25 MG  (50000 UT) CAPS Take 1 weekly for 12 weeks 12 capsule 0  . diphenhydrAMINE (BENADRYL) 25 mg capsule Take 25 mg by mouth every 6 (six) hours as needed.    Marland Kitchen. EPINEPHrine (EPIPEN 2-PAK) 0.3 mg/0.3 mL IJ SOAJ injection Inject into the muscle once.    Marland Kitchen. FLOVENT HFA 110 MCG/ACT inhaler Inhale 2 puffs into the lungs 2 (two) times daily. 1 Inhaler 12  . gabapentin (NEURONTIN) 300 MG capsule Take 1 capsule (300 mg total) by mouth 3 (three) times daily. 90 capsule 5  . Ibuprofen-Famotidine 800-26.6 MG TABS Take 1 tablet by mouth 3 (three) times daily. 90 tablet 3  . ipratropium (ATROVENT) 0.03 % nasal spray Place 2 sprays into both nostrils every 12 (twelve) hours. 30 mL 0  . mometasone (NASONEX) 50 MCG/ACT nasal spray 1 spray each nostril 1-2 times daily 17 g 5  . Olopatadine HCl 0.2 % SOLN Apply 1 drop to eye daily. 2.5 mL 5  . PROAIR HFA 108 (90 Base) MCG/ACT inhaler Inhale 1 puff into the lungs daily as needed. 1 Inhaler 12   No current facility-administered medications on file prior to visit.     Review of Systems:  As per HPI- otherwise negative.   Physical Examination: Vitals:   06/10/18 1338  BP: 122/86  Pulse: 78  Resp: 18  Temp: (!) 97.4 F (36.3 C)  SpO2: 100%   Vitals:   06/10/18 1338  Weight: 269 lb (122 kg)  Height: 5\' 6"  (1.676 m)   Body mass index is 43.42 kg/m. Ideal Body Weight: Weight in (lb) to have BMI = 25: 154.6  GEN: WDWN, NAD, Non-toxic, A & O x 3, obese, looks well  HEENT: Atraumatic, Normocephalic. Neck supple. No masses, No LAD.  Bilateral TM wnl, oropharynx normal.  PEERL,EOMI.   Ears and Nose: No external deformity. CV: RRR, No M/G/R. No JVD. No thrill. No extra heart sounds. PULM: CTA B, no wheezes, crackles, rhonchi. No retractions. No resp. distress. No accessory muscle use. EXTR: No c/c/e NEURO Normal gait.  PSYCH: Normally interactive. Conversant. Not depressed or anxious appearing.  Calm demeanor.  Foot exam- normal DP pulse and monofilament  sensation bilaterally Normal DTR in UE and LE Normal strength except grip, biceps symmetrically slightly less than expected    Assessment and Plan: Muscle spasm - Plan: Sedimentation rate, Rheumatoid Factor, C-reactive protein, ANA, methocarbamol (ROBAXIN) 750 MG tablet, CK, Basic metabolic panel  Cervical disc disease - Plan: methocarbamol (ROBAXIN) 750 MG tablet  Neuropathy, arm, right - Plan: methocarbamol (ROBAXIN) 750 MG tablet  Here today with concern of muscle spasm and pain in her hands/ feet She is using her robaxin- sometimes needs more than 1 per day Refilled for her today- gave #90 due  to current COVID 19 outbreak and possible quarantine issue Labs pending as above-Will plan further follow- up pending labs.   Signed Abbe AmsterdamJessica , MD  Received her labs 3/17-   Message to pt - will refer to rheumatology   Results for orders placed or performed in visit on 06/10/18  Sedimentation rate  Result Value Ref Range   Sed Rate 49 (H) 0 - 20 mm/hr  Rheumatoid Factor  Result Value Ref Range   Rhuematoid fact SerPl-aCnc 79 (H) <14 IU/mL  C-reactive protein  Result Value Ref Range   CRP 8.9 0.5 - 20.0 mg/dL  CK  Result Value Ref Range   Total CK 39 7 - 177 U/L  Basic metabolic panel  Result Value Ref Range   Sodium 134 (L) 135 - 145 mEq/L   Potassium 4.5 Hemolysis seen 3.5 - 5.1 mEq/L   Chloride 98 96 - 112 mEq/L   CO2 26 19 - 32 mEq/L   Glucose, Bld 81 70 - 99 mg/dL   BUN 9 6 - 23 mg/dL   Creatinine, Ser 4.090.68 0.40 - 1.20 mg/dL   Calcium 8.6 8.4 - 81.110.5 mg/dL   GFR 91.4797.15 >82.95>60.00 mL/min

## 2018-06-10 ENCOUNTER — Ambulatory Visit: Payer: BLUE CROSS/BLUE SHIELD | Admitting: Family Medicine

## 2018-06-10 ENCOUNTER — Other Ambulatory Visit: Payer: Self-pay

## 2018-06-10 ENCOUNTER — Encounter: Payer: Self-pay | Admitting: Family Medicine

## 2018-06-10 VITALS — BP 122/86 | HR 78 | Temp 97.4°F | Resp 18 | Ht 66.0 in | Wt 269.0 lb

## 2018-06-10 DIAGNOSIS — M5090 Cervical disc disorder, unspecified, unspecified cervical region: Secondary | ICD-10-CM

## 2018-06-10 DIAGNOSIS — M62838 Other muscle spasm: Secondary | ICD-10-CM

## 2018-06-10 DIAGNOSIS — G5691 Unspecified mononeuropathy of right upper limb: Secondary | ICD-10-CM | POA: Diagnosis not present

## 2018-06-10 LAB — BASIC METABOLIC PANEL
BUN: 9 mg/dL (ref 6–23)
CO2: 26 mEq/L (ref 19–32)
Calcium: 8.6 mg/dL (ref 8.4–10.5)
Chloride: 98 mEq/L (ref 96–112)
Creatinine, Ser: 0.68 mg/dL (ref 0.40–1.20)
GFR: 97.15 mL/min (ref >60.00)
Glucose, Bld: 81 mg/dL (ref 70–99)
Potassium: 4.5 mEq/L (ref 3.5–5.1)
Sodium: 134 mEq/L — ABNORMAL LOW (ref 135–145)

## 2018-06-10 LAB — CK: Total CK: 39 U/L (ref 7–177)

## 2018-06-10 LAB — SEDIMENTATION RATE: Sed Rate: 49 mm/hr — ABNORMAL HIGH (ref 0–20)

## 2018-06-10 LAB — C-REACTIVE PROTEIN: CRP: 8.9 mg/dL (ref 0.5–20.0)

## 2018-06-10 MED ORDER — METHOCARBAMOL 750 MG PO TABS: 750.0000 mg | ORAL_TABLET | Freq: Three times a day (TID) | ORAL | 3 refills | Status: DC | PRN

## 2018-06-10 NOTE — Patient Instructions (Signed)
I will be in touch with your labs asap- if any suggestive results we will have you see rheumatology  If all is normal let's plan to have you see neurology  I increase your robaxin rx so you can take more often if needed

## 2018-06-11 ENCOUNTER — Encounter: Payer: Self-pay | Admitting: Family Medicine

## 2018-06-11 LAB — RHEUMATOID FACTOR: Rheumatoid fact SerPl-aCnc: 79 IU/mL — ABNORMAL HIGH (ref <14)

## 2018-06-11 LAB — ANA: Anti Nuclear Antibody(ANA): NEGATIVE

## 2018-06-11 NOTE — Addendum Note (Signed)
Addended by: Abbe Amsterdam C on: 06/11/2018 12:53 PM   Modules accepted: Orders

## 2018-06-12 ENCOUNTER — Telehealth: Payer: Self-pay

## 2018-06-12 NOTE — Telephone Encounter (Signed)
Copied from CRM [id number]. Topic: General - Other >> Jun 12, 2018 10:45 AM Terisa Starr wrote: Reason for CRM: Victorino Dike with [facility] called and said she is scheduled to be seen tomorrow at 8:30. Could the ANA lab results that were resulted be sent to her today @ [phone] attention : Victorino Dike

## 2018-06-12 NOTE — Telephone Encounter (Signed)
Results have been faxed

## 2018-06-13 DIAGNOSIS — M7989 Other specified soft tissue disorders: Secondary | ICD-10-CM | POA: Diagnosis not present

## 2018-06-13 DIAGNOSIS — M2550 Pain in unspecified joint: Secondary | ICD-10-CM | POA: Diagnosis not present

## 2018-06-13 DIAGNOSIS — R5382 Chronic fatigue, unspecified: Secondary | ICD-10-CM | POA: Diagnosis not present

## 2018-06-25 DIAGNOSIS — R5382 Chronic fatigue, unspecified: Secondary | ICD-10-CM | POA: Diagnosis not present

## 2018-06-25 DIAGNOSIS — M2550 Pain in unspecified joint: Secondary | ICD-10-CM | POA: Diagnosis not present

## 2018-06-25 DIAGNOSIS — M7989 Other specified soft tissue disorders: Secondary | ICD-10-CM | POA: Diagnosis not present

## 2018-06-25 DIAGNOSIS — M0579 Rheumatoid arthritis with rheumatoid factor of multiple sites without organ or systems involvement: Secondary | ICD-10-CM | POA: Diagnosis not present

## 2018-08-13 ENCOUNTER — Other Ambulatory Visit: Payer: Self-pay | Admitting: Family Medicine

## 2018-08-13 DIAGNOSIS — J4540 Moderate persistent asthma, uncomplicated: Secondary | ICD-10-CM

## 2018-08-27 ENCOUNTER — Telehealth: Payer: BLUE CROSS/BLUE SHIELD | Admitting: Physician Assistant

## 2018-08-27 DIAGNOSIS — N760 Acute vaginitis: Secondary | ICD-10-CM

## 2018-08-27 MED ORDER — FLUCONAZOLE 150 MG PO TABS: 150.0000 mg | ORAL_TABLET | Freq: Once | ORAL | 0 refills | Status: AC

## 2018-08-27 NOTE — Progress Notes (Signed)
We are sorry that you are not feeling well. Here is how we plan to help! Based on what you shared with me it looks like you: May have a yeast vaginosis   Vaginosis is an inflammation of the vagina that can result in discharge, itching and pain. The cause is usually a change in the normal balance of vaginal bacteria or an infection. Vaginosis can also result from reduced estrogen levels after menopause.  The most common causes of vaginosis are:   Bacterial vaginosis which results from an overgrowth of one on several organisms that are normally present in your vagina.   Yeast infections which are caused by a naturally occurring fungus called candida.   Vaginal atrophy (atrophic vaginosis) which results from the thinning of the vagina from reduced estrogen levels after menopause.   Trichomoniasis which is caused by a parasite and is commonly transmitted by sexual intercourse.  Factors that increase your risk of developing vaginosis include: Medications, such as antibiotics and steroids Uncontrolled diabetes Use of hygiene products such as bubble bath, vaginal spray or vaginal deodorant Douching Wearing damp or tight-fitting clothing Using an intrauterine device (IUD) for birth control Hormonal changes, such as those associated with pregnancy, birth control pills or menopause Sexual activity Having a sexually transmitted infection  Your treatment plan is A single Diflucan (fluconazole) 159m tablet once.  I have electronically sent this prescription into the pharmacy that you have chosen.  Be sure to take all of the medication as directed. Stop taking any medication if you develop a rash, tongue swelling or shortness of breath. Mothers who are breast feeding should consider pumping and discarding their breast milk while on these antibiotics. However, there is no consensus that infant exposure at these doses would be harmful.  Remember that medication creams can weaken latex  condoms. .Marland Kitchen  HOME CARE:  Good hygiene may prevent some types of vaginosis from recurring and may relieve some symptoms:  Avoid baths, hot tubs and whirlpool spas. Rinse soap from your outer genital area after a shower, and dry the area well to prevent irritation. Don't use scented or harsh soaps, such as those with deodorant or antibacterial action. Avoid irritants. These include scented tampons and pads. Wipe from front to back after using the toilet. Doing so avoids spreading fecal bacteria to your vagina.  Other things that may help prevent vaginosis include:  Don't douche. Your vagina doesn't require cleansing other than normal bathing. Repetitive douching disrupts the normal organisms that reside in the vagina and can actually increase your risk of vaginal infection. Douching won't clear up a vaginal infection. Use a latex condom. Both female and female latex condoms may help you avoid infections spread by sexual contact. Wear cotton underwear. Also wear pantyhose with a cotton crotch. If you feel comfortable without it, skip wearing underwear to bed. Yeast thrives in mCampbell SoupYour symptoms should improve in the next day or two.  GET HELP RIGHT AWAY IF:  You have pain in your lower abdomen ( pelvic area or over your ovaries) You develop nausea or vomiting You develop a fever Your discharge changes or worsens You have persistent pain with intercourse You develop shortness of breath, a rapid pulse, or you faint.  These symptoms could be signs of problems or infections that need to be evaluated by a medical provider now.  MAKE SURE YOU   Understand these instructions. Will watch your condition. Will get help right away if you are not doing well or get  worse.  Your e-visit answers were reviewed by a board certified advanced clinical practitioner to complete your personal care plan. Depending upon the condition, your plan could have included both over the counter or  prescription medications. Please review your pharmacy choice to make sure that you have choses a pharmacy that is open for you to pick up any needed prescription, Your safety is important to [place]. If you have drug allergies check your prescription carefully.   You can use MyChart to ask questions about today's visit, request a non-urgent call back, or ask for a work or school excuse for 24 hours related to this e-Visit. If it has been greater than 24 hours you will need to follow up with your provider, or enter a new e-Visit to address those concerns. You will get a MyChart message within the next two days asking about your experience. I hope that your e-visit has been valuable and will speed your recovery.  ===View-only below this line===   ----- Message -----    From: Janne Lab    Sent: 08/27/2018 10:01 AM EDT      To: E-Visit Mailing List Subject: E-Visit Submission: Vaginal Symptoms  E-Visit Submission: Vaginal Symptoms --------------------------------  Question: Which of the following are you experiencing? Answer:   Vaginal itching  Question: Are you having pain while passing urine? Answer:   No, I have no pain while urinating  Question: Which of the following applies to your vaginal discharge Answer:   I have a clear discharge  Question: Are you pregnant? Answer:   I am confident that I am not pregnant  Question: Are you breastfeeding? Answer:   No  Question: Which of the following are you experiencing? Answer:   Frequent urination  Question: Do you have any sores on your genitals? Answer:   No  Question: Have you taken antibiotics recently? Answer:   I have not been on any antibiotics  Question: Do you do any of the following? Answer:   None of the above  Question: Which of the following applies to your menstrual period? Answer:   I had a menstrual period in the last 2 weeks  Question: Have you had similar symptoms in the past? Answer:   Yes, I have had  similar symptoms before  Question: When you had similar symptoms in  the past, did any of the following work? Answer:   Pills for yeast infection            Pills for urine infection  Question: Do you have a fever? Answer:   No, I do not have a fever  Question: During the past 2 months, have you had sexual contact with a specific person for the first time? Answer:   No  Question: Has a person with whom you have had sexual contact been recently told they have a disease possibly acquired through sex? Answer:   No  Question: Please list your medication allergies that you may have ? (If 'none' , please list as 'none') Answer:   Penicillin makes me nauseous and sulfa abx give me hives  Question: Please list any additional comments  Answer:     A total of 5-10 minutes was spent evaluating this patients questionnaire and formulating a plan of care.

## 2018-09-30 DIAGNOSIS — M0579 Rheumatoid arthritis with rheumatoid factor of multiple sites without organ or systems involvement: Secondary | ICD-10-CM | POA: Diagnosis not present

## 2018-09-30 DIAGNOSIS — M7989 Other specified soft tissue disorders: Secondary | ICD-10-CM | POA: Diagnosis not present

## 2018-09-30 DIAGNOSIS — R5382 Chronic fatigue, unspecified: Secondary | ICD-10-CM | POA: Diagnosis not present

## 2018-09-30 DIAGNOSIS — M2550 Pain in unspecified joint: Secondary | ICD-10-CM | POA: Diagnosis not present

## 2018-11-19 DIAGNOSIS — M7989 Other specified soft tissue disorders: Secondary | ICD-10-CM | POA: Diagnosis not present

## 2018-11-19 DIAGNOSIS — M2550 Pain in unspecified joint: Secondary | ICD-10-CM | POA: Diagnosis not present

## 2018-11-19 DIAGNOSIS — R5382 Chronic fatigue, unspecified: Secondary | ICD-10-CM | POA: Diagnosis not present

## 2018-11-19 DIAGNOSIS — M0579 Rheumatoid arthritis with rheumatoid factor of multiple sites without organ or systems involvement: Secondary | ICD-10-CM | POA: Diagnosis not present

## 2018-11-25 ENCOUNTER — Other Ambulatory Visit: Payer: Self-pay | Admitting: Family Medicine

## 2018-11-25 ENCOUNTER — Other Ambulatory Visit: Payer: Self-pay

## 2018-11-25 ENCOUNTER — Ambulatory Visit
Admission: RE | Admit: 2018-11-25 | Discharge: 2018-11-25 | Disposition: A | Discharge status: Home / Self Care | Payer: BC Managed Care – PPO | Source: Ambulatory Visit | Attending: Family Medicine | Admitting: Family Medicine

## 2018-11-25 DIAGNOSIS — N63 Unspecified lump in unspecified breast: Secondary | ICD-10-CM

## 2018-12-05 ENCOUNTER — Other Ambulatory Visit: Payer: Self-pay | Admitting: Family Medicine

## 2018-12-05 DIAGNOSIS — R519 Headache, unspecified: Secondary | ICD-10-CM

## 2018-12-19 DIAGNOSIS — M2550 Pain in unspecified joint: Secondary | ICD-10-CM | POA: Diagnosis not present

## 2018-12-19 DIAGNOSIS — R5382 Chronic fatigue, unspecified: Secondary | ICD-10-CM | POA: Diagnosis not present

## 2018-12-19 DIAGNOSIS — M7989 Other specified soft tissue disorders: Secondary | ICD-10-CM | POA: Diagnosis not present

## 2018-12-19 DIAGNOSIS — M0579 Rheumatoid arthritis with rheumatoid factor of multiple sites without organ or systems involvement: Secondary | ICD-10-CM | POA: Diagnosis not present

## 2019-01-08 DIAGNOSIS — Z20828 Contact with and (suspected) exposure to other viral communicable diseases: Secondary | ICD-10-CM | POA: Diagnosis not present

## 2019-01-08 DIAGNOSIS — Z6841 Body Mass Index (BMI) 40.0 and over, adult: Secondary | ICD-10-CM | POA: Diagnosis not present

## 2019-01-20 ENCOUNTER — Other Ambulatory Visit: Payer: Self-pay

## 2019-01-20 ENCOUNTER — Encounter: Payer: Self-pay | Admitting: Family Medicine

## 2019-01-20 ENCOUNTER — Ambulatory Visit (INDEPENDENT_AMBULATORY_CARE_PROVIDER_SITE_OTHER): Payer: BC Managed Care – PPO | Admitting: Family Medicine

## 2019-01-20 VITALS — BP 116/82 | HR 85 | Temp 97.4°F | Resp 16 | Ht 66.0 in | Wt 260.0 lb

## 2019-01-20 DIAGNOSIS — N62 Hypertrophy of breast: Secondary | ICD-10-CM

## 2019-01-20 DIAGNOSIS — I776 Arteritis, unspecified: Secondary | ICD-10-CM | POA: Diagnosis not present

## 2019-01-20 NOTE — Progress Notes (Addendum)
[facility] [address], [address] [place], [place] [address] [phone]  Date:  01/20/2019   Name:  Diane Wells   DOB:  1980/06/09   MRN:  [id number]  PCP:  Pearline Cables, MD    Chief Complaint: Leg Swelling (worse after walking, painful, bilateral leg swelling, stopped prednisone, capillaries busting under skin from the swelling)   History of Present Illness:  Diane Wells is a 38 y.o. very pleasant female patient who presents with the following:  Pt with history of asthma and allergies Last seen by myself in March of this year  Here today with concern of swelling in her bilateral legs  She is seeing Dr young at [facility] rheum- she was dx with RA earlier this year She was treated with prednisone briefly She was on methotrexate but did not tolerate it well - vomiting, unintended weight loss She started on humira 2 weeks ago -so far she has tolerated this okay  In May she first developed intermittent lower extremity joint pain, swelling of her legs and punctate bruising -  They thought it might be related to prednisone - they were able to take her off pred in July however and the problem has continued This has occurred periodically for the last several months, she estimates maybe 8 times total She would typically notice some pain in her feet and ankles, then swelling, then petechial bruising.  She notes easy bruising in general but nothing unusual.  No other rash or petechiae noted No bleeding such as nosebleeds  She had mentioned this issue to her rheumatologist, who felt it was not an autoimmune issue.  However at that time her rash was not to be evident per patient report  She otherwise feels okay, no fever or chills  Patient Active Problem List   Diagnosis Date Noted  . Patellar dislocation 01/17/2018  . Acute left ankle pain 11/27/2017  . Allergic reaction 02/15/2016  . Moderate persistent  asthma 02/15/2016  . Perennial and seasonal allergic rhinitis 02/15/2016  . Allergic conjunctivitis 02/15/2016  . Obesity, unspecified obesity severity, unspecified obesity type 01/10/2016    Past Medical History:  Diagnosis Date  . Asthma   . Recurrent upper respiratory infection (URI)     Past Surgical History:  Procedure Laterality Date  . ADENOIDECTOMY    . ANTERIOR CERVICAL DECOMP/DISCECTOMY FUSION      Social History   Tobacco Use  . Smoking status: Never Smoker  . Smokeless tobacco: Never Used  Substance Use Topics  . Alcohol use: No  . Drug use: No    Family History  Problem Relation Age of Onset  . Diabetes Mother   . Hypertension Mother   . Pulmonary Hypertension Mother   . Hyperthyroidism Mother   . Glaucoma Mother   . Neuropathy Mother   . Sjogren's syndrome Mother   . Carpal tunnel syndrome Mother   . Hypertension Father   . Gallbladder disease Father   . Hypertension Maternal Grandmother   . Glaucoma Maternal Grandmother   . Coronary artery disease Maternal Grandmother   . Heart attack Maternal Grandmother   . Angina Maternal Grandmother   . Macular degeneration Maternal Grandmother   . COPD Maternal Grandmother   . Congestive Heart Failure Maternal Grandmother   . Leukemia Paternal Grandmother   . Hypertension Paternal Grandmother   . Colon cancer Paternal Grandfather   . Diabetes Maternal Uncle   . Heart attack  Maternal Uncle   . Stroke Maternal Uncle   . Glaucoma Maternal Grandfather   . Allergic rhinitis Neg Hx   . Angioedema Neg Hx   . Asthma Neg Hx   . Eczema Neg Hx     Allergies  Allergen Reactions  . Penicillins Hives  . Sulfa Antibiotics     Medication list has been reviewed and updated.  Current Outpatient Medications on File Prior to Visit  Medication Sig Dispense Refill  . butalbital-acetaminophen-caffeine (FIORICET) 50-325-40 MG tablet TAKE 1-2 TABLETS BY MOUTH EVERY 6 (SIX) HOURS AS NEEDED FOR HEADACHE. MAX 6 PER DAY  30 tablet 0  . diphenhydrAMINE (BENADRYL) 25 mg capsule Take 25 mg by mouth every 6 (six) hours as needed.    Marland Kitchen EPINEPHrine (EPIPEN 2-PAK) 0.3 mg/0.3 mL IJ SOAJ injection Inject into the muscle once.    Marland Kitchen FLOVENT HFA 110 MCG/ACT inhaler Please specify directions, refills and quantity 1 Inhaler 5  . gabapentin (NEURONTIN) 300 MG capsule Take 1 capsule (300 mg total) by mouth 3 (three) times daily. 90 capsule 5  . HUMIRA PEN 40 MG/0.4ML PNKT     . Ibuprofen-Famotidine 800-26.6 MG TABS Take 1 tablet by mouth 3 (three) times daily. 90 tablet 3  . methocarbamol (ROBAXIN) 750 MG tablet Take 1 tablet (750 mg total) by mouth every 8 (eight) hours as needed for muscle spasms. 90 tablet 3  . PROAIR HFA 108 (90 Base) MCG/ACT inhaler Inhale 1 puff into the lungs daily as needed. 1 Inhaler 12   No current facility-administered medications on file prior to visit.     Review of Systems:  As per HPI- otherwise negative.   Physical Examination: Vitals:   01/20/19 1524  BP: 116/82  Pulse: 85  Resp: 16  Temp: (!) 97.4 F (36.3 C)  SpO2: 100%   Vitals:   01/20/19 1524  Weight: 260 lb (117.9 kg)  Height: 5\' 6"  (1.676 m)   Body mass index is 41.97 kg/m. Ideal Body Weight: Weight in (lb) to have BMI = 25: 154.6  GEN: WDWN, NAD, Non-toxic, A & O x 3, obese, looks well HEENT: Atraumatic, Normocephalic. Neck supple. No masses, No LAD. Ears and Nose: No external deformity. CV: RRR, No M/G/R. No JVD. No thrill. No extra heart sounds. PULM: CTA B, no wheezes, crackles, rhonchi. No retractions. No resp. distress. No accessory muscle use. ABD: S, NT, ND EXTR: No c/c/e NEURO Normal gait.  PSYCH: Normally interactive. Conversant. Not depressed or anxious appearing.  Calm demeanor.  She has discrete scattered petechiae over her bilateral lower extremities, from the mid shin down to the feet.  The largest about 7 mm in diameter, most smaller Minimal swelling of the left ankle is present.  She notes mild  tenderness with movement of the ankle joints The feet are warm and well-perfused Per patient her symptoms are already improving, her swelling and rash is improving since yesterday  Assessment and Plan: Vasculitis (HCC) - Plan: Ambulatory referral to Vascular Surgery, Ambulatory referral to Dermatology  Macromastia - Plan: Ambulatory referral to Plastic Surgery  Here today with recurrent petechial rash accompanied by bilateral ankle swelling and pain.  This is occurred about 8 times over the last several months.  This does seem a vasculitis and I suspect is related to her autoimmune disorder.  I have a call into her rheumatologist, have asked her to call me back at her convenience If any change or worsening of her symptoms she will notify me  We will  refer her to vascular surgery for an opinion, and also dermatology though if her rash is not active this may not be useful  Diane Wells would also like to pursue a breast reduction.  Referral to plastic surgery to discuss  Signed Lamar Blinks, MD  Her rheumatology provider Elon Alas called me back on 10/28, we discussed her recent skin findings and I sent her a photo that I took.  She plans to pursue this with the patient at the visit next month, will update [place]

## 2019-01-20 NOTE — Patient Instructions (Addendum)
Good to see you today!   I tend to think that your symptoms are due to some sort of inflammatory process. However, we can have you see vascular surgery as well to get their input.  If you have any other unusual symptoms please let me know.  I will touch base with Sharyn Lull about your symptoms as well

## 2019-01-22 ENCOUNTER — Encounter: Payer: Self-pay | Admitting: Family Medicine

## 2019-01-22 ENCOUNTER — Ambulatory Visit: Payer: BC Managed Care – PPO

## 2019-01-28 ENCOUNTER — Ambulatory Visit (INDEPENDENT_AMBULATORY_CARE_PROVIDER_SITE_OTHER): Payer: BC Managed Care – PPO

## 2019-01-28 ENCOUNTER — Other Ambulatory Visit: Payer: Self-pay

## 2019-01-28 DIAGNOSIS — Z23 Encounter for immunization: Secondary | ICD-10-CM

## 2019-02-09 ENCOUNTER — Telehealth: Payer: BC Managed Care – PPO | Admitting: Physician Assistant

## 2019-02-09 DIAGNOSIS — J019 Acute sinusitis, unspecified: Secondary | ICD-10-CM | POA: Diagnosis not present

## 2019-02-09 DIAGNOSIS — B9689 Other specified bacterial agents as the cause of diseases classified elsewhere: Secondary | ICD-10-CM | POA: Diagnosis not present

## 2019-02-09 MED ORDER — DOXYCYCLINE HYCLATE 100 MG PO CAPS: 100.0000 mg | ORAL_CAPSULE | Freq: Two times a day (BID) | ORAL | 0 refills | Status: DC

## 2019-02-09 NOTE — Progress Notes (Signed)

## 2019-02-09 NOTE — Progress Notes (Signed)
I have spent 5 minutes in review of e-visit questionnaire, review and updating patient chart, medical decision making and response to patient.   Chevez Sambrano Cody Gearld Kerstein, PA-C    

## 2019-02-11 DIAGNOSIS — M2550 Pain in unspecified joint: Secondary | ICD-10-CM | POA: Diagnosis not present

## 2019-02-11 DIAGNOSIS — M0579 Rheumatoid arthritis with rheumatoid factor of multiple sites without organ or systems involvement: Secondary | ICD-10-CM | POA: Diagnosis not present

## 2019-02-11 DIAGNOSIS — M310 Hypersensitivity angiitis: Secondary | ICD-10-CM | POA: Diagnosis not present

## 2019-02-11 DIAGNOSIS — M7989 Other specified soft tissue disorders: Secondary | ICD-10-CM | POA: Diagnosis not present

## 2019-02-11 DIAGNOSIS — R5382 Chronic fatigue, unspecified: Secondary | ICD-10-CM | POA: Diagnosis not present

## 2019-02-18 ENCOUNTER — Other Ambulatory Visit: Payer: Self-pay | Admitting: Family Medicine

## 2019-02-18 DIAGNOSIS — R519 Headache, unspecified: Secondary | ICD-10-CM

## 2019-02-19 DIAGNOSIS — R768 Other specified abnormal immunological findings in serum: Secondary | ICD-10-CM | POA: Diagnosis not present

## 2019-02-19 NOTE — Telephone Encounter (Signed)
Requesting:bultalbital Contract:none UDS:02/24/2016 needs new uds Last Visit:01/20/2019 Next Visit:none Last Refill:12/05/2018  Please Advise

## 2019-03-05 DIAGNOSIS — R768 Other specified abnormal immunological findings in serum: Secondary | ICD-10-CM | POA: Diagnosis not present

## 2019-03-19 ENCOUNTER — Ambulatory Visit (INDEPENDENT_AMBULATORY_CARE_PROVIDER_SITE_OTHER): Payer: BC Managed Care – PPO | Admitting: Internal Medicine

## 2019-03-19 ENCOUNTER — Other Ambulatory Visit: Payer: Self-pay

## 2019-03-19 ENCOUNTER — Telehealth: Payer: BC Managed Care – PPO

## 2019-03-19 DIAGNOSIS — H15101 Unspecified episcleritis, right eye: Secondary | ICD-10-CM | POA: Diagnosis not present

## 2019-03-19 DIAGNOSIS — H15001 Unspecified scleritis, right eye: Secondary | ICD-10-CM | POA: Diagnosis not present

## 2019-03-19 DIAGNOSIS — H53042 Amblyopia suspect, left eye: Secondary | ICD-10-CM | POA: Diagnosis not present

## 2019-03-19 NOTE — Progress Notes (Signed)
Subjective:    Patient ID: Diane Wells, female    DOB: [birth year], 38 y.o.   MRN: [id number]  DOS:  03/19/2019 Type of visit - description:Virtual Visit via Video Note  I connected with the above patient  by a video enabled telemedicine application and verified that I am speaking with the correct person using two identifiers.   THIS ENCOUNTER IS A VIRTUAL VISIT DUE TO COVID-19 - PATIENT WAS NOT SEEN IN THE OFFICE. PATIENT HAS CONSENTED TO VIRTUAL VISIT / TELEMEDICINE VISIT   Location of patient: home  Location of provider: office  I discussed the limitations of evaluation and management by telemedicine and the availability of in person appointments. The patient expressed understanding and agreed to proceed.  History of Present Illness:  Acute visit Symptoms started yesterday with redness at the temporal side of the right conjunctiva associated with pain mostly at the same area.  Feels like somebody "punched her" in the eye.  All this happening in the context of rheumatoid arthritis and + vasculitis panel. Holding Humira for 6 weeks while she is treated with sinusitis. She just finished  ABXs as prescribed by ENT.   Review of Systems Denies any eye discharge Vision is normal, not blurred, no diplopia. Mild light intolerance. No fever chills Continue with some sinus symptoms: Blowing, bloody discharge.   Past Medical History:  Diagnosis Date  . Asthma   . Recurrent upper respiratory infection (URI)     Past Surgical History:  Procedure Laterality Date  . ADENOIDECTOMY    . ANTERIOR CERVICAL DECOMP/DISCECTOMY FUSION      Social History   Socioeconomic History  . Marital status: Single    Spouse name: Not on file  . Number of children: Not on file  . Years of education: Not on file  . Highest education level: Not on file  Occupational History  . Not on file  Tobacco Use  . Smoking status: Never Smoker  . Smokeless tobacco: Never Used  Substance  and Sexual Activity  . Alcohol use: No  . Drug use: No  . Sexual activity: Not on file  Other Topics Concern  . Not on file  Social History Narrative  . Not on file   Social Determinants of Health   Financial Resource Strain:   . Difficulty of Paying Living Expenses: Not on file  Food Insecurity:   . Worried About Charity fundraiser in the Last Year: Not on file  . Ran Out of Food in the Last Year: Not on file  Transportation Needs:   . Lack of Transportation (Medical): Not on file  . Lack of Transportation (Non-Medical): Not on file  Physical Activity:   . Days of Exercise per Week: Not on file  . Minutes of Exercise per Session: Not on file  Stress:   . Feeling of Stress : Not on file  Social Connections:   . Frequency of Communication with Friends and Family: Not on file  . Frequency of Social Gatherings with Friends and Family: Not on file  . Attends Religious Services: Not on file  . Active Member of Clubs or Organizations: Not on file  . Attends Archivist Meetings: Not on file  . Marital Status: Not on file  Intimate Partner Violence:   . Fear of Current or Ex-Partner: Not on file  . Emotionally Abused: Not on file  . Physically Abused: Not on file  . Sexually Abused: Not on file  Allergies as of 03/19/2019      Reactions   Penicillins Hives   Sulfa Antibiotics       Medication List       Accurate as of March 19, 2019  2:56 PM. If you have any questions, ask your nurse or doctor.        butalbital-acetaminophen-caffeine 50-325-40 MG tablet Commonly known as: FIORICET TAKE 1-2 TABLETS BY MOUTH EVERY 6 (SIX) HOURS AS NEEDED FOR HEADACHE. MAX 6 PER DAY   diphenhydrAMINE 25 mg capsule Commonly known as: BENADRYL Take 25 mg by mouth every 6 (six) hours as needed.   doxycycline 100 MG capsule Commonly known as: VIBRAMYCIN Take 1 capsule (100 mg total) by mouth 2 (two) times daily.   EpiPen 2-Pak 0.3 mg/0.3 mL Soaj injection Generic  drug: EPINEPHrine Inject into the muscle once.   Flovent HFA 110 MCG/ACT inhaler Generic drug: fluticasone Please specify directions, refills and quantity   gabapentin 300 MG capsule Commonly known as: NEURONTIN Take 1 capsule (300 mg total) by mouth 3 (three) times daily.   Humira Pen 40 MG/0.4ML Pnkt Generic drug: Adalimumab   Ibuprofen-Famotidine 800-26.6 MG Tabs Take 1 tablet by mouth 3 (three) times daily.   methocarbamol 750 MG tablet Commonly known as: ROBAXIN Take 1 tablet (750 mg total) by mouth every 8 (eight) hours as needed for muscle spasms.   ProAir HFA 108 (90 Base) MCG/ACT inhaler Generic drug: albuterol Inhale 1 puff into the lungs daily as needed.           Objective:   Physical Exam There were no vitals taken for this visit. This is a virtual video assessment, she is alert oriented x3, EOMI, conjunctiva on the temporal side is injected.  I do not see any discharge.  Pupils normal size.  Face is otherwise symmetric.  Nose does not sound congested.    Assessment     38 year old female, history of rheumatoid arthritis, currently holding Humira. Also history of + vasculitis panel undergoing work-up.  Presents with:  Right eye pain injected conjunctiva: Suspect  episcleritis or other inflammatory eye condition in the context of RA and  a  + vasculitis panel, off Humira She needs to see ophthalmology, her optometrist is close and she already made an appointment to see ophthalmology but is not going to be but in 2 to 3 weeks. 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  I appreciate ophthalmology help.)     I discussed the assessment and treatment plan with the patient. The patient was provided an opportunity to ask questions and all were answered. The patient agreed with the plan and demonstrated an understanding of  the instructions.   The patient was advised to call back or seek an in-person evaluation if the symptoms worsen or if the condition fails to improve as anticipated.

## 2019-03-31 DIAGNOSIS — H53042 Amblyopia suspect, left eye: Secondary | ICD-10-CM | POA: Diagnosis not present

## 2019-03-31 DIAGNOSIS — H15003 Unspecified scleritis, bilateral: Secondary | ICD-10-CM | POA: Diagnosis not present

## 2019-04-01 DIAGNOSIS — J310 Chronic rhinitis: Secondary | ICD-10-CM | POA: Diagnosis not present

## 2019-04-01 DIAGNOSIS — J343 Hypertrophy of nasal turbinates: Secondary | ICD-10-CM | POA: Diagnosis not present

## 2019-04-01 DIAGNOSIS — J324 Chronic pansinusitis: Secondary | ICD-10-CM | POA: Diagnosis not present

## 2019-04-09 DIAGNOSIS — R319 Hematuria, unspecified: Secondary | ICD-10-CM | POA: Diagnosis not present

## 2019-04-09 DIAGNOSIS — N179 Acute kidney failure, unspecified: Secondary | ICD-10-CM | POA: Diagnosis not present

## 2019-04-09 DIAGNOSIS — R809 Proteinuria, unspecified: Secondary | ICD-10-CM | POA: Diagnosis not present

## 2019-04-09 DIAGNOSIS — R32 Unspecified urinary incontinence: Secondary | ICD-10-CM | POA: Diagnosis not present

## 2019-04-11 ENCOUNTER — Other Ambulatory Visit: Payer: Self-pay | Admitting: Otolaryngology

## 2019-04-11 DIAGNOSIS — M5440 Lumbago with sciatica, unspecified side: Secondary | ICD-10-CM | POA: Diagnosis not present

## 2019-04-11 DIAGNOSIS — J329 Chronic sinusitis, unspecified: Secondary | ICD-10-CM

## 2019-04-11 DIAGNOSIS — M3131 Wegener's granulomatosis with renal involvement: Secondary | ICD-10-CM | POA: Diagnosis not present

## 2019-04-11 DIAGNOSIS — G629 Polyneuropathy, unspecified: Secondary | ICD-10-CM | POA: Diagnosis not present

## 2019-04-12 ENCOUNTER — Encounter: Payer: Self-pay | Admitting: Family Medicine

## 2019-04-14 NOTE — Progress Notes (Signed)
[place] at [facility] [address] [place], [place] [address] [phone]  Date:  04/16/2019   Name:  Diane Wells   DOB:  [birth year]   MRN:  [id number]  PCP:  Darreld Mclean, MD    Chief Complaint: No chief complaint on file.   History of Present Illness:  Diane Wells is a 39 y.o. 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 My symptoms are increasing rapidly week to week day to day right now. So I don't want to wait any longer to get you involved! I'm not sleeping at all, I'm stressed and I'm   I'm following up with you to update you on my current status and to see what I need to do so you can be involved in my case management, I don't have any trust right now that any of these doctors are sending records over etc when I list you as my primary doctor. I need help this is very overwhelming and while it's still in the beginning stage I want to be as organized and clear as possible with my new medical team. You saw me last with the vasculitis in my legs. Well [facility] Rheumatology did an ANCA titer after you talked to Leafy Kindle (who I've been seeing for the past year, thought she was a doctor all this time but she's a PA, I've not met with a Rheumatologist there apparently ever - Im upset sorry, I'll digress). I'm attaching the Titer results - at first [place] wasn't concerned and said I didn't have symptoms- I pushed for more  diagnostics because I felt that ALL my symptoms matched. Including earlier on symptoms for example I've been having constant acute sinusitis & sinus infections now for the past 13 months on top of everything else.   Fast forward to today -  1. I've now met with Dr. Kasandra Knudsen Raynelle Bring and based on exam findings (both of my sinuses are 100% impacted and blood and crusting) his money is on GPA even without biopsy. I have a CT scheduled at [facility] this Tuesday and a follow up appointment next with him on 04/22/19 to go over those results and schedule surgery. I'm on a 63m taper pack of Prednisone but last dose is today.   2. I [facility]/ she confirmed definite Kidney involvement. I have lots of protein in my urine & blood and the blood cells look textbook classical for GPA. She ran a ton of labs and I don't have those results yet. She also is sending me for an ultrasound of my kidneys and bladder. We may move forward with a kidney biopsy pending labs.  3. 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 My next follow  up with Dr. Eulas Post at [facility] is on 1/25. Side note: Please thank your colleague- I saw Dr. Kathlene November who got me into the Opthamologist that day and then personally called me Christmas Eve to check up on me. This is why I love your group and trust you all completely.  4. Yesterday I met with my neurosurgeon Dr. Kary Kos - originally when I scheduled my appointment 6 weeks ago I was having acute lumbar pain, balance issues and more and more neuropathy in my legs. It's gotten worse in the past few weeks. He's ordered a MRI of the Lumbar spine and a full EMG testing of my legs feet and hands and is from my request referring me to see a Neurologist- trying to see  Dr. Margette Fast I think but Dr Saintclair Halsted was going to see who could see me sooner. I'm extremely worried that I have vasculitis involvement with my CNS based on what I've read and how I feel.  Now Awaiting all of the next steps I don't know what else to do. I want to see a pulmonologist ASAP to get a work up and baseline at minimum. I'm coughing up thick & often bloody phlegm from the sinus drainage but I think it's all back of the throat not the lungs, my O2 rate is consistent 98. Can you refer me? Should I also have a cardiac workup? My blood pressure is high at times but then normal. I think it only is elevated when I'm stressed, in a ton of pain, not sleeping well at all. For the most part my resting BP has been normal high - the prednisone making it higher. Yesterday though with the pain it did go up to 209/119 but then came down quickly to 130/89. The Nephrologist wasn't concerned about Hypertension yet - she told me the spikes were expected while acute pain and on Pred. I agree. I can tell the resting BP is elevated from my norm but I also have only slept 6 hours total in the past 3 nights combined. She did start me on a low dose 12.92m of Losartan Potassium daily to help protect my kidneys.  What do I do next? Referral for Pulmonologist/Cardiologist? Is there a case manager assistant type of thing to help me? What about the COVID vaccine- I so afraid won't be able to get it once I start treatment especially if I'm on Rituximab. I'm scared. Can I also get a referral to talk with Dr. BEstrella Myrtlejust with the emotional part of this? TKarna Christmasis actually a good friend of mine. I'll probably text her later today.   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 and 20 p.m.- she has a lot  of difficulty with sleep when she is on prednisone She tried benadryl but this did not help her prednisone associated insomnia She notes that there was a 48-hour period where she only slept a couple of hours.  She is checking her BP at home on occasion and she wants got a reading of 210/119 - this is when she was not sleeping Otherwise her BP tends to run 142/94, 136/100, 129/92 Pulse running 112 or so  Nephrology started her on losartan 12.5 last Wednesday-she wonders about increasing this dose  She is the office manager at [employer]; she is not aware of an FMLA policy there She states that her office staff is aware of her current health challenges, everyone is okay with her taking some time off if need be Patient Active Problem List   Diagnosis Date Noted  . Patellar dislocation 01/17/2018  . Acute left ankle pain 11/27/2017  . Allergic reaction 02/15/2016  . Moderate persistent asthma 02/15/2016  . Perennial and seasonal allergic rhinitis 02/15/2016  . Allergic conjunctivitis 02/15/2016  . Obesity, unspecified obesity severity, unspecified obesity type 01/10/2016    Past Medical History:  Diagnosis Date  . Asthma   . Recurrent upper respiratory infection (URI)     Past Surgical History:  Procedure Laterality Date  . ADENOIDECTOMY    . ANTERIOR CERVICAL DECOMP/DISCECTOMY FUSION      Social History   Tobacco Use  . Smoking status: Never Smoker  . Smokeless tobacco: Never Used  Substance Use Topics  . Alcohol use: No   . Drug use: No    Family History  Problem Relation Age of Onset  . Diabetes Mother   . Hypertension Mother   . Pulmonary Hypertension Mother   . Hyperthyroidism Mother   . Glaucoma Mother   . Neuropathy Mother   . Sjogren's syndrome Mother   . Carpal tunnel syndrome Mother   . Hypertension Father   . Gallbladder disease Father   . Hypertension Maternal Grandmother   . Glaucoma Maternal Grandmother   . Coronary artery disease Maternal Grandmother   . Heart attack Maternal Grandmother   . Angina Maternal Grandmother   . Macular degeneration Maternal Grandmother   . COPD Maternal Grandmother   . Congestive Heart Failure Maternal Grandmother   . Leukemia Paternal Grandmother   . Hypertension Paternal Grandmother   . Colon cancer Paternal Grandfather   . Diabetes Maternal Uncle   . Heart attack Maternal Uncle   . Stroke Maternal Uncle   . Glaucoma Maternal Grandfather   . Allergic rhinitis Neg Hx   . Angioedema Neg Hx   . Asthma Neg Hx   . Eczema Neg Hx     Allergies  Allergen Reactions  . Penicillins Hives  . Sulfa Antibiotics     Medication list has been reviewed and updated.  Current Outpatient Medications on File Prior to Visit  Medication Sig Dispense Refill  . butalbital-acetaminophen-caffeine (FIORICET) 50-325-40 MG tablet TAKE 1-2 TABLETS BY MOUTH EVERY 6 (SIX) HOURS AS NEEDED FOR HEADACHE. MAX 6 PER DAY 30 tablet 1  . diphenhydrAMINE (BENADRYL) 25 mg capsule Take 25 mg by mouth every 6 (six) hours as needed.    Marland Kitchen EPINEPHrine (EPIPEN 2-PAK) 0.3 mg/0.3 mL IJ SOAJ injection Inject into the muscle once.    Marland Kitchen FLOVENT HFA 110 MCG/ACT inhaler Please specify directions, refills and quantity 1 Inhaler 5  . gabapentin (NEURONTIN) 300 MG capsule Take 1 capsule (300 mg total) by mouth 3 (three) times daily. [place]  capsule 5  . Ibuprofen-Famotidine 800-26.6 MG TABS Take 1 tablet by mouth 3 (three) times daily. 90 tablet 3  . methocarbamol (ROBAXIN) 750 MG tablet Take 1 tablet  (750 mg total) by mouth every 8 (eight) hours as needed for muscle spasms. 90 tablet 3  . PROAIR HFA 108 (90 Base) MCG/ACT inhaler Inhale 1 puff into the lungs daily as needed. 1 Inhaler 12   No current facility-administered medications on file prior to visit.    Review of Systems:  As per HPI- otherwise negative.   Physical Examination: There were no vitals filed for this visit. There were no vitals filed for this visit. There is no height or weight on file to calculate BMI. Ideal Body Weight:    Patient is observed on video monitor.  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  She is occasionally tearful  Assessment and Plan: Autoimmune disorder ([place])  Drug-induced insomnia ([place]) - Plan: LORazepam (ATIVAN) 0.5 MG tablet  Adjustment disorder with other symptom  Here today to follow-up on evolving autoimmune disorder work-up Pt is being evaluated by rheumatology, optho, nephrology among others At this time we are not sure of her exact dx She is going to start on pred 60/d today per rheumatology and we hope this will be helpful for her  I offered encouragement that her sx will get better once her dx is confirmed and she is started on disease specific rx For the time being she would like some help with sleep while on prednisone this is reasonable   rx for ativan given Cautioned about sedation and habit formation  Signed Lamar Blinks, MD

## 2019-04-15 ENCOUNTER — Ambulatory Visit
Admission: RE | Admit: 2019-04-15 | Discharge: 2019-04-15 | Disposition: A | Discharge status: Home / Self Care | Payer: BC Managed Care – PPO | Source: Ambulatory Visit | Attending: Otolaryngology | Admitting: Otolaryngology

## 2019-04-15 ENCOUNTER — Other Ambulatory Visit: Payer: Self-pay

## 2019-04-15 DIAGNOSIS — M0579 Rheumatoid arthritis with rheumatoid factor of multiple sites without organ or systems involvement: Secondary | ICD-10-CM | POA: Diagnosis not present

## 2019-04-15 DIAGNOSIS — I776 Arteritis, unspecified: Secondary | ICD-10-CM | POA: Diagnosis not present

## 2019-04-15 DIAGNOSIS — M2550 Pain in unspecified joint: Secondary | ICD-10-CM | POA: Diagnosis not present

## 2019-04-15 DIAGNOSIS — J329 Chronic sinusitis, unspecified: Secondary | ICD-10-CM

## 2019-04-15 DIAGNOSIS — M310 Hypersensitivity angiitis: Secondary | ICD-10-CM | POA: Diagnosis not present

## 2019-04-16 ENCOUNTER — Ambulatory Visit (INDEPENDENT_AMBULATORY_CARE_PROVIDER_SITE_OTHER): Payer: BC Managed Care – PPO | Admitting: Psychology

## 2019-04-16 ENCOUNTER — Other Ambulatory Visit: Payer: Self-pay | Admitting: Internal Medicine

## 2019-04-16 ENCOUNTER — Ambulatory Visit (INDEPENDENT_AMBULATORY_CARE_PROVIDER_SITE_OTHER): Payer: BC Managed Care – PPO | Admitting: Family Medicine

## 2019-04-16 ENCOUNTER — Encounter: Payer: Self-pay | Admitting: Family Medicine

## 2019-04-16 DIAGNOSIS — F19982 Other psychoactive substance use, unspecified with psychoactive substance-induced sleep disorder: Secondary | ICD-10-CM

## 2019-04-16 DIAGNOSIS — D8989 Other specified disorders involving the immune mechanism, not elsewhere classified: Secondary | ICD-10-CM

## 2019-04-16 DIAGNOSIS — F4322 Adjustment disorder with anxiety: Secondary | ICD-10-CM

## 2019-04-16 DIAGNOSIS — F4329 Adjustment disorder with other symptoms: Secondary | ICD-10-CM

## 2019-04-16 MED ORDER — LORAZEPAM 0.5 MG PO TABS: 0.5000 mg | ORAL_TABLET | Freq: Every day | ORAL | 1 refills | Status: DC

## 2019-04-17 ENCOUNTER — Other Ambulatory Visit: Payer: Self-pay | Admitting: Internal Medicine

## 2019-04-17 ENCOUNTER — Ambulatory Visit
Admission: RE | Admit: 2019-04-17 | Discharge: 2019-04-17 | Disposition: A | Discharge status: Home / Self Care | Payer: BC Managed Care – PPO | Source: Ambulatory Visit | Attending: Internal Medicine | Admitting: Internal Medicine

## 2019-04-17 DIAGNOSIS — R809 Proteinuria, unspecified: Secondary | ICD-10-CM

## 2019-04-17 DIAGNOSIS — R32 Unspecified urinary incontinence: Secondary | ICD-10-CM

## 2019-04-17 DIAGNOSIS — R319 Hematuria, unspecified: Secondary | ICD-10-CM

## 2019-04-17 DIAGNOSIS — R3129 Other microscopic hematuria: Secondary | ICD-10-CM | POA: Diagnosis not present

## 2019-04-21 DIAGNOSIS — H15013 Anterior scleritis, bilateral: Secondary | ICD-10-CM | POA: Diagnosis not present

## 2019-04-21 DIAGNOSIS — H53042 Amblyopia suspect, left eye: Secondary | ICD-10-CM | POA: Diagnosis not present

## 2019-04-22 DIAGNOSIS — J343 Hypertrophy of nasal turbinates: Secondary | ICD-10-CM | POA: Diagnosis not present

## 2019-04-22 DIAGNOSIS — J324 Chronic pansinusitis: Secondary | ICD-10-CM | POA: Diagnosis not present

## 2019-04-22 DIAGNOSIS — J310 Chronic rhinitis: Secondary | ICD-10-CM | POA: Diagnosis not present

## 2019-04-28 ENCOUNTER — Ambulatory Visit (INDEPENDENT_AMBULATORY_CARE_PROVIDER_SITE_OTHER): Payer: BC Managed Care – PPO | Admitting: Psychology

## 2019-04-28 DIAGNOSIS — F4322 Adjustment disorder with anxiety: Secondary | ICD-10-CM | POA: Diagnosis not present

## 2019-04-29 DIAGNOSIS — M3130 Wegener's granulomatosis without renal involvement: Secondary | ICD-10-CM | POA: Diagnosis not present

## 2019-04-29 DIAGNOSIS — M2550 Pain in unspecified joint: Secondary | ICD-10-CM | POA: Diagnosis not present

## 2019-04-29 DIAGNOSIS — R768 Other specified abnormal immunological findings in serum: Secondary | ICD-10-CM | POA: Diagnosis not present

## 2019-04-29 DIAGNOSIS — M0579 Rheumatoid arthritis with rheumatoid factor of multiple sites without organ or systems involvement: Secondary | ICD-10-CM | POA: Diagnosis not present

## 2019-05-01 DIAGNOSIS — Z79899 Other long term (current) drug therapy: Secondary | ICD-10-CM | POA: Diagnosis not present

## 2019-05-06 DIAGNOSIS — M545 Low back pain: Secondary | ICD-10-CM | POA: Diagnosis not present

## 2019-05-06 DIAGNOSIS — M5440 Lumbago with sciatica, unspecified side: Secondary | ICD-10-CM | POA: Diagnosis not present

## 2019-05-07 ENCOUNTER — Other Ambulatory Visit: Payer: Self-pay

## 2019-05-07 ENCOUNTER — Encounter: Payer: Self-pay | Admitting: Neurology

## 2019-05-07 ENCOUNTER — Ambulatory Visit: Payer: BC Managed Care – PPO | Admitting: Neurology

## 2019-05-07 VITALS — BP 168/108 | HR 99 | Temp 97.0°F | Ht 66.0 in | Wt 253.3 lb

## 2019-05-07 DIAGNOSIS — N059 Unspecified nephritic syndrome with unspecified morphologic changes: Secondary | ICD-10-CM

## 2019-05-07 DIAGNOSIS — I776 Arteritis, unspecified: Secondary | ICD-10-CM

## 2019-05-07 DIAGNOSIS — G609 Hereditary and idiopathic neuropathy, unspecified: Secondary | ICD-10-CM

## 2019-05-07 NOTE — Patient Instructions (Signed)
Your history and examination is indeed suggestive of neuropathy.  We will proceed with an EMG and nerve conduction velocity test in our office and keep you posted as to the test results.We will also do some blood work today in our office.  When you go to your primary care physician for your yearly physical, please make sure you have your vitamin D level and A1c rechecked.

## 2019-05-07 NOTE — Progress Notes (Signed)
Subjective:    Patient ID: Diane Wells is a 39 y.o. female.  HPI     Diane Foley, MD, PhD [facility] [address], [address] [address] [place], [place] [address]  Dear Dr. Wynetta Emery,   I saw your patient, Diane Wells, upon your kind request in my neurologic clinic today for initial consultation of her neuropathy concerns.  The patient is unaccompanied today.  As you know, Diane Wells is a 39 year old right-handed woman with an underlying medical history of asthma, diagnosis of rheumatoid arthritis last year, recent diagnosis of autoimmune vasculitis and glomerulonephritis, followed by rheumatology, history of neck pain with status post cervical spine surgery in October 2016, low back pain, and morbid obesity with a BMI of over 40, who reports numbness in her feet, particularly the toes and dorsi of both feet for the past nearly 1 year, more noticeable since November 2020.  She has had some painful sensations including burning sensations, some symptoms also in her fingers particularly her ring fingers.  She has had some cramping in her hands.  She had significant blood work done recently, she has been on methotrexate in the past for her RA and was switched to Humira which did not help.  She is currently on prednisone 60 mg daily since 04/09/2019 and is supposed to taper it to 40 mg daily slowly over the next few weeks.  She is waiting to start a new medication, as prescribed by rheumatology for her vasculitis. I reviewed your office note from 04/11/2019.  You had concerns that she may have ulnar neuropathy and also S1 nerve root radiculopathy.  She was advised to proceed with a lumbar spine MRI.  She had a lumbar spine MRI recently, results are pending.  She had significant blood work last year in March, including rheumatological markers, autoimmune markers, CK level.  She has a history of low vitamin D, she has physical scheduled with her primary care physician next month.   She has been on prescription vitamin D in the past.  She has recently seen ophthalmology for episcleritis.  She has been using steroid eyedrops and the oral steroids have helped as well. Her hands and feet feel cold, she also has intolerance to sudden temperature changes.  She sometimes has extreme sensitivity to even light touch in those areas of her distal lower extremities as well as her hands. She has been on gabapentin off and on for the past few years.  She has taken it more consistently since November 2020, currently takes 300 mg twice daily.  Her Past Medical History Is Significant For: Past Medical History:  Diagnosis Date  . Asthma   . Recurrent upper respiratory infection (URI)     Her Past Surgical History Is Significant For: Past Surgical History:  Procedure Laterality Date  . ADENOIDECTOMY    . ANTERIOR CERVICAL DECOMP/DISCECTOMY FUSION      Her Family History Is Significant For: Family History  Problem Relation Age of Onset  . Diabetes Mother   . Hypertension Mother   . Pulmonary Hypertension Mother   . Hyperthyroidism Mother   . Glaucoma Mother   . Neuropathy Mother   . Sjogren's syndrome Mother   . Carpal tunnel syndrome Mother   . Hypertension Father   . Gallbladder disease Father   . Hypertension Maternal Grandmother   . Glaucoma Maternal Grandmother   . Coronary artery disease Maternal Grandmother   . Heart attack Maternal Grandmother   . Angina Maternal Grandmother   .  Macular degeneration Maternal Grandmother   . COPD Maternal Grandmother   . Congestive Heart Failure Maternal Grandmother   . Leukemia Paternal Grandmother   . Hypertension Paternal Grandmother   . Colon cancer Paternal Grandfather   . Diabetes Maternal Uncle   . Heart attack Maternal Uncle   . Stroke Maternal Uncle   . Glaucoma Maternal Grandfather   . Allergic rhinitis Neg Hx   . Angioedema Neg Hx   . Asthma Neg Hx   . Eczema Neg Hx     Her Social History Is Significant  For: Social History   Socioeconomic History  . Marital status: Single    Spouse name: Not on file  . Number of children: Not on file  . Years of education: Not on file  . Highest education level: Not on file  Occupational History  . Not on file  Tobacco Use  . Smoking status: Never Smoker  . Smokeless tobacco: Never Used  Substance and Sexual Activity  . Alcohol use: No  . Drug use: No  . Sexual activity: Not on file  Other Topics Concern  . Not on file  Social History Narrative  . Not on file   Social Determinants of Health   Financial Resource Strain:   . Difficulty of Paying Living Expenses: Not on file  Food Insecurity:   . Worried About Programme researcher, broadcasting/film/video in the Last Year: Not on file  . Ran Out of Food in the Last Year: Not on file  Transportation Needs:   . Lack of Transportation (Medical): Not on file  . Lack of Transportation (Non-Medical): Not on file  Physical Activity:   . Days of Exercise per Week: Not on file  . Minutes of Exercise per Session: Not on file  Stress:   . Feeling of Stress : Not on file  Social Connections:   . Frequency of Communication with Friends and Family: Not on file  . Frequency of Social Gatherings with Friends and Family: Not on file  . Attends Religious Services: Not on file  . Active Member of Clubs or Organizations: Not on file  . Attends Banker Meetings: Not on file  . Marital Status: Not on file    Her Allergies Are:  Allergies  Allergen Reactions  . Penicillins Hives  . Sulfa Antibiotics   :   Her Current Medications Are:  Outpatient Encounter Medications as of 05/07/2019  Medication Sig  . butalbital-acetaminophen-caffeine (FIORICET) 50-325-40 MG tablet TAKE 1-2 TABLETS BY MOUTH EVERY 6 (SIX) HOURS AS NEEDED FOR HEADACHE. MAX 6 PER DAY  . dapsone 100 MG tablet Take 100 mg by mouth daily.  . diphenhydrAMINE (BENADRYL) 25 mg capsule Take 25 mg by mouth every 6 (six) hours as needed.  Marland Kitchen EPINEPHrine  (EPIPEN 2-PAK) 0.3 mg/0.3 mL IJ SOAJ injection Inject into the muscle once.  Marland Kitchen FLOVENT HFA 110 MCG/ACT inhaler Please specify directions, refills and quantity  . gabapentin (NEURONTIN) 300 MG capsule Take 1 capsule (300 mg total) by mouth 3 (three) times daily.  . Ibuprofen-Famotidine 800-26.6 MG TABS Take 1 tablet by mouth 3 (three) times daily.  Marland Kitchen LORazepam (ATIVAN) 0.5 MG tablet Take 1-2 tablets (0.5-1 mg total) by mouth at bedtime. Use as needed for short term insomnia  . losartan (COZAAR) 25 MG tablet   . methocarbamol (ROBAXIN) 750 MG tablet Take 1 tablet (750 mg total) by mouth every 8 (eight) hours as needed for muscle spasms.  . prednisoLONE acetate (PRED FORTE) 1 %  ophthalmic suspension 1 drop 3 (three) times daily.  . predniSONE (DELTASONE) 20 MG tablet 2 TABLET IN AM AND 1 TABLET IN THE EVENING ORALLY 30 DAY(S)  . PROAIR HFA 108 (90 Base) MCG/ACT inhaler Inhale 1 puff into the lungs daily as needed.   No facility-administered encounter medications on file as of 05/07/2019.  :   Review of Systems:  Out of a complete 14 point review of systems, all are reviewed and negative with the exception of these symptoms as listed below: Review of Systems  Neurological:       Reports she is here to discuss worsening neuropathy symptoms.     Objective:  Neurological Exam  Physical Exam Physical Examination:   Vitals:   05/07/19 1405  BP: (!) 168/108  Pulse: 99  Temp: (!) 97 F (36.1 C)    General Examination: The patient is a very pleasant 39 y.o. female in no acute distress. She appears well-developed and well-nourished and well groomed. Mildly anxious appearing  HEENT: Normocephalic, atraumatic, pupils are equal, round and reactive to light and accommodation. Funduscopic exam is normal with sharp disc margins noted. Extraocular tracking is good without limitation to gaze excursion or nystagmus noted. Normal smooth pursuit is noted. Hearing is grossly intact. Face is symmetric  with normal facial animation and normal facial sensation. Speech is clear with no dysarthria noted. There is no hypophonia. There is no lip, neck/head, jaw or voice tremor. Neck is supple with full range of passive and active motion. There are no carotid bruits on auscultation. Oropharynx exam reveals: mild mouth dryness, good dental hygiene. Tongue protrudes centrally and palate elevates symmetrically.   Chest: Clear to auscultation without wheezing, rhonchi or crackles noted.  Heart: S1+S2+0, regular and normal without murmurs, rubs or gallops noted.   Abdomen: Soft, non-tender and non-distended with normal bowel sounds appreciated on auscultation.  Extremities: There is no pitting edema in the distal lower extremities bilaterally.   Skin: Hands and feet are cooler to touch, mild mottling of the skin is noted.    Musculoskeletal: exam reveals no obvious joint deformities, tenderness or joint swelling or erythema.   Neurologically:  Mental status: The patient is awake, alert and oriented in all 4 spheres. Her immediate and remote memory, attention, language skills and fund of knowledge are appropriate. There is no evidence of aphasia, agnosia, apraxia or anomia. Speech is clear with normal prosody and enunciation. Thought process is linear. Mood is normal and affect is normal.  Cranial nerves II - XII are as described above under HEENT exam. In addition: shoulder shrug is normal with equal shoulder height noted. Motor exam: Normal bulk, strength and tone is noted. There is no drift, tremor or rebound. Romberg is negative. Reflexes are 2+ throughout. Babinski: Toes are flexor bilaterally. Fine motor skills and coordination: intact with normal finger taps, normal hand movements, normal rapid alternating patting, normal foot taps and normal foot agility.  Cerebellar testing: No dysmetria or intention tremor on finger to nose testing. Heel to shin is unremarkable bilaterally. There is no truncal or  gait ataxia.  Sensory exam: intact to light touch, pinprick, vibration, temperature sense in the upper and lower extremities, but Has decreased sensation to temperature and pinprick in the distal lower extremities, up to above ankle areas bilaterally.  She does not have any significant hypersensitivity, no allodynia.  Gait, station and balance: She stands easily. No veering to one side is noted. No leaning to one side is noted. Posture is age-appropriate and  stance is narrow based. Gait shows normal stride length and normal pace. No problems turning are noted. Tandem walk is unremarkable.    Assessment and Plan:   In summary, Esmerelda Finnigan is a very pleasant 39 y.o.-year old female with an underlying medical history of asthma, diagnosis of rheumatoid arthritis last year, recent diagnosis of autoimmune vasculitis and glomerulonephritis, followed by rheumatology, history of neck pain with status post cervical spine surgery in October 2016, low back pain, and morbid obesity with a BMI of over 40, who Presents for evaluation of her numbness in her feet as well as pain, history and examination suggestive of peripheral neuropathy.  She had a recent lumbar spine MRI through your office and has a follow-up appointment pending as she also has low back pain.  She had neck surgery some years ago.  We will proceed with additional testing in the form of EMG nerve conduction velocity test in our office.  We will call her with her test results and proceed from there.  We will just do additional lab testing today here and she is advised to schedule her yearly checkup with her primary care physician and have her vitamin D level and A1c rechecked with her PCP.  She does have a history of low vitamin D and prescription vitamin D from time to time. She is on gabapentin 300 mg twice daily.  She has no obvious weakness.  She has no obvious other focal neurological signs thankfully.  She has been seen by ophthalmology  recently.  She is in close follow-up with her rheumatologist and is pending to start a new medication soon.  We will keep her posted as to her Test results from our office.  I answered all her questions today and she was in agreement. Thank you very much for allowing me to participate in the care of this nice patient. If I can be of any further assistance to you please do not hesitate to call me at [phone].  Sincerely,   Star Age, MD, PhD

## 2019-05-08 DIAGNOSIS — G629 Polyneuropathy, unspecified: Secondary | ICD-10-CM | POA: Diagnosis not present

## 2019-05-08 DIAGNOSIS — M5440 Lumbago with sciatica, unspecified side: Secondary | ICD-10-CM | POA: Diagnosis not present

## 2019-05-09 LAB — HEAVY METALS PROFILE II, BLOOD
Arsenic: 6 ug/L (ref 2–23)
Cadmium: <0.5 ug/L (ref 0.0–1.2)
Lead, Blood: <1 ug/dL (ref 0–4)
Mercury: <1 ug/L (ref 0.0–14.9)

## 2019-05-09 LAB — TSH: TSH: 0.837 u[IU]/mL (ref 0.450–4.500)

## 2019-05-09 LAB — B12 AND FOLATE PANEL
Folate: 15.2 ng/mL (ref >3.0)
Vitamin B-12: 336 pg/mL (ref 232–1245)

## 2019-05-12 ENCOUNTER — Telehealth: Payer: Self-pay

## 2019-05-12 NOTE — Telephone Encounter (Signed)
-----   Message from [name], MD sent at 05/12/2019 12:55 PM EST ----- Labs were normal, please update patient.

## 2019-05-12 NOTE — Telephone Encounter (Signed)
I reached out to the pt and advised of lab results. She verbalized understanding.  

## 2019-05-12 NOTE — Progress Notes (Signed)
Labs were normal, please update patient.

## 2019-05-13 ENCOUNTER — Encounter: Payer: Self-pay | Admitting: Internal Medicine

## 2019-05-13 ENCOUNTER — Encounter: Payer: Self-pay | Admitting: Family Medicine

## 2019-05-13 ENCOUNTER — Other Ambulatory Visit: Payer: Self-pay

## 2019-05-13 ENCOUNTER — Ambulatory Visit: Payer: BC Managed Care – PPO | Admitting: Internal Medicine

## 2019-05-13 VITALS — BP 134/94 | HR 94 | Temp 97.2°F | Ht 66.0 in | Wt 258.4 lb

## 2019-05-13 DIAGNOSIS — J4540 Moderate persistent asthma, uncomplicated: Secondary | ICD-10-CM | POA: Diagnosis not present

## 2019-05-13 DIAGNOSIS — F1920 Other psychoactive substance dependence, uncomplicated: Secondary | ICD-10-CM

## 2019-05-13 DIAGNOSIS — I2729 Other secondary pulmonary hypertension: Secondary | ICD-10-CM

## 2019-05-13 DIAGNOSIS — M3131 Wegener's granulomatosis with renal involvement: Secondary | ICD-10-CM

## 2019-05-13 DIAGNOSIS — M3130 Wegener's granulomatosis without renal involvement: Secondary | ICD-10-CM

## 2019-05-13 DIAGNOSIS — I776 Arteritis, unspecified: Secondary | ICD-10-CM

## 2019-05-13 MED ORDER — ALBUTEROL SULFATE (2.5 MG/3ML) 0.083% IN NEBU: 2.5000 mg | INHALATION_SOLUTION | Freq: Four times a day (QID) | RESPIRATORY_TRACT | 12 refills | Status: DC | PRN

## 2019-05-13 NOTE — Progress Notes (Signed)
Diane Wells    [id number]    08-11-1980  Primary Care Physician:Copland, Gay Filler, MD  Referring Physician: Darreld Mclean, [address] [place],  [place] [address] Reason for Consultation: Granulomatosis with Polyangiitis Date of Consultation: 05/13/2019  Chief complaint:   Chief Complaint  Patient presents with  . 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  I have detailed her asthma history below.  Her more recent history begins in the last year when she developed arthralgias and neuropathy as well as recurrent sinus issues in early 2020.  She was initially treated for rheumatoid arthritis.  However when she started having recurrent episodes of vasculitis in her lower extremities, she was diagnosed with granulomatosis with polyangiitis.  Subsequently has met with rheumatology - Dr. Trudie Reed at [facility] Dr. Rexene Alberts in Neurology Dr. 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 Johnney Ou. Biopsy deferred due to presumptive diagnosis ultrasound findings, and proteinuria and hematuria.  Currently on 22m prednisone taper - tomorrow goes to 50 mg. On dapsone.  Denied Rituximab due to insurance issues - starting something biosimilar next Thursday.  Ongoing symptoms: neuropathy, lower extremity arthralgias, sinusitis.    Asthma history: Always had seasonal allergies. At age [age]started having breathing difficulties and had a history of anaphylaxis to essential oils around that time. Has seen allergy in the past and was diagnosed with asthma. She had SPT and was told that she was allergic to multiple grasses, molds, trees and cats. Started SCIT but was unable to keep up with them due to scheduling difficulties. Did them for about 6 months. Last two years she has been off all ICS and  occasionally had chest tightness with activity and with seasonal variation.   Current Regimen: [id number] HFT 2 puffs BID(costing her $200/month) so she takes as needed or during seasonal changes, albuterol (taking 1-2 times/month.) Asthma Triggers: seasonal allergies Exacerbations in the last year: 1 (prior to prednisone for RA, which was in march) History of hospitalization or intubation: never been hospitalized Hives: has had anaphylaxis in the past with possible essential oil exposure.  Allergy Testing: yes, has had SPT to multiple environmental allergies above.  GERD: denies Allergic Rhinitis: yes with sinus problems ACT:  Asthma Control Test ACT Total Score  05/13/2019 18  02/15/2016 12   FeNO: never had   Social history:  Occupation: pEnvironmental education officerat aHigher education careers [employer] [place]: never smoker Lives independently, moving in with her parents and taking a leave of absence from work  Social History   Occupational History  . Not on file  Tobacco Use  . Smoking status: Never Smoker  . Smokeless tobacco: Never Used  Substance and Sexual Activity  . Alcohol use: No  . Drug use: No  . Sexual activity: Not on file    Relevant family history:  Family History  Problem Relation Age of Onset  . Diabetes Mother   . Hypertension Mother   . Pulmonary Hypertension Mother   . Hyperthyroidism Mother   . Glaucoma Mother   . Neuropathy Mother   . Sjogren's syndrome Mother   . Carpal tunnel syndrome Mother   . Hypertension Father   . Gallbladder disease Father   . Hypertension Maternal Grandmother   . Glaucoma Maternal Grandmother   . Coronary artery disease  Maternal Grandmother   . Heart attack Maternal Grandmother   . Angina Maternal Grandmother   . Macular degeneration Maternal Grandmother   . COPD Maternal Grandmother   . Congestive Heart Failure Maternal Grandmother   . Leukemia Paternal Grandmother   . Hypertension Paternal Grandmother   . Colon cancer  Paternal Grandfather   . Diabetes Maternal Uncle   . Heart attack Maternal Uncle   . Stroke Maternal Uncle   . Glaucoma Maternal Grandfather   . Allergic rhinitis Neg Hx   . Angioedema Neg Hx   . Asthma Neg Hx   . Eczema Neg Hx     Past Medical History:  Diagnosis Date  . Allergic rhinitis   . Asthma   . Chronic kidney disease   . Hypertension   . Recurrent upper respiratory infection (URI)     Past Surgical History:  Procedure Laterality Date  . ADENOIDECTOMY    . ANTERIOR CERVICAL DECOMP/DISCECTOMY FUSION      Review of systems: Review of Systems  Constitutional: Negative for chills, fever and weight loss.  HENT: Positive for congestion and sinus pain. Negative for sore throat.   Eyes: Negative for discharge and redness.  Respiratory: Positive for cough and shortness of breath. Negative for hemoptysis, sputum production and wheezing.   Cardiovascular: Positive for palpitations and leg swelling. Negative for chest pain.  Gastrointestinal: Negative for heartburn, nausea and vomiting.  Musculoskeletal: Positive for joint pain. Negative for myalgias.  Skin: Positive for rash.  Neurological: Positive for headaches. Negative for dizziness, tremors and focal weakness.  Endo/Heme/Allergies: Positive for environmental allergies.  Psychiatric/Behavioral: Negative for depression. The patient is nervous/anxious.   All other systems reviewed and are negative.   Physical Exam: Blood pressure (!) 134/94, pulse 94, temperature (!) 97.2 F (36.2 C), temperature source Temporal, height '5\' 6"'  (1.676 m), weight 258 lb 6.4 oz (117.2 kg), SpO2 95 %. Gen:      No acute distress Eyes: EOMI, sclera anicteric. mallampati IV ENT:  no nasal polyps,right nasal septum ulceration, diffuse erythema. mucus membranes moist Neck:     Supple, no thyromegaly Lungs:    No increased respiratory effort, symmetric chest wall excursion, clear to auscultation bilaterally, no wheezes or crackles CV:          Regular rate and rhythm; no murmurs, rubs, or gallops.  No pedal edema MSK: mild erythema of the mcp joints Skin:      Warm and dry. Lower extre Neuro: normal speech, no focal facial asymmetry. Ambulates with cane.  Psych: alert and oriented x3, anxious   Data Reviewed/Medical Decision Making:  Independent interpretation of tests: Imaging: . Review of patient's renal ultrasound and CT maxillofacial images revealed no hydronephrosis in the kidneys, no retro-orbital masses or saddlenose deformity. The patient's images have been independently reviewed by me.    PFTs: I have personally reviewed the patient's PFTs and spirometry from 2018 was normal - this was an in office spirometry done. This was an incomplete test and no inspiratory maneuver was performed.   Labs: reviewed from patients portal with her on her phone.  Anti- MPO negative PR-3 elevated to 6.6 (0-3.5 nl) C-ANCA 1:40 (1:20 normal) P-ANCA not elevated +RF Elevated ESR  Jan 2021 UA - 2+ protein, 2+ blood Jan 2021 Cr - 1.17 on prednisone 60 mg (was previously higher at 1.19 in Nov off steroids)  Immunization status:  Immunization History  Administered Date(s) Administered  . Influenza,inj,Quad PF,6+ Mos 01/10/2016, 05/16/2018, 01/28/2019  . Td 02/24/2016  .  Tdap 03/27/2005    . I reviewed prior external note(s) from Dr. Luan Pulling, Dr. Lorelei Pont, Dr. Rexene Alberts . I reviewed the result(s) of the labs and imaging as noted above.  . 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 Abrams has a diagnosis of GPA with so far renal, cutaneous, and potentially pulmonary involvement.  We will proceed with a Full set of PFTs - including FeNO, DLCO, Spirometry and lung volumes to assess for ILD, asthma severity.  Possible her asthma symptoms were GPA the entire time.  We will also proceed with Echo for [facility] screen.  She is  due to start biologic therapy next week.  For now okay for her to stay off inhaled corticosteroid due to cost since she is on such high-dose steroids.  Once she is down to 40 mg prednisone would like her to start an ICS/LABA for her asthma.  She will keep me posted on what is covered between [place] and [place].  Return to Care: Return in about 3 months (around 08/10/2019).  Lenice Llamas, MD Pulmonary and [place]  CC: Copland, Gay Filler, MD

## 2019-05-13 NOTE — Addendum Note (Signed)
Addended by: Vianne Bulls R on: 05/13/2019 03:04 PM   Modules accepted: Orders

## 2019-05-13 NOTE — Addendum Note (Signed)
Addended by: Vianne Bulls R on: 05/13/2019 02:39 PM   Modules accepted: Orders

## 2019-05-13 NOTE — Progress Notes (Signed)
   Subjective:    Patient ID: Zoila Shutter, female    DOB: 1980-06-20, 39 y.o.   MRN: [id number]  HPI    Review of Systems  Constitutional: Negative for fever and unexpected weight change.  HENT: Positive for congestion, nosebleeds and sinus pressure. Negative for dental problem, ear pain, postnasal drip, rhinorrhea, sneezing, sore throat and trouble swallowing.   Eyes: Positive for redness. Negative for itching.  Respiratory: Positive for cough and shortness of breath. Negative for chest tightness and wheezing.   Cardiovascular: Positive for palpitations and leg swelling.  Gastrointestinal: Negative for nausea and vomiting.  Genitourinary: Positive for dysuria.  Musculoskeletal: Positive for joint swelling.  Skin: Negative for rash.  Allergic/Immunologic: Negative.  Negative for environmental allergies, food allergies and immunocompromised state.  Neurological: Negative for headaches.  Hematological: Bruises/bleeds easily.  Psychiatric/Behavioral: The patient is nervous/anxious.        Objective:   Physical Exam        Assessment & Plan:

## 2019-05-13 NOTE — Patient Instructions (Addendum)
The patient should have follow up scheduled with myself in 3 months.   Prior to next visit patient should have a full set of PFTs including:  Spirometry with bronchodilator if obstructed Lung Volumes DLCO FeNO   Tests to schedule: See endocrinology for DEXA scan evaluation Echocardiogram PFTs  Meds for asthma to look up with formulary: Advair, Dulera inhalers HFA/DPI Fluticasone-salmeterol mometasone -fomoterol

## 2019-05-14 ENCOUNTER — Ambulatory Visit (INDEPENDENT_AMBULATORY_CARE_PROVIDER_SITE_OTHER): Payer: BC Managed Care – PPO | Admitting: Psychology

## 2019-05-14 DIAGNOSIS — F4322 Adjustment disorder with anxiety: Secondary | ICD-10-CM | POA: Diagnosis not present

## 2019-05-15 ENCOUNTER — Encounter: Payer: BC Managed Care – PPO | Admitting: Family Medicine

## 2019-05-17 NOTE — Progress Notes (Addendum)
[facility] [address] [place], [place] [address] [phone]  Date:  05/19/2019   Name:  Diane Wells   DOB:  07/18/1980   MRN:  [id number]  PCP:  Pearline Cables, MD    Chief Complaint: Annual Exam (lab work) and Medication Refill (epi pen)   History of Present Illness:  Diane Wells is a 39 y.o. very pleasant female patient who presents with the following:  Patient with history of asthma allergies, obesity, hypertension She has recently suffered from symptoms of arthralgias and neuropathy, and recurrent sinus issues.  She was recently diagnosed with RA, but then began having vasculitis in her lower extremities.  More recently she has been diagnosed with granulomatosis with polyangitis She has renal, cutaneous, potentially pulmonary involvement of her disease  Eosinophilic granulomatosis with polyangiitis (Churg-Strauss) abbreviated EGPA, which was previously called the Churg-Strauss syndrome (CSS) or allergic granulomatosis and angiitis, is a multisystem disorder characterized by allergic rhinitis, asthma, and prominent peripheral blood eosinophilia [1-9]. EGPA is classified as a vasculitis of the small and medium sized arteries, although the vasculitis is often not apparent in the initial phases of the disease. The most commonly involved organ is the lung, followed by the skin. EGPA, however, can affect any organ system, including the cardiovascular, gastrointestinal, renal, and central nervous systems. Vasculitis of extrapulmonary organs is largely responsible for the morbidity and mortality associated with EGPA.  Pathologic findings in different organs include: ?In the lung, asthmatic bronchitis, eosinophilic pneumonia, extravascular granulomas, or vasculitis (affecting arteries, veins, or capillaries) may be seen. In some cases, the inflammatory lesions extend along the pleura and interlobular  septa. The granulomas in EGPA typically have a border of palisading histiocytes and multinucleated giant cells surrounding a central necrotic zone consisting of the necrotic eosinophils. The vascular infiltrates are often composed of chronic inflammatory cells, eosinophilic infiltrates, epithelioid histiocytes, multinucleated giant cells and/or neutrophils. Diffuse pulmonary hemorrhage and capillaritis may be seen. ?In the kidney, necrotizing crescentic glomerulonephritis is the most common finding, but eosinophilic interstitial nephritis, mesangial glomerulonephritis, and focal segmental glomerulosclerosis are also seen [57,58]. ?Endomyocardial biopsies typically reveal eosinophilic infiltration and endomyocarditis, but not vasculitis [59]. ?Skin biopsy typically reveals a leukocytoclastic vasculitis with eosinophil infiltration [60]. Palisading granulomas and/or eosinophilic infiltration of dermal nerve fibers may also be noted   Her care team consists of Rheumatology - Dr. Nickola Major at [facility] Rheumatology She is getting her first infusion this week Truxima/rituximab for her RA and GPA Dr. Frances Furbish in Neurology, she is being referred to a specialist  Dr. 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 Glenna Fellows. Biopsy deferred due to presumptive diagnosis ultrasound findings, and proteinuria and hematuria. Dr. Celine Mans pulmonology  She would like to do a Pap today- never had an abnormal pap in the past  Per neurology, needs vitamin D and A1c She has noted muscle cramps, would appreciate a potassium and magnesium level  I recently ordered an echo and DEXA scan for her She is taking 50 mg of prednisone a day- her max was 60 mg for about 8 weeks   She is not sleeping well at all- from both anxiety and her prednisone She is staying with her parents right now and this is helping She is taking 1mg  of lorazepam - 0.5 does not help her    Patient Active Problem List   Diagnosis Date Noted  . Patellar  dislocation 01/17/2018  . Acute left ankle pain 11/27/2017  . Allergic reaction 02/15/2016  . Moderate persistent asthma 02/15/2016  . Perennial and seasonal allergic rhinitis 02/15/2016  . Allergic conjunctivitis 02/15/2016  . Obesity, unspecified obesity severity, unspecified obesity type 01/10/2016    Past Medical History:  Diagnosis Date  . Allergic rhinitis   . Asthma   . Chronic kidney disease   . Hypertension   . Recurrent upper respiratory infection (URI)     Past Surgical History:  Procedure Laterality Date  . ADENOIDECTOMY    . ANTERIOR CERVICAL DECOMP/DISCECTOMY FUSION      Social History   Tobacco Use  . Smoking status: Never Smoker  . Smokeless tobacco: Never Used  Substance Use Topics  . Alcohol use: No  . Drug use: No    Family History  Problem Relation Age of Onset  . Diabetes Mother        Type 1 Diabetes, juvenile onset  . Hypertension Mother   . Pulmonary Hypertension Mother   . Hyperthyroidism Mother   . Glaucoma Mother   . Neuropathy Mother   . Sjogren's syndrome Mother   . Carpal tunnel syndrome Mother   . Hypertension Father   . Gallbladder disease Father   . Hypertension Maternal Grandmother   . Glaucoma Maternal Grandmother   . Coronary artery disease Maternal Grandmother   . Heart attack Maternal Grandmother   . Angina Maternal Grandmother   . Macular degeneration Maternal Grandmother   . COPD Maternal Grandmother   . Congestive Heart Failure Maternal Grandmother   . Leukemia Paternal Grandmother   . Hypertension Paternal Grandmother   . Colon cancer Paternal Grandfather   . Diabetes Maternal Uncle   . Heart attack Maternal Uncle   . Stroke Maternal Uncle   . Glaucoma Maternal Grandfather   . Allergic rhinitis Neg Hx   . Angioedema Neg Hx   . Asthma Neg Hx   . Eczema Neg Hx     Allergies  Allergen Reactions  . Other Anaphylaxis    Essential Oil, scent  unknown, caused tongue swelling  . Penicillins Hives  . Sulfa Antibiotics     Medication list has been reviewed and updated.  Current Outpatient Medications on File Prior to Visit  Medication Sig Dispense Refill  . albuterol (PROVENTIL) (2.5 MG/3ML) 0.083% nebulizer solution Take 3 mLs (2.5 mg total) by nebulization every 6 (six) hours as needed for wheezing or shortness of breath. 75 mL 12  . butalbital-acetaminophen-caffeine (FIORICET) 50-325-40 MG tablet TAKE 1-2 TABLETS BY MOUTH EVERY 6 (SIX) HOURS AS NEEDED FOR HEADACHE. MAX 6 PER DAY 30 tablet 1  . dapsone 100 MG tablet Take 100 mg by mouth daily.    Marland Kitchen gabapentin (NEURONTIN) 300 MG capsule Take 1 capsule (300 mg total) by mouth 3 (three) times daily. 90 capsule 5  . LORazepam (ATIVAN) 0.5 MG tablet Take 1-2 tablets (0.5-1 mg total) by mouth at bedtime. Use as needed for short term insomnia 30 tablet 1  . losartan (COZAAR) 25 MG tablet     . methocarbamol (ROBAXIN) 750 MG tablet Take 1 tablet (750 mg total) by mouth every 8 (eight) hours as needed for muscle spasms. 90 tablet 3  . prednisoLONE acetate (PRED FORTE) 1 % ophthalmic suspension 1 drop 3 (three) times daily.    . predniSONE (DELTASONE) 20 MG tablet 2 TABLET IN AM AND 1 TABLET IN THE EVENING ORALLY 30 DAY(S)    . PROAIR HFA 108 (90 Base) MCG/ACT inhaler  Inhale 1 puff into the lungs daily as needed. 1 Inhaler 12   No current facility-administered medications on file prior to visit.    Review of Systems:  As per HPI- otherwise negative.  LMP started 1 week ago Physical Examination: Vitals:   05/19/19 1301  BP: 134/88  Pulse: 87  Resp: 17  Temp: (!) 96.2 F (35.7 C)  SpO2: 93%   Vitals:   05/19/19 1301  Weight: 258 lb (117 kg)  Height: 5\' 6"  (1.676 m)   Body mass index is 41.64 kg/m. Ideal Body Weight: Weight in (lb) to have BMI = 25: 154.6  GEN: no acute distress..  Obese, appears overall well HEENT: Atraumatic, Normocephalic.  Ears and Nose: No external  deformity. CV: RRR, No M/G/R. No JVD. No thrill. No extra heart sounds. PULM: CTA B, no wheezes, crackles, rhonchi. No retractions. No resp. distress. No accessory muscle use. ABD: S, NT, ND, +BS. No rebound. No HSM. EXTR: No c/c/e PSYCH: Normally interactive. Conversant. Using a cane for ambulation Pap today.  Normal external genitals, normal vagina, normal cervix.   Assessment and Plan: Granulomatosis with polyangiitis with renal involvement (HCC) - Plan: Vitamin D (25 hydroxy), Basic metabolic panel, Magnesium  Autoimmune disorder (HCC)  Screening for cervical cancer - Plan: Cytology - PAP  Screening for diabetes mellitus - Plan: Hemoglobin A1c  History of anaphylaxis - Plan: EPINEPHrine (EPIPEN 2-PAK) 0.3 mg/0.3 mL IJ SOAJ injection  Here today for follow-up visit.  Unfortunately Diane Wells has been diagnosed with granulomatosis with polyangiitis.  We offered her support and encouragement today, I am hopeful that her upcoming infusion therapy will be very helpful.  At this time she declines treatment for depression, she is still using lorazepam for insomnia. She is taking 1 mg at 0.5 was not helpful.  We discussed this today-other chronic use of lorazepam is not ideal, I do feel she would do better if she is able to get some sleep.  For the time being we will continue to use lorazepam at bedtime  Pap today  Labs pending as above  Refilled EpiPen  This visit occurred during the SARS-CoV-2 public health emergency.  Safety protocols were in place, including screening questions prior to the visit, additional usage of staff PPE, and extensive cleaning of exam room while observing appropriate contact time as indicated for disinfecting solutions.     Signed Toni Amend, MD   Received her labs 2/23, message to patient  Results for orders placed or performed in visit on 05/19/19  Hemoglobin A1c  Result Value Ref Range   Hgb A1c MFr Bld 4.4 (L) 4.6 - 6.5 %  Vitamin D (25  hydroxy)  Result Value Ref Range   VITD 22.83 (L) 30.00 - 100.00 ng/mL  Basic metabolic panel  Result Value Ref Range   Sodium 132 (L) 135 - 145 mEq/L   Potassium 4.5 3.5 - 5.1 mEq/L   Chloride 98 96 - 112 mEq/L   CO2 27 19 - 32 mEq/L   Glucose, Bld 93 70 - 99 mg/dL   BUN 33 (H) 6 - 23 mg/dL   Creatinine, Ser 05/21/19 0.40 - 1.20 mg/dL   GFR 9.93 71.69 mL/min   Calcium 8.6 8.4 - 10.5 mg/dL  Magnesium  Result Value Ref Range   Magnesium 2.0 1.5 - 2.5 mg/dL

## 2019-05-18 DIAGNOSIS — M3131 Wegener's granulomatosis with renal involvement: Secondary | ICD-10-CM | POA: Diagnosis not present

## 2019-05-18 DIAGNOSIS — D8989 Other specified disorders involving the immune mechanism, not elsewhere classified: Secondary | ICD-10-CM | POA: Diagnosis not present

## 2019-05-18 DIAGNOSIS — Z124 Encounter for screening for malignant neoplasm of cervix: Secondary | ICD-10-CM | POA: Diagnosis not present

## 2019-05-18 DIAGNOSIS — Z131 Encounter for screening for diabetes mellitus: Secondary | ICD-10-CM | POA: Diagnosis not present

## 2019-05-19 ENCOUNTER — Other Ambulatory Visit (HOSPITAL_COMMUNITY)
Admission: RE | Admit: 2019-05-19 | Discharge: 2019-05-19 | Disposition: A | Discharge status: Home / Self Care | Payer: BC Managed Care – PPO | Source: Ambulatory Visit | Attending: Family Medicine | Admitting: Family Medicine

## 2019-05-19 ENCOUNTER — Encounter: Payer: Self-pay | Admitting: Family Medicine

## 2019-05-19 ENCOUNTER — Other Ambulatory Visit: Payer: Self-pay

## 2019-05-19 ENCOUNTER — Ambulatory Visit (HOSPITAL_BASED_OUTPATIENT_CLINIC_OR_DEPARTMENT_OTHER)
Admission: RE | Admit: 2019-05-19 | Discharge: 2019-05-19 | Disposition: A | Discharge status: Home / Self Care | Payer: BC Managed Care – PPO | Source: Ambulatory Visit | Attending: Family Medicine | Admitting: Family Medicine

## 2019-05-19 ENCOUNTER — Ambulatory Visit (INDEPENDENT_AMBULATORY_CARE_PROVIDER_SITE_OTHER): Payer: BC Managed Care – PPO | Admitting: Family Medicine

## 2019-05-19 VITALS — BP 134/88 | HR 87 | Temp 96.2°F | Resp 17 | Ht 66.0 in | Wt 258.0 lb

## 2019-05-19 DIAGNOSIS — F1920 Other psychoactive substance dependence, uncomplicated: Secondary | ICD-10-CM

## 2019-05-19 DIAGNOSIS — F4329 Adjustment disorder with other symptoms: Secondary | ICD-10-CM

## 2019-05-19 DIAGNOSIS — Z124 Encounter for screening for malignant neoplasm of cervix: Secondary | ICD-10-CM | POA: Insufficient documentation

## 2019-05-19 DIAGNOSIS — M3131 Wegener's granulomatosis with renal involvement: Secondary | ICD-10-CM | POA: Insufficient documentation

## 2019-05-19 DIAGNOSIS — Z13820 Encounter for screening for osteoporosis: Secondary | ICD-10-CM | POA: Diagnosis not present

## 2019-05-19 DIAGNOSIS — Z1151 Encounter for screening for human papillomavirus (HPV): Secondary | ICD-10-CM | POA: Insufficient documentation

## 2019-05-19 DIAGNOSIS — D8989 Other specified disorders involving the immune mechanism, not elsewhere classified: Secondary | ICD-10-CM | POA: Diagnosis not present

## 2019-05-19 DIAGNOSIS — R87615 Unsatisfactory cytologic smear of cervix: Secondary | ICD-10-CM | POA: Diagnosis not present

## 2019-05-19 DIAGNOSIS — Z131 Encounter for screening for diabetes mellitus: Secondary | ICD-10-CM

## 2019-05-19 DIAGNOSIS — Z87892 Personal history of anaphylaxis: Secondary | ICD-10-CM

## 2019-05-19 LAB — VITAMIN D 25 HYDROXY (VIT D DEFICIENCY, FRACTURES): VITD: 22.83 ng/mL — ABNORMAL LOW (ref 30.00–100.00)

## 2019-05-19 LAB — HEMOGLOBIN A1C: Hgb A1c MFr Bld: 4.4 % — ABNORMAL LOW (ref 4.6–6.5)

## 2019-05-19 MED ORDER — EPINEPHRINE 0.3 MG/0.3ML IJ SOAJ: 0.3000 mg | Freq: Once | INTRAMUSCULAR | 99 refills | Status: AC

## 2019-05-19 NOTE — Patient Instructions (Signed)
It was great to see you again today- I am sorry you are suffering at this time.  Please let me know what I can do to help and hang in there.  I hope that your infusions will be very helpful for you  I will be in touch with your labs Let me know when you need more lorazepam

## 2019-05-20 ENCOUNTER — Encounter: Payer: Self-pay | Admitting: Family Medicine

## 2019-05-20 LAB — MAGNESIUM: Magnesium: 2 mg/dL (ref 1.5–2.5)

## 2019-05-20 LAB — BASIC METABOLIC PANEL
BUN: 33 mg/dL — ABNORMAL HIGH (ref 6–23)
CO2: 27 mEq/L (ref 19–32)
Calcium: 8.6 mg/dL (ref 8.4–10.5)
Chloride: 98 mEq/L (ref 96–112)
Creatinine, Ser: 1.02 mg/dL (ref 0.40–1.20)
GFR: 60.54 mL/min (ref >60.00)
Glucose, Bld: 93 mg/dL (ref 70–99)
Potassium: 4.5 mEq/L (ref 3.5–5.1)
Sodium: 132 mEq/L — ABNORMAL LOW (ref 135–145)

## 2019-05-21 ENCOUNTER — Encounter: Payer: Self-pay | Admitting: Family Medicine

## 2019-05-21 LAB — CYTOLOGY - PAP
Adequacy: ABNORMAL
Comment: NEGATIVE
High risk HPV: NEGATIVE

## 2019-05-22 DIAGNOSIS — T887XXA Unspecified adverse effect of drug or medicament, initial encounter: Secondary | ICD-10-CM | POA: Diagnosis not present

## 2019-05-22 DIAGNOSIS — M0579 Rheumatoid arthritis with rheumatoid factor of multiple sites without organ or systems involvement: Secondary | ICD-10-CM | POA: Diagnosis not present

## 2019-05-22 DIAGNOSIS — T50905A Adverse effect of unspecified drugs, medicaments and biological substances, initial encounter: Secondary | ICD-10-CM | POA: Diagnosis not present

## 2019-05-23 ENCOUNTER — Encounter (HOSPITAL_BASED_OUTPATIENT_CLINIC_OR_DEPARTMENT_OTHER): Payer: Self-pay

## 2019-05-23 ENCOUNTER — Emergency Department (HOSPITAL_BASED_OUTPATIENT_CLINIC_OR_DEPARTMENT_OTHER)
Admission: EM | Admit: 2019-05-23 | Discharge: 2019-05-23 | Disposition: A | Discharge status: Home / Self Care | Payer: BC Managed Care – PPO | Attending: Emergency Medicine | Admitting: Emergency Medicine

## 2019-05-23 ENCOUNTER — Telehealth: Payer: Self-pay | Admitting: Internal Medicine

## 2019-05-23 ENCOUNTER — Emergency Department (HOSPITAL_BASED_OUTPATIENT_CLINIC_OR_DEPARTMENT_OTHER): Payer: BC Managed Care – PPO

## 2019-05-23 ENCOUNTER — Other Ambulatory Visit: Payer: Self-pay

## 2019-05-23 DIAGNOSIS — N189 Chronic kidney disease, unspecified: Secondary | ICD-10-CM | POA: Diagnosis not present

## 2019-05-23 DIAGNOSIS — R0609 Other forms of dyspnea: Secondary | ICD-10-CM | POA: Diagnosis not present

## 2019-05-23 DIAGNOSIS — Z79899 Other long term (current) drug therapy: Secondary | ICD-10-CM | POA: Diagnosis not present

## 2019-05-23 DIAGNOSIS — I129 Hypertensive chronic kidney disease with stage 1 through stage 4 chronic kidney disease, or unspecified chronic kidney disease: Secondary | ICD-10-CM | POA: Insufficient documentation

## 2019-05-23 DIAGNOSIS — Z880 Allergy status to penicillin: Secondary | ICD-10-CM | POA: Diagnosis not present

## 2019-05-23 DIAGNOSIS — R0600 Dyspnea, unspecified: Secondary | ICD-10-CM

## 2019-05-23 DIAGNOSIS — Z882 Allergy status to sulfonamides status: Secondary | ICD-10-CM | POA: Insufficient documentation

## 2019-05-23 DIAGNOSIS — M301 Polyarteritis with lung involvement [Churg-Strauss]: Secondary | ICD-10-CM | POA: Insufficient documentation

## 2019-05-23 DIAGNOSIS — D7218 Eosinophilia in diseases classified elsewhere: Secondary | ICD-10-CM

## 2019-05-23 DIAGNOSIS — R0602 Shortness of breath: Secondary | ICD-10-CM | POA: Diagnosis not present

## 2019-05-23 HISTORY — DX: Wegener's granulomatosis without renal involvement: M31.30

## 2019-05-23 LAB — CBC WITH DIFFERENTIAL/PLATELET
Abs Immature Granulocytes: 0.31 10*3/uL — ABNORMAL HIGH (ref 0.00–0.07)
Basophils Absolute: 0 10*3/uL (ref 0.0–0.1)
Basophils Relative: 0 %
Eosinophils Absolute: 0.1 10*3/uL (ref 0.0–0.5)
Eosinophils Relative: 1 %
HCT: 33.5 % — ABNORMAL LOW (ref 36.0–46.0)
Hemoglobin: 11 g/dL — ABNORMAL LOW (ref 12.0–15.0)
Immature Granulocytes: 2 %
Lymphocytes Relative: 9 %
Lymphs Abs: 1.2 10*3/uL (ref 0.7–4.0)
MCH: 32.6 pg (ref 26.0–34.0)
MCHC: 32.8 g/dL (ref 30.0–36.0)
MCV: 99.4 fL (ref 80.0–100.0)
Monocytes Absolute: 1.1 10*3/uL — ABNORMAL HIGH (ref 0.1–1.0)
Monocytes Relative: 8 %
Neutro Abs: 10.9 10*3/uL — ABNORMAL HIGH (ref 1.7–7.7)
Neutrophils Relative %: 80 %
Platelets: 210 10*3/uL (ref 150–400)
RBC: 3.37 MIL/uL — ABNORMAL LOW (ref 3.87–5.11)
RDW: 15.6 % — ABNORMAL HIGH (ref 11.5–15.5)
WBC: 13.6 10*3/uL — ABNORMAL HIGH (ref 4.0–10.5)
nRBC: 0 % (ref 0.0–0.2)

## 2019-05-23 LAB — COMPREHENSIVE METABOLIC PANEL
ALT: 26 U/L (ref 0–44)
AST: 19 U/L (ref 15–41)
Albumin: 3.5 g/dL (ref 3.5–5.0)
Alkaline Phosphatase: 43 U/L (ref 38–126)
Anion gap: 9 (ref 5–15)
BUN: 34 mg/dL — ABNORMAL HIGH (ref 6–20)
CO2: 25 mmol/L (ref 22–32)
Calcium: 8.6 mg/dL — ABNORMAL LOW (ref 8.9–10.3)
Chloride: 101 mmol/L (ref 98–111)
Creatinine, Ser: 1.03 mg/dL — ABNORMAL HIGH (ref 0.44–1.00)
GFR calc Af Amer: >60 mL/min (ref >60)
GFR calc non Af Amer: >60 mL/min (ref >60)
Glucose, Bld: 114 mg/dL — ABNORMAL HIGH (ref 70–99)
Potassium: 3.4 mmol/L — ABNORMAL LOW (ref 3.5–5.1)
Sodium: 135 mmol/L (ref 135–145)
Total Bilirubin: 1.1 mg/dL (ref 0.3–1.2)
Total Protein: 5.8 g/dL — ABNORMAL LOW (ref 6.5–8.1)

## 2019-05-23 LAB — D-DIMER, QUANTITATIVE: D-Dimer, Quant: 0.32 ug/mL-FEU (ref 0.00–0.50)

## 2019-05-23 NOTE — Telephone Encounter (Signed)
According to note she is not on oxygen .  If her sats are dropping this low with office closed will need to go to ER for evaluation   Could go back up to Prednisone 60mg  daily until Monday and call back for OV but worry that something else is going on.  REC : ER evaluation   Please contact office for sooner follow up if symptoms do not improve or worsen or seek emergency care

## 2019-05-23 NOTE — Telephone Encounter (Signed)
Spoke with the pt  She is c/o increased SOB and "exhaustion" over the past wk  Her sats have been in the 80's- 84-91% is what she has been running all wk long  She states that she has just decreased her pred down from 60 mg to 50 mg  She has been taking nebs 3 x per day and makes her heart rate elevated and she feels shaky  She states had rituxan infusion yesterday  No fever, chills, aches, cough  Please advise thanks

## 2019-05-23 NOTE — Discharge Instructions (Addendum)
Recommend follow-up with your primary care doctor, primary rheumatologist, primary pulmonologist.  If your oxygen stays in 80s, you feel more short of breath, have any chest pain, please return to ER for reassessment.  Continue current regimen of steroids.

## 2019-05-23 NOTE — Telephone Encounter (Signed)
I spoke with the pt and notified of recs per [facility] and she verbalized understanding  Will go to ED  Will forward to Dr Celine Mans to keep her informed  Also to let her know that the advair is the preferred inhaler on her formulary

## 2019-05-23 NOTE — ED Provider Notes (Signed)
[facility] EMERGENCY DEPARTMENT Provider Note   CSN: [id number] Arrival date & time: 05/23/19  [place]     History Chief Complaint  Patient presents with  . Shortness of Breath    Diane Wells is a 39 y.o. female.  Presenting to emergency department with chief complaint shortness of breath.  Patient diagnosed with her stress disease, followed closely by rheumatology, nephrology, pulmonology.  Reports being on chronic steroids, 60 mg prednisone daily, 2 weeks ago went to 50 mg daily.  Yesterday started rituximab infusion.  Over the past couple months she has noted some worsening shortness of breath.  And most of her office appointments recently her oxygen saturation has been 90 to 94%.  She started monitoring her oxygen more closely and noted that dropped into 80s today.  Says that her breathing has not significantly changed today.  Specifically she denies any chest pain.  Shortness of breath is worse with exertion.  No cough, no fever.  HPI     Past Medical History:  Diagnosis Date  . Allergic rhinitis   . Asthma   . Chronic kidney disease   . 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 Hypertension   . Recurrent upper respiratory infection (URI)     Patient Active Problem List   Diagnosis Date Noted  . Patellar dislocation 01/17/2018  . Acute left ankle pain 11/27/2017  . Allergic reaction 02/15/2016  . Moderate persistent asthma 02/15/2016  . Perennial and seasonal allergic rhinitis 02/15/2016  . Allergic conjunctivitis 02/15/2016  . Obesity, unspecified obesity severity, unspecified obesity type 01/10/2016    Past Surgical History:  Procedure Laterality Date  . ADENOIDECTOMY    . ANTERIOR CERVICAL DECOMP/DISCECTOMY FUSION       OB History   No obstetric history on file.     Family History  Problem Relation Age of Onset  . Diabetes Mother        Type 1 Diabetes, juvenile onset  . Hypertension Mother   . Pulmonary Hypertension Mother   .  Hyperthyroidism Mother   . Glaucoma Mother   . Neuropathy Mother   . Sjogren's syndrome Mother   . Carpal tunnel syndrome Mother   . Hypertension Father   . Gallbladder disease Father   . Hypertension Maternal Grandmother   . Glaucoma Maternal Grandmother   . Coronary artery disease Maternal Grandmother   . Heart attack Maternal Grandmother   . Angina Maternal Grandmother   . Macular degeneration Maternal Grandmother   . COPD Maternal Grandmother   . Congestive Heart Failure Maternal Grandmother   . Leukemia Paternal Grandmother   . Hypertension Paternal Grandmother   . Colon cancer Paternal Grandfather   . Diabetes Maternal Uncle   . Heart attack Maternal Uncle   . Stroke Maternal Uncle   . Glaucoma Maternal Grandfather   . Allergic rhinitis Neg Hx   . Angioedema Neg Hx   . Asthma Neg Hx   . Eczema Neg Hx     Social History   Tobacco Use  . Smoking status: Never Smoker  . Smokeless tobacco: Never Used  Substance Use Topics  . Alcohol use: No  . Drug use: No    Home Medications Prior to Admission medications   Medication Sig Start Date End Date Taking? Authorizing Provider  albuterol (PROVENTIL) (2.5 MG/3ML) 0.083% nebulizer solution Take 3 mLs (2.5 mg total) by nebulization every 6 (six) hours as needed for wheezing or shortness of breath. 05/13/19   Spero Geralds, MD  butalbital-acetaminophen-caffeine (FIORICET) 50-325-40 MG tablet TAKE 1-2 TABLETS BY MOUTH EVERY 6 (SIX) HOURS AS NEEDED FOR HEADACHE. MAX 6 PER DAY 02/19/19 02/19/20  Copland, Gwenlyn Found, MD  dapsone 100 MG tablet Take 100 mg by mouth daily.    [provider]  gabapentin (NEURONTIN) 300 MG capsule Take 1 capsule (300 mg total) by mouth 3 (three) times daily. 05/16/18   Copland, Gwenlyn Found, MD  LORazepam (ATIVAN) 0.5 MG tablet Take 1-2 tablets (0.5-1 mg total) by mouth at bedtime. Use as needed for short term insomnia 04/16/19   Copland, Gwenlyn Found, MD  losartan (COZAAR) 25 MG tablet  04/09/19    [provider]  methocarbamol (ROBAXIN) 750 MG tablet Take 1 tablet (750 mg total) by mouth every 8 (eight) hours as needed for muscle spasms. 06/10/18   Copland, Gwenlyn Found, MD  prednisoLONE acetate (PRED FORTE) 1 % ophthalmic suspension 1 drop 3 (three) times daily. 04/21/19   [provider]  predniSONE (DELTASONE) 20 MG tablet 2 TABLET IN AM AND 1 TABLET IN THE EVENING ORALLY 30 DAY(S) 04/15/19   [provider]  PROAIR HFA 108 (90 Base) MCG/ACT inhaler Inhale 1 puff into the lungs daily as needed. 05/16/18   Copland, Gwenlyn Found, MD    Allergies    Other, Penicillins, and Sulfa antibiotics  Review of Systems   Review of Systems  Constitutional: Negative for chills and fever.  HENT: Negative for ear pain and sore throat.   Eyes: Negative for pain and visual disturbance.  Respiratory: Positive for shortness of breath. Negative for cough.   Cardiovascular: Negative for chest pain and palpitations.  Gastrointestinal: Negative for abdominal pain and vomiting.  Genitourinary: Negative for dysuria and hematuria.  Musculoskeletal: Negative for arthralgias and back pain.  Skin: Negative for color change and rash.  Neurological: Negative for seizures and syncope.  All other systems reviewed and are negative.   Physical Exam Updated Vital Signs BP 119/78   Pulse 95   Temp 97.8 F (36.6 C) (Oral)   Resp (!) 23   Ht 5\' 5"  (1.651 m)   Wt 117.9 kg   LMP 05/12/2019 (Exact Date)   SpO2 93%   BMI 43.27 kg/m   Physical Exam Vitals and nursing note reviewed.  Constitutional:      General: She is not in acute distress.    Appearance: She is well-developed.  HENT:     Head: Normocephalic and atraumatic.  Eyes:     Conjunctiva/sclera: Conjunctivae normal.  Cardiovascular:     Rate and Rhythm: Normal rate and regular rhythm.     Heart sounds: No murmur.  Pulmonary:     Effort: Pulmonary effort is normal. No respiratory distress.     Breath sounds: Normal breath  sounds.  Chest:     Chest wall: No deformity or tenderness.  Abdominal:     Palpations: Abdomen is soft.     Tenderness: There is no abdominal tenderness.  Musculoskeletal:     Cervical back: Neck supple.  Skin:    General: Skin is warm and dry.     Capillary Refill: Capillary refill takes less than 2 seconds.  Neurological:     General: No focal deficit present.     Mental Status: She is alert and oriented to person, place, and time.     ED Results / Procedures / Treatments   Labs (all labs ordered are listed, but only abnormal results are displayed) Labs Reviewed  CBC WITH DIFFERENTIAL/PLATELET - Abnormal; Notable  for the following components:      Result Value   WBC 13.6 (*)    RBC 3.37 (*)    Hemoglobin 11.0 (*)    HCT 33.5 (*)    RDW 15.6 (*)    Neutro Abs 10.9 (*)    Monocytes Absolute 1.1 (*)    Abs Immature Granulocytes 0.31 (*)    All other components within normal limits  COMPREHENSIVE METABOLIC PANEL - Abnormal; Notable for the following components:   Potassium 3.4 (*)    Glucose, Bld 114 (*)    BUN 34 (*)    Creatinine, Ser 1.03 (*)    Calcium 8.6 (*)    Total Protein 5.8 (*)    All other components within normal limits  D-DIMER, QUANTITATIVE (NOT AT [facility])  URINALYSIS, ROUTINE W REFLEX MICROSCOPIC  I-STAT BETA HCG BLOOD, ED ([facility], [facility], [facility] ONLY)    EKG EKG Interpretation  Date/Time:  Friday May 23 2019 19:57:58 EST Ventricular Rate:  100 PR Interval:    QRS Duration: 89 QT Interval:  318 QTC Calculation: 411 R Axis:   19 Text Interpretation: Sinus tachycardia Confirmed by Marianna Fuss ([id number]) on 05/23/2019 9:20:09 PM   Radiology DG Chest 2 View  Result Date: 05/23/2019 CLINICAL DATA:  Short of breath EXAM: CHEST - 2 VIEW COMPARISON:  None. FINDINGS: The heart size and mediastinal contours are within normal limits. Both lungs are clear. Surgical hardware in the cervical spine. IMPRESSION: No active cardiopulmonary disease. Electronically  Signed   By: Jasmine Pang M.D.   On: 05/23/2019 20:38    Procedures Procedures (including critical care time)  Medications Ordered in ED Medications - No data to display  ED Course  I have reviewed the triage vital signs and the nursing notes.  Pertinent labs & imaging results that were available during my care of the patient were reviewed by me and considered in my medical decision making (see chart for details).  Clinical Course as of May 22 2240  Fri May 23, 2019  2215 Reviewed with Warrick Parisian   [RD]    Clinical Course User Index [RD] Milagros Loll, MD   MDM Rules/Calculators/A&P                      39 year old lady medical history EPGA presented to ER with shortness of breath.  On exam, patient noted to have borderline oxygen saturations 92 to 94% while in the department, well-appearing otherwise, no respiratory distress, no accessory muscle use.  CXR negative.  Dimer within normal limits.  Suspect her shortness of breath and hypoxia related to her underlying vasculitis and this is related to chronic process and not an acute change.  Reviewed case in detail with on-call pulmonology, Dr. Warrick Parisian who recommends close follow-up with her regular doctors.  He does not recommend making any changes in her steroid regimen.  Reviewed findings with patient who remains well-appearing without any increased respiratory difficulty.  Discussed need for close outpatient follow-up, discussed return precautions, discharged home.    After the discussed management above, the patient was determined to be safe for discharge.  The patient was in agreement with this plan and all questions regarding their care were answered.  ED return precautions were discussed and the patient will return to the ED with any significant worsening of condition.   Final Clinical Impression(s) / ED Diagnoses Final diagnoses:  Churg-Strauss syndrome (HCC)  Dyspnea, unspecified type    Rx / DC Orders ED Discharge Orders  None       Milagros Loll, MD 05/23/19 2246

## 2019-05-23 NOTE — ED Triage Notes (Addendum)
Pt c/o SOB x 1week-states her sats have been in the 80 today-pt with extensive workup in process r/t to new autoimmune disease dx-pt states she is anxious- with steady conversation-no resp distress noted at this time-steady gait with own cane

## 2019-05-26 ENCOUNTER — Other Ambulatory Visit: Payer: BC Managed Care – PPO

## 2019-05-26 ENCOUNTER — Telehealth: Payer: Self-pay | Admitting: Internal Medicine

## 2019-05-26 NOTE — Progress Notes (Signed)
_0  ID: Diane Wells, female    DOB: 1980-06-20, 39 y.o.   MRN: [id number]  Chief Complaint  Patient presents with  . Acute Visit    Patient is here for shortness of breath with exertion and sats dropping. Patient got an infusion of Truxima for Granulomatosis w/ polyangiitis last Thursday and states that sats never got 89%. Patient is having complications after doing neb treatments.     Referring provider: Copland, Diane Filler, MD  HPI:  39 year old female never smoker followed in our office for clinical history of asthma.  Patient also recently diagnosed with granulomatosis with polyangiitis.  PMH: Obesity Smoker/ Smoking History: Never smoker Maintenance: 50 mg of prednisone (managed by rheumatology) Pt of: Dr. Shearon Wells  Rheumatology - Dr. Trudie Wells at [facility] Dr. Rexene Wells in Neurology Dr. 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 Johnney Wells. Biopsy deferred due to presumptive diagnosis ultrasound findings, and proteinuria and hematuria.  Currently on 44m prednisone taper - tomorrow goes to 50 mg. On dapsone.  Denied Rituximab due to insurance issues - starting something biosimilar next Thursday.  Ongoing symptoms: neuropathy, lower extremity arthralgias, sinusitis.    05/27/2019  - Visit   39year old female never smoker presenting to office today as an acute visit.  Patient contacted our office reporting that she had had increased shortness of breath, fatigue as well as hypoxic episodes after using her albuterol nebulizer.  She does report some intermittent tachycardia after using her albuterol nebulizer.  Walk today in office stable with no oxygen desaturations.  Last week patient was given her first infusion of a bio similar Truxima.  Patient was sent to the emergency room if she had increased shortness of breath, potential anaphylactic reaction.  This is been managed by rheumatology  Dr. HTrudie Wells  Patient is scheduled for another infusion on Thursday with additional premeds.  Patient is currently on 50 mg prednisone daily as managed by rheumatology.  Patient is contacted her insurance company and they report that Advair may be the most affordable for the patient.  She is still unsure of what the cost would be.  We will have pharmacy investigate this today.  Patient is scheduled for pulmonary function test but this does not need to be completed until late April/2021.  Patient is scheduled to complete an echocardiogram next week.  Note from 05/13/2019 with Dr. DShearon Stallsfeatures these lab results: Anti- MPO negative PR-3 elevated to 6.6 (0-3.5 nl) C-ANCA 1:40 (1:20 normal) P-ANCA not elevated +RF Elevated ESR   Questionaires / Pulmonary Flowsheets:   ACT:  Asthma Control Test ACT Total Score  05/13/2019 18  02/15/2016 12    Tests:   FENO:  No results found for: NITRICOXIDE  PFT: No flowsheet data found.  WALK:  SIX MIN WALK 05/27/2019  Supplimental Oxygen during Test? (L/min) No  Tech Comments: Patient walked slow pace and maintained good O2 sats.    Imaging: DG Chest 2 View  Result Date: 05/23/2019 CLINICAL DATA:  Short of breath EXAM: CHEST - 2 VIEW COMPARISON:  None. FINDINGS: The heart size and mediastinal contours are within normal limits. Both lungs are clear. Surgical hardware in the cervical spine. IMPRESSION: No active cardiopulmonary disease. Electronically Signed   By: KDonavan FoilM.D.   On: 05/23/2019 20:38   DG Bone Density  Result Date: 05/19/2019 EXAM: DUAL X-RAY ABSORPTIOMETRY (DXA) FOR BONE MINERAL DENSITY IMPRESSION: Diane Wells, Your patient CLeatta Alewinecompleted a BMD test  on 05/19/2019 using the [place] (analysis version: 16.SP2) manufactured by [employer]. The following summarizes the results of our evaluation. [place] PATIENT: Name: Diane, Wells Patient ID: [id number] Birth Date: 1980/06/16 Height: 65.5 in.  Gender: Female Measured: 05/19/2019 Weight: 257.8 lbs. Indications: Caucasian, Low Calcium Intake, Pre Menopausal, Rheumatoid Arthritis Fractures: Treatments: Gabapentin(seizure medication) ASSESSMENT: The BMD measured at Femur Neck Right is 0.940 g/cm2 with a Z-score of -0.3. The Z-score is within the expected range for age. The ISCD recommends using Z-scores for assessment of pre-menopausal women, men between 35 and 7 yoa, and children under 23 yoa. The diagnosis of osteoporosis in these patients should not be made on the basis of densitometric criteria alone. Site      Region     Measured Date Measured Age AM      BMD Z-score DualFemur Neck Right 05/19/2019    38.3         -0.3    0.940 g/cm2 [facility] Spine  L2-L4      05/19/2019    38.3         0.8     1.295 g/cm2 RECOMMENDATION:All patients should ensure an adequate intake of dietary calcium (1200 mg/d) and vitamin D (800 IU daily) unless contraindicated. FOLLOW-UP: Patients with diagnosis of osteoporosis or at high risk for fracture should have regular bone mineral density tests. For patients eligible for Medicare, routine testing is allowed once every 2 years. The testing frequency can be increased to one year for patients who have rapidly progressing disease, those who are receiving or discontinuing medical therapy to restore bone mass, or have additional risk factors. I have reviewed this report, and agree with the above findings. [facility] Radiology Electronically Signed   By: Diane Wells M.D.   On: 05/19/2019 15:15    Lab Results:  CBC    Component Value Date/Time   WBC 13.6 (H) 05/23/2019 2010   RBC 3.37 (L) 05/23/2019 2010   HGB 11.0 (L) 05/23/2019 2010   HCT 33.5 (L) 05/23/2019 2010   PLT 210 05/23/2019 2010   MCV 99.4 05/23/2019 2010   MCH 32.6 05/23/2019 2010   MCHC 32.8 05/23/2019 2010   RDW 15.6 (H) 05/23/2019 2010   LYMPHSABS 1.2 05/23/2019 2010   MONOABS 1.1 (H) 05/23/2019 2010   EOSABS 0.1 05/23/2019 2010   BASOSABS 0.0  05/23/2019 2010    BMET    Component Value Date/Time   NA 135 05/23/2019 2010   K 3.4 (L) 05/23/2019 2010   CL 101 05/23/2019 2010   CO2 25 05/23/2019 2010   GLUCOSE 114 (H) 05/23/2019 2010   BUN 34 (H) 05/23/2019 2010   CREATININE 1.03 (H) 05/23/2019 2010   CALCIUM 8.6 (L) 05/23/2019 2010   GFRNONAA >60 05/23/2019 2010   GFRAA >60 05/23/2019 2010    BNP No results found for: BNP  ProBNP No results found for: PROBNP  Specialty Problems      Pulmonary Problems   Moderate persistent asthma without complication   Perennial and seasonal allergic rhinitis      Allergies  Allergen Reactions  . Other Anaphylaxis    Essential Oil, scent unknown, caused tongue swelling  . Truxima [Rituximab] Anaphylaxis  . Penicillins Hives  . Sulfa Antibiotics     Immunization History  Administered Date(s) Administered  . Influenza,inj,Quad PF,6+ Mos 01/10/2016, 05/16/2018, 01/28/2019  . Td 02/24/2016  . Tdap 03/27/2005    Past Medical History:  Diagnosis Date  . Allergic rhinitis   .  Asthma   . Chronic kidney disease   . 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 Hypertension   . Recurrent upper respiratory infection (URI)     Tobacco History: Social History   Tobacco Use  Smoking Status Never Smoker  Smokeless Tobacco Never Used   Counseling given: Yes   Continue to not smoke  Outpatient Encounter Medications as of 05/27/2019  Medication Sig  . albuterol (PROVENTIL) (2.5 MG/3ML) 0.083% nebulizer solution Take 3 mLs (2.5 mg total) by nebulization every 6 (six) hours as needed for wheezing or shortness of breath.  . dapsone 100 MG tablet Take 100 mg by mouth daily.  Marland Kitchen gabapentin (NEURONTIN) 300 MG capsule Take 1 capsule (300 mg total) by mouth 3 (three) times daily.  Marland Kitchen LORazepam (ATIVAN) 0.5 MG tablet Take 1-2 tablets (0.5-1 mg total) by mouth at bedtime. Use as needed for short term insomnia  . losartan (COZAAR) 25 MG tablet   . methocarbamol (ROBAXIN) 750 MG tablet  Take 1 tablet (750 mg total) by mouth every 8 (eight) hours as needed for muscle spasms.  . prednisoLONE acetate (PRED FORTE) 1 % ophthalmic suspension 1 drop 3 (three) times daily.  . predniSONE (DELTASONE) 20 MG tablet 2 TABLET IN AM AND 1 TABLET IN THE EVENING ORALLY 30 DAY(S)  . butalbital-acetaminophen-caffeine (FIORICET) 50-325-40 MG tablet TAKE 1-2 TABLETS BY MOUTH EVERY 6 (SIX) HOURS AS NEEDED FOR HEADACHE. MAX 6 PER DAY (Patient not taking: Reported on 05/27/2019)  . fluticasone furoate-vilanterol (BREO ELLIPTA) 100-25 MCG/INH AEPB Inhale 1 puff into the lungs daily.  . mometasone-formoterol (DULERA) 100-5 MCG/ACT AERO Inhale 2 puffs into the lungs every 12 (twelve) hours. 2 puffs in the morning right when you wake up, rinse out your mouth after use, 12 hours later 2 puffs, rinse after use  . PROAIR HFA 108 (90 Base) MCG/ACT inhaler Inhale 1 puff into the lungs daily as needed. (Patient not taking: Reported on 05/27/2019)   No facility-administered encounter medications on file as of 05/27/2019.     Review of Systems  Review of Systems  Constitutional: Positive for fatigue. Negative for activity change and fever.  HENT: Negative for sinus pressure, sinus pain and sore throat.   Respiratory: Positive for shortness of breath and wheezing. Negative for cough.   Cardiovascular: Positive for palpitations. Negative for chest pain.  Gastrointestinal: Negative for diarrhea, nausea and vomiting.  Musculoskeletal: Negative for arthralgias.  Neurological: Negative for dizziness.  Psychiatric/Behavioral: Negative for sleep disturbance. The patient is not nervous/anxious.      Physical Exam  BP 138/90 (BP Location: Left Arm, Patient Position: Sitting, Cuff Size: Normal)   Pulse (!) 105   Temp 98 F (36.7 C) (Temporal)   Ht _0  (1.676 m)   Wt 259 lb 9.6 oz (117.8 kg)   LMP 05/12/2019 (Exact Date)   SpO2 96%   BMI 41.90 kg/m   Wt Readings from Last 5 Encounters:  05/27/19 259 lb 9.6 oz  (117.8 kg)  05/23/19 260 lb (117.9 kg)  05/19/19 258 lb (117 kg)  05/13/19 258 lb 6.4 oz (117.2 kg)  05/07/19 253 lb 5 oz (114.9 kg)    BMI Readings from Last 5 Encounters:  05/27/19 41.90 kg/m  05/23/19 43.27 kg/m  05/19/19 41.64 kg/m  05/13/19 41.71 kg/m  05/07/19 40.89 kg/m     Physical Exam Vitals and nursing note reviewed.  Constitutional:      General: She is not in acute distress.    Appearance: Normal appearance. She is  obese.  HENT:     Head: Normocephalic and atraumatic.     Right Ear: Tympanic membrane, ear canal and external ear normal. There is no impacted cerumen.     Left Ear: Tympanic membrane, ear canal and external ear normal. There is no impacted cerumen.     Nose:     Comments: Deferred due to masking requirement    Mouth/Throat:     Comments: Deferred due to mask requirement Eyes:     Pupils: Pupils are equal, round, and reactive to light.  Cardiovascular:     Rate and Rhythm: Normal rate and regular rhythm.     Pulses: Normal pulses.     Heart sounds: Normal heart sounds. No murmur.  Pulmonary:     Effort: Pulmonary effort is normal. No respiratory distress.     Breath sounds: No decreased air movement. No decreased breath sounds, wheezing or rales.  Musculoskeletal:     Cervical back: Normal range of motion.  Skin:    General: Skin is warm and dry.     Capillary Refill: Capillary refill takes less than 2 seconds.  Neurological:     General: No focal deficit present.     Mental Status: She is alert and oriented to person, place, and time. Mental status is at baseline.     Gait: Gait abnormal (Tolerated walk in office today, typically walks with cane due to arthralgias).  Psychiatric:        Mood and Affect: Mood normal.        Behavior: Behavior normal.        Thought Content: Thought content normal.        Judgment: Judgment normal.       Assessment & Plan:   Granulomatosis with polyangiitis (HCC) Last week tried truxima a bio  similar rituximab, had a suspected allergic reaction Schedule to try again this Thursday with different premedications This she is managed by Dr. Trudie Wells with [facility] rheumatology Currently on 50 mg of prednisone daily Not on any maintenance inhalers at this time, insurance believes that Advair would be the most cost effective for the patient, they did not tell her what the cost would be Having acute symptoms when using her albuterol nebulized meds She has not used albuterol today and shortness of breath is improved She does not have the symptoms as severe when using her rescue inhaler She did not have the symptoms with Flovent Walk today in office patient did not have any oxygen desaturations Lungs clear to auscultation today  Plan: Trial of Breo Ellipta 100 Referral to clinical pharmacy team to help with maintenance inhaler cost investigation >>> Addendum: Most affordable option for the patient will be Dulera 100 with a co-pay card, prescription sent to pharmacy Continue 50 mg of prednisone as managed by [facility] rheumatology Personally coordinated pulmonary function test to be scheduled on 06/03/2019 Complete echocardiogram as ordered next week Personally coordinated follow-up with our office with Dr. Shearon Wells on 06/11/2019    Moderate persistent asthma without complication Previously managed on Flovent, stopped due to high cost  I believe likely patient's symptoms are granulomatosis  Plan: Trial of Breo Ellipta 100 Pulmonary function testing rescheduled for 06/03/2019  Medication management Spoke with clinical pharmacy team tech Apolonio Schneiders to do test claims for patient  Plan: Clinical pharmacy team will follow up with patient later on today after doing test claims for ICS/LABA coverage  Addendum: Most affordable option for the patient will be Dulera 100 with co-pay card, patient notified, prescription  sent to pharmacy    Return in about 15 days (around 06/11/2019), or if  symptoms worsen or fail to improve, for Follow up with Dr. Shearon Wells, Follow up for PFT.   Lauraine Rinne, NP 05/27/2019   This appointment required 45 minutes of patient care (this includes precharting, chart review, review of results, face-to-face care, etc.).

## 2019-05-26 NOTE — Telephone Encounter (Signed)
Called and spoke with Patient.  Tammy, NP's recommendations given.  Dr. Celine Mans will not be in office until 05/30/19.  Patient requested 05/27/19 OV at 0930, if possible.  Patient scheduled with Arlys John, NP at requested time.  Nothing further at this time.

## 2019-05-26 NOTE — Telephone Encounter (Signed)
Very complicated case needs ov to address multiple issue . Can set up with APP if Dr. Celine Mans is not available    Please contact office for sooner follow up if symptoms do not improve or worsen or seek emergency care

## 2019-05-26 NOTE — Telephone Encounter (Signed)
Called and spoke with Patient.  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  Patient stated she was stable at last OV, and thought most issues were coming from anxiety, about upcoming infusion. 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 Patient stated she has had decreased O2 after using albuterol nebs.  Patient stated her O2 has been 88-90 on RA, but up to 30 minutes after nebs, sats are mid 80's.  Patient stated she was seen in ED 05/23/19, because sats were low to mid 80's, and she was having fatigue. Patient stated during ED visit, sats stayed around 90% on RA. Patient stated she has not been on O2, tried to qualify for O2, and feels no distress when her sats drop in mid to high 80's.  Patient stated she rest, and sats return to 89-90%.  Patient stated best average sats 90-93%. Patient stated she discussed inhalers with Dr. Celine Mans, and was wanting to know if Advair could help. Offered Patient OV with NP, but Patient wanted advice, and recommendations first.  Patient stated she may need  possible increase of prednisone. Patient stated she could come in for OV if needed, tomorrow.  Message routed to [facility], NP to advise

## 2019-05-26 NOTE — Telephone Encounter (Signed)
Continued...  She is not sure is the nebulizers is the cause of the problems or it is helping the problems. Once she does the nebulizer treatments is when her O2 drops but once she sits still it goes up to upper 80's and with movement it drops to the mid 80's.

## 2019-05-27 ENCOUNTER — Ambulatory Visit: Payer: BC Managed Care – PPO | Admitting: Pulmonary Disease

## 2019-05-27 ENCOUNTER — Ambulatory Visit: Payer: BC Managed Care – PPO | Admitting: Psychology

## 2019-05-27 ENCOUNTER — Other Ambulatory Visit: Payer: Self-pay

## 2019-05-27 ENCOUNTER — Encounter: Payer: Self-pay | Admitting: Pulmonary Disease

## 2019-05-27 ENCOUNTER — Telehealth: Payer: Self-pay | Admitting: Pharmacy Technician

## 2019-05-27 VITALS — BP 138/90 | HR 105 | Temp 98.0°F | Ht 66.0 in | Wt 259.6 lb

## 2019-05-27 DIAGNOSIS — Z79899 Other long term (current) drug therapy: Secondary | ICD-10-CM | POA: Diagnosis not present

## 2019-05-27 DIAGNOSIS — M3130 Wegener's granulomatosis without renal involvement: Secondary | ICD-10-CM

## 2019-05-27 DIAGNOSIS — J4540 Moderate persistent asthma, uncomplicated: Secondary | ICD-10-CM

## 2019-05-27 MED ORDER — BREO ELLIPTA 100-25 MCG/INH IN AEPB: 1.0000 | INHALATION_SPRAY | Freq: Every day | RESPIRATORY_TRACT | 0 refills | Status: DC

## 2019-05-27 MED ORDER — MOMETASONE FURO-FORMOTEROL FUM 100-5 MCG/ACT IN AERO: 2.0000 | INHALATION_SPRAY | Freq: Two times a day (BID) | RESPIRATORY_TRACT | 3 refills | Status: DC

## 2019-05-27 NOTE — Assessment & Plan Note (Addendum)
Spoke with clinical pharmacy team tech Fleet Contras to do test claims for patient  Plan: Clinical pharmacy team will follow up with patient later on today after doing test claims for ICS/LABA coverage  Addendum: Most affordable option for the patient will be Dulera 100 with co-pay card, patient notified, prescription sent to pharmacy

## 2019-05-27 NOTE — Patient Instructions (Addendum)
You were seen today by Coral Ceo, NP  for:   It was a pleasure meeting you.  Thank you for your patience today.  I agree go ahead and stop your albuterol nebulized meds at this time.  We will give you a trial/sample of Breo Ellipta 100.  Our pharmacy team will follow up with you later on today regarding coverage of other maintenance inhalers that may be more affordable for you.  Continue forward with your work-up with rheumatology.  We rescheduled your pulmonary function testing and scheduled a closer follow-up with our office as discussed today.  Please do not hesitate to give [place] a call if you have any questions or concerns.  Arlys John   1. Granulomatosis with polyangiitis, unspecified whether renal involvement (HCC) 2. Moderate persistent asthma without complication 3. Medication management  Trial of Breo Ellipta 100 >>> Take 1 puff daily in the morning right when you wake up >>>Rinse your mouth out after use >>>This is a daily maintenance inhaler, NOT a rescue inhaler >>>Contact our office if you are having difficulties affording or obtaining this medication >>>It is important for you to be able to take this daily and not miss any doses  Only use your albuterol as a rescue medication to be used if you can't catch your breath by resting or doing a relaxed purse lip breathing pattern.  - The less you use it, the better it will work when you need it. - Ok to use up to 2 puffs  every 4 hours if you must but call for immediate appointment if use goes up over your usual need - Don't leave home without it !!  (think of it like the spare tire for your car)   Continue prednisone as outlined by rheumatology  Continue forward with second attempt on bio similar as managed by rheumatology    We recommend today:   Meds ordered this encounter  Medications  . fluticasone furoate-vilanterol (BREO ELLIPTA) 100-25 MCG/INH AEPB    Sig: Inhale 1 puff into the lungs daily.    Dispense:  28 each   Refill:  0    Order Specific Question:   Lot Number?    Answer:   [id number]    Order Specific Question:   Expiration Date?    Answer:   06/25/2019    Order Specific Question:   Manufacturer?    Answer:   [employer] [[id number]]    Order Specific Question:   Quantity    Answer:   1    Follow Up:    Return in about 15 days (around 06/11/2019), or if symptoms worsen or fail to improve, for Follow up with Dr. Celine Mans, Follow up for PFT.   Please do your part to reduce the spread of COVID-19:      Reduce your risk of any infection  and COVID19 by using the similar precautions used for avoiding the common cold or flu:  Marland Kitchen Wash your hands often with soap and warm water for at least 20 seconds.  If soap and water are not readily available, use an alcohol-based hand sanitizer with at least 60% alcohol.  . If coughing or sneezing, cover your mouth and nose by coughing or sneezing into the elbow areas of your shirt or coat, into a tissue or into your sleeve (not your hands). Drinda Butts A MASK when in public  . Avoid shaking hands with others and consider head nods or verbal greetings only. . Avoid touching your eyes, nose, or  mouth with unwashed hands.  . Avoid close contact with people who are sick. . Avoid places or events with large numbers of people in one location, like concerts or sporting events. . If you have some symptoms but not all symptoms, continue to monitor at home and seek medical attention if your symptoms worsen. . If you are having a medical emergency, call 911.   [place] / e-Visit: [email or web address]         [facility]: [facility]: [phone]                   [facility]: [phone]     It is flu season:   >>> Best ways to protect herself from the flu: Receive the yearly flu vaccine, practice good hand hygiene washing  with soap and also using hand sanitizer when available, eat a nutritious meals, get adequate rest, hydrate appropriately   Please contact the office if your symptoms worsen or you have concerns that you are not improving.   Thank you for choosing [facility] for your healthcare, and for allowing [place] to partner with you on your healthcare journey. I am thankful to be able to provide care to you today.   Wyn Quaker FNP-C

## 2019-05-27 NOTE — Telephone Encounter (Signed)
If patient can do Dulera and use a co-pay card to get the cost down lower than a $40 a month co-pay that would be preferential.  Am I understanding correctly that her co-pay for Elwin Sleight would be $40 a month and she could use a co-pay card to cover that $40 to essentially be paying $0 out-of-pocket for [place]?  Elisha Headland, FNP

## 2019-05-27 NOTE — Telephone Encounter (Signed)
Yes, that is correct 

## 2019-05-27 NOTE — Telephone Encounter (Signed)
Can we send a script to:   Dulera 100 >>> 2 puffs in the morning right when you wake up, rinse out your mouth after use, 12 hours later 2 puffs, rinse after use >>> Take this daily, no matter what >>> This is not a rescue inhaler   To pts preferred pharmacy listed above.   Elisha Headland FNP

## 2019-05-27 NOTE — Telephone Encounter (Signed)
Ok, sounds good. I called patient to advise and provided copay card info. [facility] to [facility] [address]. I advised patient to [place] know if she needs any other assistance.  Thanks! Dorthula Nettles, CPhT

## 2019-05-27 NOTE — Telephone Encounter (Signed)
I would prefer Dulera then for the patient as that would be the most cost effective   Elisha Headland FNP

## 2019-05-27 NOTE — Telephone Encounter (Signed)
Medication has been sent to pharmacy.  °

## 2019-05-27 NOTE — Assessment & Plan Note (Addendum)
Last week tried truxima a bio similar rituximab, had a suspected allergic reaction Schedule to try again this Thursday with different premedications This she is managed by Dr. Nickola Major with [facility] rheumatology Currently on 50 mg of prednisone daily Not on any maintenance inhalers at this time, insurance believes that Advair would be the most cost effective for the patient, they did not tell her what the cost would be Having acute symptoms when using her albuterol nebulized meds She has not used albuterol today and shortness of breath is improved She does not have the symptoms as severe when using her rescue inhaler She did not have the symptoms with Flovent Walk today in office patient did not have any oxygen desaturations Lungs clear to auscultation today  Plan: Trial of Breo Ellipta 100 Referral to clinical pharmacy team to help with maintenance inhaler cost investigation >>> Addendum: Most affordable option for the patient will be Dulera 100 with a co-pay card, prescription sent to pharmacy Continue 50 mg of prednisone as managed by [facility] rheumatology Personally coordinated pulmonary function test to be scheduled on 06/03/2019 Complete echocardiogram as ordered next week Personally coordinated follow-up with our office with Dr. Celine Mans on 06/11/2019

## 2019-05-27 NOTE — Assessment & Plan Note (Signed)
Previously managed on Flovent, stopped due to high cost  I believe likely patient's symptoms are granulomatosis  Plan: Trial of Breo Ellipta 100 Pulmonary function testing rescheduled for 06/03/2019

## 2019-05-27 NOTE — Telephone Encounter (Signed)
Findings of benefits investigation via test claims at [facility]:   Insurance: [place]   *Patient has a $147 pharmacy deductible remaining to be met. Called plan plan prefers Advair, Dulera, and Symbicort.   Advair HFA - # 1 inhaler for a 1 month supply through patient's insurance is $ 187.08.  Ruthe Mannan - # 1 inhaler for a 1 month supply through patient's insurance is $ 187.08.  Advair Diskus - # 1 inhaler for a 1 month supply through patient's insurance is $ 128.56.  Symbicort - # 1 inhaler for a 1 month supply through patient's insurance is $ 187.08.  Breo-  - # 1 inhaler for a 1 month supply through patient's insurance is $ 187.08.  Airduo- NDC not covered.  Patient's copays will be $25 (Advair Diskus) or $40 (All others) after deductible is patient.  There are no Asthma grants available at this time that patient would qualify for.  Ruthe Mannan does offer a copay card, that will pay up to $90/fill.  RxBIN: [id number] RxPCN: Loyalty RxGrp: [id number] ID: [id number]  Called patient to advise, she does not have an inhaler preference and says she is fine with meeting remainder of deductible and copays thereafter.  11:39 AM Beatriz Chancellor, CPhT

## 2019-05-29 DIAGNOSIS — M0579 Rheumatoid arthritis with rheumatoid factor of multiple sites without organ or systems involvement: Secondary | ICD-10-CM | POA: Diagnosis not present

## 2019-05-31 ENCOUNTER — Other Ambulatory Visit (HOSPITAL_COMMUNITY)
Admission: RE | Admit: 2019-05-31 | Discharge: 2019-05-31 | Disposition: A | Discharge status: Home / Self Care | Payer: BC Managed Care – PPO | Source: Ambulatory Visit | Attending: Internal Medicine | Admitting: Internal Medicine

## 2019-05-31 ENCOUNTER — Other Ambulatory Visit: Payer: Self-pay

## 2019-05-31 DIAGNOSIS — Z01812 Encounter for preprocedural laboratory examination: Secondary | ICD-10-CM | POA: Insufficient documentation

## 2019-05-31 DIAGNOSIS — Z20822 Contact with and (suspected) exposure to covid-19: Secondary | ICD-10-CM | POA: Diagnosis not present

## 2019-05-31 LAB — SARS CORONAVIRUS 2 (TAT 6-24 HRS): SARS Coronavirus 2: NEGATIVE

## 2019-06-02 ENCOUNTER — Ambulatory Visit (HOSPITAL_COMMUNITY): Payer: BC Managed Care – PPO | Attending: Cardiology

## 2019-06-02 ENCOUNTER — Other Ambulatory Visit: Payer: Self-pay

## 2019-06-02 DIAGNOSIS — M3130 Wegener's granulomatosis without renal involvement: Secondary | ICD-10-CM | POA: Diagnosis not present

## 2019-06-03 ENCOUNTER — Other Ambulatory Visit: Payer: Self-pay | Admitting: *Deleted

## 2019-06-03 ENCOUNTER — Ambulatory Visit (INDEPENDENT_AMBULATORY_CARE_PROVIDER_SITE_OTHER): Payer: BC Managed Care – PPO | Admitting: Internal Medicine

## 2019-06-03 DIAGNOSIS — M3130 Wegener's granulomatosis without renal involvement: Secondary | ICD-10-CM

## 2019-06-03 DIAGNOSIS — J4540 Moderate persistent asthma, uncomplicated: Secondary | ICD-10-CM

## 2019-06-03 LAB — PULMONARY FUNCTION TEST
DL/VA % pred: 95 %
DL/VA: 4.19 ml/min/mmHg/L
DLCO cor % pred: 87 %
DLCO cor: 20.57 ml/min/mmHg
DLCO unc % pred: 80 %
DLCO unc: 18.88 ml/min/mmHg
FEF 25-75 Post: 2.7 L/sec
FEF 25-75 Pre: 3.69 L/sec
FEF2575-%Change-Post: -26 %
FEF2575-%Pred-Post: 81 %
FEF2575-%Pred-Pre: 111 %
FEV1-%Change-Post: -8 %
FEV1-%Pred-Post: 92 %
FEV1-%Pred-Pre: 100 %
FEV1-Post: 3 L
FEV1-Pre: 3.27 L
FEV1FVC-%Change-Post: 4 %
FEV1FVC-%Pred-Pre: 102 %
FEV6-%Change-Post: -11 %
FEV6-%Pred-Post: 87 %
FEV6-%Pred-Pre: 99 %
FEV6-Post: 3.42 L
FEV6-Pre: 3.87 L
FEV6FVC-%Pred-Post: 101 %
FEV6FVC-%Pred-Pre: 101 %
FVC-%Change-Post: -11 %
FVC-%Pred-Post: 86 %
FVC-%Pred-Pre: 97 %
FVC-Post: 3.42 L
FVC-Pre: 3.87 L
Post FEV1/FVC ratio: 88 %
Post FEV6/FVC ratio: 100 %
Pre FEV1/FVC ratio: 84 %
Pre FEV6/FVC Ratio: 100 %
RV % pred: 86 %
RV: 1.42 L
TLC % pred: 98 %
TLC: 5.28 L

## 2019-06-03 LAB — NITRIC OXIDE: Nitric Oxide: 24

## 2019-06-03 NOTE — Progress Notes (Signed)
PFT and Feno done today.

## 2019-06-04 ENCOUNTER — Other Ambulatory Visit: Payer: Self-pay | Admitting: Family Medicine

## 2019-06-04 DIAGNOSIS — M62838 Other muscle spasm: Secondary | ICD-10-CM

## 2019-06-04 DIAGNOSIS — H5000 Unspecified esotropia: Secondary | ICD-10-CM | POA: Diagnosis not present

## 2019-06-04 DIAGNOSIS — M5090 Cervical disc disorder, unspecified, unspecified cervical region: Secondary | ICD-10-CM

## 2019-06-04 DIAGNOSIS — H15013 Anterior scleritis, bilateral: Secondary | ICD-10-CM | POA: Diagnosis not present

## 2019-06-04 DIAGNOSIS — H53042 Amblyopia suspect, left eye: Secondary | ICD-10-CM | POA: Diagnosis not present

## 2019-06-04 DIAGNOSIS — G5691 Unspecified mononeuropathy of right upper limb: Secondary | ICD-10-CM

## 2019-06-05 DIAGNOSIS — M0579 Rheumatoid arthritis with rheumatoid factor of multiple sites without organ or systems involvement: Secondary | ICD-10-CM | POA: Diagnosis not present

## 2019-06-10 ENCOUNTER — Ambulatory Visit (INDEPENDENT_AMBULATORY_CARE_PROVIDER_SITE_OTHER): Payer: BC Managed Care – PPO | Admitting: Psychology

## 2019-06-10 DIAGNOSIS — F4322 Adjustment disorder with anxiety: Secondary | ICD-10-CM | POA: Diagnosis not present

## 2019-06-11 ENCOUNTER — Encounter: Payer: Self-pay | Admitting: Neurology

## 2019-06-11 ENCOUNTER — Other Ambulatory Visit: Payer: Self-pay

## 2019-06-11 ENCOUNTER — Ambulatory Visit: Payer: BC Managed Care – PPO | Admitting: Internal Medicine

## 2019-06-11 ENCOUNTER — Ambulatory Visit: Payer: BC Managed Care – PPO | Admitting: Neurology

## 2019-06-11 ENCOUNTER — Encounter: Payer: Self-pay | Admitting: Internal Medicine

## 2019-06-11 ENCOUNTER — Other Ambulatory Visit: Payer: Self-pay | Admitting: Family Medicine

## 2019-06-11 ENCOUNTER — Ambulatory Visit (INDEPENDENT_AMBULATORY_CARE_PROVIDER_SITE_OTHER): Payer: BC Managed Care – PPO | Admitting: Neurology

## 2019-06-11 ENCOUNTER — Telehealth: Payer: Self-pay

## 2019-06-11 VITALS — BP 124/78 | HR 117 | Temp 97.3°F | Ht 65.5 in | Wt 258.6 lb

## 2019-06-11 DIAGNOSIS — M25572 Pain in left ankle and joints of left foot: Secondary | ICD-10-CM

## 2019-06-11 DIAGNOSIS — M3130 Wegener's granulomatosis without renal involvement: Secondary | ICD-10-CM | POA: Diagnosis not present

## 2019-06-11 DIAGNOSIS — F19982 Other psychoactive substance use, unspecified with psychoactive substance-induced sleep disorder: Secondary | ICD-10-CM

## 2019-06-11 DIAGNOSIS — J4540 Moderate persistent asthma, uncomplicated: Secondary | ICD-10-CM | POA: Diagnosis not present

## 2019-06-11 DIAGNOSIS — J301 Allergic rhinitis due to pollen: Secondary | ICD-10-CM | POA: Diagnosis not present

## 2019-06-11 DIAGNOSIS — I776 Arteritis, unspecified: Secondary | ICD-10-CM

## 2019-06-11 DIAGNOSIS — G609 Hereditary and idiopathic neuropathy, unspecified: Secondary | ICD-10-CM

## 2019-06-11 DIAGNOSIS — N059 Unspecified nephritic syndrome with unspecified morphologic changes: Secondary | ICD-10-CM

## 2019-06-11 MED ORDER — FLUTICASONE PROPIONATE 50 MCG/ACT NA SUSP: 1.0000 | Freq: Two times a day (BID) | NASAL | 5 refills | Status: DC

## 2019-06-11 MED ORDER — PROAIR HFA 108 (90 BASE) MCG/ACT IN AERS: 1.0000 | INHALATION_SPRAY | Freq: Every day | RESPIRATORY_TRACT | 5 refills | Status: DC | PRN

## 2019-06-11 NOTE — Procedures (Signed)
     HISTORY:  Dhriti Fales is a 39 year old patient with a history of Wegener's granulomatosis and a history of left-sided sciatica.  She has reported some sensory alteration and hypersensitivity of the hands and feet, she is sent for an evaluation of a possible peripheral neuropathy.  NERVE CONDUCTION STUDIES:  Nerve conduction studies were performed on both upper extremities. The distal motor latencies and motor amplitudes for the median and ulnar nerves were within normal limits. The nerve conduction velocities for these nerves were also normal. The sensory latencies for the median and ulnar nerves were normal. The F wave latencies for the ulnar nerves were within normal limits.  Nerve conduction studies were performed on both lower extremities. The distal motor latencies and motor amplitudes for the peroneal and posterior tibial nerves were within normal limits. The nerve conduction velocities for these nerves were also normal. The sensory latencies for the peroneal and sural nerves were within normal limits, with exception of a slight prolongation of the left sural sensory latency. The F wave latencies for the posterior tibial nerves were within normal limits.   EMG STUDIES:  EMG study was performed on the left lower extremity:  The tibialis anterior muscle reveals 2 to 4K motor units with full recruitment. No fibrillations or positive waves were seen. The peroneus tertius muscle reveals 2 to 4K motor units with full recruitment. No fibrillations or positive waves were seen. The medial gastrocnemius muscle reveals 1 to 3K motor units with full recruitment. No fibrillations or positive waves were seen. The vastus lateralis muscle reveals 2 to 4K motor units with full recruitment. No fibrillations or positive waves were seen. The iliopsoas muscle reveals 2 to 4K motor units with full recruitment. No fibrillations or positive waves were seen. The biceps femoris muscle (long head) reveals 2  to 4K motor units with full recruitment. No fibrillations or positive waves were seen. The lumbosacral paraspinal muscles were tested at 3 levels, and revealed no abnormalities of insertional activity at all 3 levels tested. There was good relaxation.   IMPRESSION:  Nerve conduction studies done on both upper and lower extremities were within normal limits with exception of an isolated prolongation of the left sural sensory latency.  There is no evidence of a generalized peripheral neuropathy or evidence of a mononeuritis multiplex.  EMG evaluation of the left lower extremity was unremarkable, no evidence of an overlying lumbosacral radiculopathy was seen.  Marlan Palau MD 06/11/2019 3:35 PM  [facility] [address] [place], [place] [address]  Phone [phone] Fax [phone]

## 2019-06-11 NOTE — Progress Notes (Signed)
Please refer to EMG and nerve conduction procedure note.  

## 2019-06-11 NOTE — Patient Instructions (Signed)
The patient should have follow up scheduled with myself in 2 months.   Flonase - 1 spray on each side of your nose twice a day.  Instructions for use:  If you also use a saline nasal spray or rinse, use that first.  Position the head with the chin slightly tucked. Use the right hand to spray into the left nostril and the right hand to spray into the left nostril.   Point the bottle away from the septum of your nose (cartilage that divides the two sides of your nose).   Hold the nostril closed on the opposite side from where you will spray  Spray once and gently sniff to pull the medicine into the higher parts of your nose.  Don't sniff too hard as the medicine will drain down the back of your throat instead.  Repeat with a second spray on the same side if prescribed.  Repeat on the other side of your nose.    Take the albuterol rescue inhaler every 4 to 6 hours as needed for wheezing or shortness of breath. You can also take it 15 minutes before exercise or exertional activity. Side effects include heart racing or pounding, jitters or anxiety. If you have a history of an irregular heart rhythm, it can make this worse. Can also give some patients a hard time sleeping.  To inhale the aerosol using an inhaler, follow these steps:  1. Remove the protective dust cap from the end of the mouthpiece. If the dust cap was not placed on the mouthpiece, check the mouthpiece for dirt or other objects. Be sure that the canister is fully and firmly inserted in the mouthpiece. 2. If you are using the inhaler for the first time or if you have not used the inhaler in more than 14 days, you will need to prime it. You may also need to prime the inhaler if it has been dropped. Ask your pharmacist or check the manufacturer's information if this happens. To prime the inhaler, shake it well and then press down on the canister 4 times to release 4 sprays into the air, away from your face. Be careful not to get  albuterol in your eyes. 3. Shake the inhaler well. 4. Breathe out as completely as possible through your mouth. 4. Hold the canister with the mouthpiece on the bottom, facing you and the canister pointing upward. Place the open end of the mouthpiece into your mouth. Close your lips tightly around the mouthpiece. 6. Breathe in slowly and deeply through the mouthpiece.At the same time, press down once on the container to spray the medication into your mouth. 7. Try to hold your breath for 10 seconds. remove the inhaler, and breathe out slowly. 8. If you were told to use 2 puffs, wait 1 minute and then repeat steps 3-7. 9. Replace the protective cap on the inhaler. 10. Clean your inhaler regularly. Follow the manufacturer's directions carefully and ask your doctor or pharmacist if you have any questions about cleaning your inhaler.  Check the back of the inhaler to keep track of the total number of doses left on the inhaler.

## 2019-06-11 NOTE — Telephone Encounter (Signed)
I called pt to discus her results. No answer, left a message asking her to call me back.

## 2019-06-11 NOTE — Telephone Encounter (Signed)
Pt returned my call. I advised her of the NCV/EMG results and recommendations. Pt is agreeable to this plan. Pt verbalized understanding of results. Pt had no questions at this time but was encouraged to call back if questions arise.

## 2019-06-11 NOTE — Progress Notes (Addendum)
Please call patient regarding her EMG and nerve conduction velocity test from today, findings are not suggestive of any widespread nerve damage thankfully, what we call neuropathy.  Also, no telltale evidence of any pinched nerve from the lower back.  I would recommend that she follow-up with her specialists including her nephrologist, rheumatologist and neurosurgeon as planned/scheduled.     MNC    Nerve / Sites Muscle Latency Ref. Amplitude Ref. Rel Amp Segments Distance Velocity Ref. Area    ms ms mV mV %  cm m/s m/s mVms  L Median - APB     Wrist APB 3.0 ?4.4 8.9 ?4.0 100 Wrist - APB 7   36.9     Upper arm APB 6.7  8.5  95.8 Upper arm - Wrist 22 61 ?49 34.9  R Median - APB     Wrist APB 3.0 ?4.4 6.8 ?4.0 100 Wrist - APB 7   20.1     Upper arm APB 7.1  6.6  97.3 Upper arm - Wrist 23 56 ?49 19.6  L Ulnar - ADM     Wrist ADM 2.8 ?3.3 10.8 ?6.0 100 Wrist - ADM 7   30.3     B.Elbow ADM 6.2  10.3  95.6 B.Elbow - Wrist 20 57 ?49 29.7     A.Elbow ADM 8.1  10.2  99.3 A.Elbow - B.Elbow 10 52 ?49 29.4         A.Elbow - Wrist      R Ulnar - ADM     Wrist ADM 2.2 ?3.3 11.3 ?6.0 100 Wrist - ADM 7   30.8     B.Elbow ADM 5.9  9.6  85.4 B.Elbow - Wrist 21 58 ?49 31.1     A.Elbow ADM 7.6  9.2  95.6 A.Elbow - B.Elbow 10 59 ?49 30.0         A.Elbow - Wrist      L Peroneal - EDB     Ankle EDB 5.8 ?6.5 4.5 ?2.0 100 Ankle - EDB 9   21.8     Fib head EDB 12.3  3.5  78 Fib head - Ankle 29 44 ?44 18.5     Pop fossa EDB 14.6  4.0  115 Pop fossa - Fib head 10 44 ?44 22.0         Pop fossa - Ankle      R Peroneal - EDB     Ankle EDB 5.7 ?6.5 5.5 ?2.0 100 Ankle - EDB 9   29.0     Fib head EDB 12.1  4.9  89.4 Fib head - Ankle 28 44 ?44 25.7     Pop fossa EDB 14.4  4.2  86.5 Pop fossa - Fib head 10 44 ?44 22.6         Pop fossa - Ankle      L Tibial - AH     Ankle AH 5.1 ?5.8 9.7 ?4.0 100 Ankle - AH 9   41.1     Pop fossa AH 14.2  10.9  113 Pop fossa - Ankle 37 41 ?41 49.4  R Tibial - AH     Ankle AH 4.7  ?5.8 14.9 ?4.0 100 Ankle - AH 9   66.2     Pop fossa AH 13.9  14.5  97.2 Pop fossa - Ankle 37 41 ?41 61.0                     SNC    Nerve /  Sites Rec. Site Peak Lat Ref.  Amp Ref. Segments Distance Peak Diff Ref.    ms ms V V  cm ms ms  L Sural - Ankle (Calf)     Calf Ankle 4.6 ?4.4 1 ?6 Calf - Ankle 14    R Sural - Ankle (Calf)     Calf Ankle 3.8 ?4.4 8 ?6 Calf - Ankle 14    L Superficial peroneal - Ankle     Lat leg Ankle 4.1 ?4.4 5 ?6 Lat leg - Ankle 14    R Superficial peroneal - Ankle     Lat leg Ankle 4.4 ?4.4 5 ?6 Lat leg - Ankle 14    L Median, Ulnar - Transcarpal comparison     Median Palm Wrist 1.9 ?2.2 86 ?35 Median Palm - Wrist 8       Ulnar Palm Wrist 1.9 ?2.2 24 ?12 Ulnar Palm - Wrist 8          Median Palm - Ulnar Palm  -0.0 ?0.4  R Median, Ulnar - Transcarpal comparison     Median Palm Wrist 1.9 ?2.2 88 ?35 Median Palm - Wrist 8       Ulnar Palm Wrist 1.8 ?2.2 34 ?12 Ulnar Palm - Wrist 8          Median Palm - Ulnar Palm  0.1 ?0.4  L Median - Orthodromic (Dig II, Mid palm)     Dig II Wrist 2.7 ?3.4 20 ?10 Dig II - Wrist 13    R Median - Orthodromic (Dig II, Mid palm)     Dig II Wrist 2.7 ?3.4 20 ?10 Dig II - Wrist 13    L Ulnar - Orthodromic, (Dig V, Mid palm)     Dig V Wrist 2.7 ?3.1 7 ?5 Dig V - Wrist 11    R Ulnar - Orthodromic, (Dig V, Mid palm)     Dig V Wrist 2.4 ?3.1 11 ?5 Dig V - Wrist 54                           F  Wave    Nerve F Lat Ref.   ms ms  L Tibial - AH 54.5 ?56.0  L Ulnar - ADM 30.3 ?32.0  R Tibial - AH 50.6 ?56.0  R Ulnar - ADM 29.7 ?32.0

## 2019-06-11 NOTE — Telephone Encounter (Signed)
-----   Message from [name], MD sent at 06/11/2019  3:57 PM EDT ----- Please call patient regarding her EMG and nerve conduction velocity test from today, findings are not suggestive of any widespread nerve damage thankfully, what we call neuropathy.  Also, no telltale evidence of any pinched nerve from the lower back.  I would recommend that she follow-up with her specialists including her nephrologist, rheumatologist and neurosurgeon as planned/scheduled.

## 2019-06-11 NOTE — Progress Notes (Signed)
Diane Wells    [id number]    09-17-1980  Primary Care Physician:Copland, Gwenlyn Found, MD Date of Appointment: 06/11/2019 Established Patient Visit  Chief complaint:   Chief Complaint  Patient presents with  . Follow-up    Patient reports that her breathing has gotten some better with the [facility].     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  I have detailed her asthma history below.  Her more recent history begins in the last year when she developed arthralgias and neuropathy as well as recurrent sinus issues in early 2020.   GPA history: Dr. Nickola Major at [facility] Rheumatology Dr. Frances Furbish in Neurology Dr. 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 Glenna Fellows. Biopsy deferred due to presumptive diagnosis ultrasound findings, and proteinuria and hematuria.  Interval Updates: Started Rituximab-abbs infusions. Has her 4th dose this week. Having allergic reactions including swollen lips with this and requiring significant pre-medications   She is back on 60 mg prednisone last week after having symptoms Feeling better with dulera. Her oxygen levels were going down with albuterol nebulizer  Having ongoing sinus issues, doing rinses of saline and baking soda with ENT.   Current Regimen: Dulera 2 puffs BID.  Asthma Triggers:seasonal and aenvironmental allergies Exacerbations in the last year: 2 History of hospitalization or intubation: none, ED visit in Feb 2021 Hives: anaphylaxis  Allergy Testing: Had SPT testing 3 years ago: grass, tree, mold, feather, cat dust mites. Wasn't able to keep up with SCIT in the past due to schedules  GERD: denies Allergic Rhinitis: yes ACT:  Asthma Control Test ACT Total Score  05/13/2019 18  02/15/2016 12   FeNO: 24 ppb while on high dose prednisone   I have reviewed the patient's family social and past medical history and  updated as appropriate.     Past Medical History:  Diagnosis Date  . Allergic rhinitis   . Asthma   . Chronic kidney disease   . Granulomatosis with polyangiitis (HCC)   . Hypertension   . Recurrent upper respiratory infection (URI)     Past Surgical History:  Procedure Laterality Date  . ADENOIDECTOMY    . ANTERIOR CERVICAL DECOMP/DISCECTOMY FUSION      Family History  Problem Relation Age of Onset  . Diabetes Mother        Type 1 Diabetes, juvenile onset  . Hypertension Mother   . Pulmonary Hypertension Mother   . Hyperthyroidism Mother   . Glaucoma Mother   . Neuropathy Mother   . Sjogren's syndrome Mother   . Carpal tunnel syndrome Mother   . Hypertension Father   . Gallbladder disease Father   . Hypertension Maternal Grandmother   . Glaucoma Maternal Grandmother   . Coronary artery disease Maternal Grandmother   . Heart attack Maternal Grandmother   . Angina Maternal Grandmother   . Macular degeneration Maternal Grandmother   . COPD Maternal Grandmother   . Congestive Heart Failure Maternal Grandmother   . Leukemia Paternal Grandmother   . Hypertension Paternal Grandmother   . Colon cancer Paternal Grandfather   . Diabetes Maternal Uncle   . Heart attack Maternal Uncle   . Stroke Maternal Uncle   . Glaucoma Maternal Grandfather   . Allergic rhinitis Neg Hx   . Angioedema Neg Hx   . Asthma Neg Hx   . Eczema Neg Hx     Social History  Occupational History  . Not on file  Tobacco Use  . Smoking status: Never Smoker  . Smokeless tobacco: Never Used  Substance and Sexual Activity  . Alcohol use: No  . Drug use: No  . Sexual activity: Not on file    Review of systems: Constitutional: No fevers, chills, night sweats, or weight loss. CV: No chest pain, or palpitations. Resp: No hemoptysis.  Physical Exam: Blood pressure 124/78, pulse (!) 117, temperature (!) 97.3 F (36.3 C), temperature source Temporal, height 5' 5.5" (1.664 m), weight 258 lb  9.6 oz (117.3 kg), last menstrual period 05/12/2019, SpO2 99 %.  Gen:      No acute distress, cushingoid, fatigued ENT:  no nasal polyps, mucus membranes moist Lungs:    No increased respiratory effort, symmetric chest wall excursion, clear to auscultation bilaterally, no wheezes or crackles CV:         Regular rate and rhythm; no murmurs, rubs, or gallops.  No pedal edema MSK: multiple bruises  Data Reviewed: Imaging: I have personally reviewed the chest xray Feb 2021 no acute cardiopulmonary process  PFTs:  PFT Results Latest Ref Rng & Units 06/03/2019  FVC-Pre L 3.87  FVC-Predicted Pre % 97  FVC-Post L 3.42  FVC-Predicted Post % 86  Pre FEV1/FVC % % 84  Post FEV1/FCV % % 88  FEV1-Pre L 3.27  FEV1-Predicted Pre % 100  FEV1-Post L 3.00  DLCO UNC% % 80  DLCO COR %Predicted % 95  TLC L 5.28  TLC % Predicted % 98  RV % Predicted % 86   I have personally reviewed the patient's PFTs and they are without airflow limitation, have normal lung volumes, and normal diffusion capacity. No significant BD response. Feno is 24ppb which is not elevated.   Labs:  Immunization status: Immunization History  Administered Date(s) Administered  . Influenza,inj,Quad PF,6+ Mos 01/10/2016, 05/16/2018, 01/28/2019  . Td 02/24/2016  . Tdap 03/27/2005    Assessment:  Granulomatosis with Polyangiitis Severe Persistent Asthma Allergic Rhinitis  Plan/Recommendations: Continue Dulera 2 puffs BID Continue Rituximab-abbs infusions  Continue prednisone taper per rheumatology. Hopefully we can start coming down on this soon. She is not on Bactrim for pcp ppx and would be unable to tolerate either bactrim or dapsone due to sulfa allergy. Will need to discuss atovaquone if she is not tapering down.  Will start fluticasone intra-nasal to help with rhinitis which is chronic Stop albuterol nebulizer - she may have an allergy to one of the additives in the nebulizer solution. Would trial MDI which she has  done well with in the past. Prn.   I spent 34 minutes on 06/11/2019 in care of this patient including face to face time and non-face to face time spent charting, review of outside records, and coordination of care.   Return to Care: Return in about 2 months (around 08/11/2019).   Lenice Llamas, MD Pulmonary and [place]

## 2019-06-12 DIAGNOSIS — M0579 Rheumatoid arthritis with rheumatoid factor of multiple sites without organ or systems involvement: Secondary | ICD-10-CM | POA: Diagnosis not present

## 2019-06-12 NOTE — Telephone Encounter (Signed)
Requesting: Lorazepam 0.5mg  Contract:n/a UDS:n/a Last Visit:05/19/19 Next Visit:n/a Last Refill:04/16/19(30,1)  Please Advise. Medication pending

## 2019-06-23 DIAGNOSIS — M310 Hypersensitivity angiitis: Secondary | ICD-10-CM | POA: Diagnosis not present

## 2019-06-23 DIAGNOSIS — H15009 Unspecified scleritis, unspecified eye: Secondary | ICD-10-CM | POA: Diagnosis not present

## 2019-06-23 DIAGNOSIS — M3130 Wegener's granulomatosis without renal involvement: Secondary | ICD-10-CM | POA: Diagnosis not present

## 2019-06-23 DIAGNOSIS — M0579 Rheumatoid arthritis with rheumatoid factor of multiple sites without organ or systems involvement: Secondary | ICD-10-CM | POA: Diagnosis not present

## 2019-06-24 ENCOUNTER — Ambulatory Visit (INDEPENDENT_AMBULATORY_CARE_PROVIDER_SITE_OTHER): Payer: BC Managed Care – PPO | Admitting: Psychology

## 2019-06-24 DIAGNOSIS — F4322 Adjustment disorder with anxiety: Secondary | ICD-10-CM | POA: Diagnosis not present

## 2019-07-01 DIAGNOSIS — M5440 Lumbago with sciatica, unspecified side: Secondary | ICD-10-CM | POA: Diagnosis not present

## 2019-07-02 DIAGNOSIS — N179 Acute kidney failure, unspecified: Secondary | ICD-10-CM | POA: Diagnosis not present

## 2019-07-02 DIAGNOSIS — M3130 Wegener's granulomatosis without renal involvement: Secondary | ICD-10-CM | POA: Diagnosis not present

## 2019-07-02 DIAGNOSIS — R809 Proteinuria, unspecified: Secondary | ICD-10-CM | POA: Diagnosis not present

## 2019-07-02 DIAGNOSIS — I10 Essential (primary) hypertension: Secondary | ICD-10-CM | POA: Diagnosis not present

## 2019-07-08 ENCOUNTER — Ambulatory Visit (INDEPENDENT_AMBULATORY_CARE_PROVIDER_SITE_OTHER): Payer: BC Managed Care – PPO | Admitting: Psychology

## 2019-07-08 DIAGNOSIS — F4322 Adjustment disorder with anxiety: Secondary | ICD-10-CM | POA: Diagnosis not present

## 2019-07-10 ENCOUNTER — Ambulatory Visit: Payer: BC Managed Care – PPO | Admitting: Orthopaedic Surgery

## 2019-07-10 ENCOUNTER — Encounter: Payer: Self-pay | Admitting: Orthopaedic Surgery

## 2019-07-10 ENCOUNTER — Ambulatory Visit: Payer: Self-pay

## 2019-07-10 ENCOUNTER — Other Ambulatory Visit: Payer: Self-pay

## 2019-07-10 DIAGNOSIS — M25551 Pain in right hip: Secondary | ICD-10-CM | POA: Diagnosis not present

## 2019-07-10 DIAGNOSIS — M25562 Pain in left knee: Secondary | ICD-10-CM

## 2019-07-10 DIAGNOSIS — M25561 Pain in right knee: Secondary | ICD-10-CM | POA: Diagnosis not present

## 2019-07-10 DIAGNOSIS — M5442 Lumbago with sciatica, left side: Secondary | ICD-10-CM

## 2019-07-10 DIAGNOSIS — M25552 Pain in left hip: Secondary | ICD-10-CM | POA: Diagnosis not present

## 2019-07-10 DIAGNOSIS — G8929 Other chronic pain: Secondary | ICD-10-CM

## 2019-07-10 NOTE — Progress Notes (Signed)
Office Visit Note   Patient: Diane Wells           Date of Birth: 1981/02/04           MRN: [id number] Visit Date: 07/10/2019              Requested by: Darreld Mclean, MD [address] [place],  [place] [address] PCP: Darreld Mclean, MD   Assessment & Plan: Visit Diagnoses:  1. Left-sided low back pain with left-sided sciatica, unspecified chronicity   2. Bilateral hip pain   3. Chronic pain of both knees     Plan: She did let me know that she got a lot of reassurance of the x-rays from her hips and knees showing no significant arthritic issues that would require a surgical intervention.  This seems to be definitely more of a soft tissue issue with hip bursitis.  She will work on stretching exercises at some point and hopefully lose some weight that will help with her hips as well.  She is already been through therapy as well.  I have also recommended a topical anti-inflammatory if possible.  All question concerns were answered addressed.  Follow-up as otherwise as needed.  Follow-Up Instructions: Return if symptoms worsen or fail to improve.   Orders:  Orders Placed This Encounter  Procedures  . XR Lumbar Spine 2-3 Views  . XR HIPS BILAT W OR W/O PELVIS 2V  . XR Knee 1-2 Views Right  . XR Knee 1-2 Views Left   No orders of the defined types were placed in this encounter.     Procedures: No procedures performed   Clinical Data: No additional findings.   Subjective: Chief Complaint  Patient presents with  . Left Hip - Pain  The patient is a very pleasant 39 year old female who is referred from Dr. Kary Kos with neurosurgery to evaluate the patient's left hip pain.  She has been dealing with actually bilateral hip pain and bilateral knee pain for some time.  She reports that she actually injured her right knee remotely with likely some type of injury to the ACL.  She reports that her patella on the right side does subluxate at times.   She does feel like both her hips can do the same with some type of subluxating.  Her left knee also bothers her.  A lot of her hip pain occurs over the trochanteric area and radiates down the IT band is where she points.  She does have some proximal hip pain but none groin pain on either side.  She is also someone who does have significant rheumatologic issues.  She is unfortunately had to be on prednisone since January of this year and that is because a significant amount of weight gain.  She also has a history of vasculitis.  HPI  Review of Systems She currently denies any fever, chills, nausea, vomiting.  She denies any chest pain today.  Objective: Vital Signs: There were no vitals taken for this visit.  Physical Exam She is alert and orient x3 and in no acute distress Ortho Exam On examination she is very flexible and has easily mobile joints.  Both hips and knees have full range of motion with no difficulties or blocks to motion.  Both knees actually hyperextend.  Her patellas do slightly track laterally.  Examination of both knees shows a ligamentously stable with no instability on varus valgus stressing and a negative drawer sign bilaterally.  She does have some global tenderness with the knees.  She does get the sensation as if her right knee does have some more fullness to it.  There is some patellofemoral crepitation on both knees on exam.  Both hips have full and fluid range of motion with only pain to palpation of the trochanteric area on both sides of the left much worse than the right. Specialty Comments:  No specialty comments available.  Imaging: XR HIPS BILAT W OR W/O PELVIS 2V  Result Date: 07/10/2019 An AP pelvis and lateral of both hips shows no acute findings.  The hip joints and spaces are well-maintained.  XR Knee 1-2 Views Left  Result Date: 07/10/2019 2 views of the left knee show no acute findings with well-maintained joint space and neutral alignment.  XR Knee  1-2 Views Right  Result Date: 07/10/2019 2 views of the right knee show well-maintained joint space with no acute findings.  There is no malalignment.  XR Lumbar Spine 2-3 Views  Result Date: 07/10/2019 2 views of the lumbar spine show no acute findings.    PMFS History: Patient Active Problem List   Diagnosis Date Noted  . Granulomatosis with polyangiitis (HCC) 05/27/2019  . Medication management 05/27/2019  . Patellar dislocation 01/17/2018  . Acute left ankle pain 11/27/2017  . Allergic reaction 02/15/2016  . Moderate persistent asthma without complication 02/15/2016  . Perennial and seasonal allergic rhinitis 02/15/2016  . Allergic conjunctivitis 02/15/2016  . Obesity, unspecified obesity severity, unspecified obesity type 01/10/2016   Past Medical History:  Diagnosis Date  . Allergic rhinitis   . Asthma   . Chronic kidney disease   . Granulomatosis with polyangiitis (HCC)   . Hypertension   . Recurrent upper respiratory infection (URI)     Family History  Problem Relation Age of Onset  . Diabetes Mother        Type 1 Diabetes, juvenile onset  . Hypertension Mother   . Pulmonary Hypertension Mother   . Hyperthyroidism Mother   . Glaucoma Mother   . Neuropathy Mother   . Sjogren's syndrome Mother   . Carpal tunnel syndrome Mother   . Hypertension Father   . Gallbladder disease Father   . Hypertension Maternal Grandmother   . Glaucoma Maternal Grandmother   . Coronary artery disease Maternal Grandmother   . Heart attack Maternal Grandmother   . Angina Maternal Grandmother   . Macular degeneration Maternal Grandmother   . COPD Maternal Grandmother   . Congestive Heart Failure Maternal Grandmother   . Leukemia Paternal Grandmother   . Hypertension Paternal Grandmother   . Colon cancer Paternal Grandfather   . Diabetes Maternal Uncle   . Heart attack Maternal Uncle   . Stroke Maternal Uncle   . Glaucoma Maternal Grandfather   . Allergic rhinitis Neg Hx   .  Angioedema Neg Hx   . Asthma Neg Hx   . Eczema Neg Hx     Past Surgical History:  Procedure Laterality Date  . ADENOIDECTOMY    . ANTERIOR CERVICAL DECOMP/DISCECTOMY FUSION     Social History   Occupational History  . Not on file  Tobacco Use  . Smoking status: Never Smoker  . Smokeless tobacco: Never Used  Substance and Sexual Activity  . Alcohol use: No  . Drug use: No  . Sexual activity: Not on file

## 2019-07-11 ENCOUNTER — Other Ambulatory Visit (HOSPITAL_COMMUNITY): Payer: BC Managed Care – PPO

## 2019-07-16 DIAGNOSIS — H15013 Anterior scleritis, bilateral: Secondary | ICD-10-CM | POA: Diagnosis not present

## 2019-07-16 DIAGNOSIS — H5000 Unspecified esotropia: Secondary | ICD-10-CM | POA: Diagnosis not present

## 2019-07-16 DIAGNOSIS — H53042 Amblyopia suspect, left eye: Secondary | ICD-10-CM | POA: Diagnosis not present

## 2019-07-22 ENCOUNTER — Other Ambulatory Visit (HOSPITAL_COMMUNITY): Payer: BC Managed Care – PPO

## 2019-07-25 ENCOUNTER — Encounter: Payer: Self-pay | Admitting: Internal Medicine

## 2019-07-25 ENCOUNTER — Ambulatory Visit: Payer: BC Managed Care – PPO | Admitting: Internal Medicine

## 2019-07-25 ENCOUNTER — Other Ambulatory Visit: Payer: Self-pay

## 2019-07-25 VITALS — BP 130/90 | HR 116 | Temp 97.9°F | Ht 66.0 in | Wt 270.0 lb

## 2019-07-25 DIAGNOSIS — J4540 Moderate persistent asthma, uncomplicated: Secondary | ICD-10-CM | POA: Diagnosis not present

## 2019-07-25 DIAGNOSIS — J310 Chronic rhinitis: Secondary | ICD-10-CM

## 2019-07-25 DIAGNOSIS — I776 Arteritis, unspecified: Secondary | ICD-10-CM

## 2019-07-25 DIAGNOSIS — I7782 Antineutrophilic cytoplasmic antibody (ANCA) vasculitis: Secondary | ICD-10-CM

## 2019-07-25 MED ORDER — MOMETASONE FURO-FORMOTEROL FUM 100-5 MCG/ACT IN AERO: 2.0000 | INHALATION_SPRAY | Freq: Two times a day (BID) | RESPIRATORY_TRACT | 5 refills | Status: DC

## 2019-07-25 NOTE — Patient Instructions (Signed)
The patient should have follow up scheduled with Elisha Headland in 3 months.  She will see me again after that.

## 2019-07-25 NOTE — Progress Notes (Signed)
Diane Wells    [id number]    02/13/1981  Primary Care Physician:Copland, Gay Filler, MD Date of Appointment: 07/25/2019 Established Patient Visit  Chief complaint:   Chief Complaint  Patient presents with  . Follow-up    used inhaler 45 min prior for sob, pollen caused sob.  Ruthe Mannan is working.  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  I have detailed her asthma history below.  Her more recent history begins in the last year when she developed arthralgias and neuropathy as well as recurrent sinus issues in early 2020.   GPA history: Dr. Trudie Reed at [facility] Dr. Rexene Alberts in Neurology Dr. 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 Johnney Ou. Biopsy deferred due to presumptive diagnosis ultrasound findings, and proteinuria and hematuria.  Interval Updates: S/p rituximab- abbs infusion x4. Last dose about 6-7 weeks ago.  Having vasculitis in eye and is on prednisone eye drops. Down to 40 mg prednisone. On Dapsone for PCP ppx.   No reflux, heart burn. [facility] this [facility]. Going well so far.  Still using lorazepam, but now very sparingly. Overall feeling better now she is on a little bit less prednisone. She has plans to get the Covid vaccine in between her rituximab-abbs infusions.  Likely sometime June or July.  Not currently ready to try immunotherapy for asthma.   Current Regimen: Dulera 2 puffs BID. Albuterol MDI (allergic to nebulizer solution) using 2-3 times/week.  Asthma Triggers:seasonal and environmental allergies Exacerbations in the last year: 2 History of hospitalization or intubation: none, ED visit in Feb 2021 Hives: anaphylaxis  Allergy Testing: Had SPT testing 3 years ago: grass, tree, mold, feather, cat dust mites. Wasn't able to keep up with SCIT in the past due  to schedules  GERD: denies Allergic Rhinitis: yes ACT:  Asthma Control Test ACT Total Score  05/13/2019 18  02/15/2016 12   FeNO: 24 ppb while on high dose prednisone  I have reviewed the patient's family social and past medical history and updated as appropriate.   Past Medical History:  Diagnosis Date  . Allergic rhinitis   . Asthma   . Chronic kidney disease   . Granulomatosis with polyangiitis ([place])   . Hypertension   . Recurrent upper respiratory infection (URI)     Past Surgical History:  Procedure Laterality Date  . ADENOIDECTOMY    . ANTERIOR CERVICAL DECOMP/DISCECTOMY FUSION      Family History  Problem Relation Age of Onset  . Diabetes Mother        Type 1 Diabetes, juvenile onset  . Hypertension Mother   . Pulmonary Hypertension Mother   . Hyperthyroidism Mother   . Glaucoma Mother   . Neuropathy Mother   . Sjogren's syndrome Mother   . Carpal tunnel syndrome Mother   . Hypertension Father   . Gallbladder disease Father   . Hypertension Maternal Grandmother   . Glaucoma Maternal Grandmother   . Coronary artery disease Maternal Grandmother   . Heart attack Maternal Grandmother   . Angina Maternal Grandmother   . Macular degeneration Maternal Grandmother   . COPD Maternal Grandmother   . Congestive Heart Failure Maternal Grandmother   . Leukemia Paternal Grandmother   . Hypertension Paternal Grandmother   . Colon cancer Paternal Grandfather   . Diabetes Maternal  Uncle   . Heart attack Maternal Uncle   . Stroke Maternal Uncle   . Glaucoma Maternal Grandfather   . Allergic rhinitis Neg Hx   . Angioedema Neg Hx   . Asthma Neg Hx   . Eczema Neg Hx     Social History   Occupational History  . Not on file  Tobacco Use  . Smoking status: Never Smoker  . Smokeless tobacco: Never Used  Substance and Sexual Activity  . Alcohol use: No  . Drug use: No  . Sexual activity: Not on file    Review of systems: Constitutional: No fevers, chills, night  sweats, or weight loss. CV: No chest pain, or palpitations. Resp: No hemoptysis.  Physical Exam: Blood pressure 130/90, pulse (!) 116, temperature 97.9 F (36.6 C), temperature source Temporal, height 5\' 6"  (1.676 m), weight 270 lb (122.5 kg), SpO2 96 %.  Gen:      No acute distress, cushingoid, fatigued ENT:  no nasal polyps, mucus membranes moist Lungs:    No increased respiratory effort, symmetric chest wall excursion, clear to auscultation bilaterally, no wheezes or crackles CV:         Regular rate and rhythm; no murmurs, rubs, or gallops.  No pedal edema MSK: multiple bruises  Data Reviewed: Imaging: I have personally reviewed the chest xray Feb 2021 no acute cardiopulmonary process  PFTs:  PFT Results Latest Ref Rng & Units 06/03/2019  FVC-Pre L 3.87  FVC-Predicted Pre % 97  FVC-Post L 3.42  FVC-Predicted Post % 86  Pre FEV1/FVC % % 84  Post FEV1/FCV % % 88  FEV1-Pre L 3.27  FEV1-Predicted Pre % 100  FEV1-Post L 3.00  DLCO UNC% % 80  DLCO COR %Predicted % 95  TLC L 5.28  TLC % Predicted % 98  RV % Predicted % 86   I have personally reviewed the patient's PFTs and they are without airflow limitation, have normal lung volumes, and normal diffusion capacity. No significant BD response. Feno is 24ppb which is not elevated.   Labs: Lab Results  Component Value Date   WBC 13.6 (H) 05/23/2019   HGB 11.0 (L) 05/23/2019   HCT 33.5 (L) 05/23/2019   MCV 99.4 05/23/2019   PLT 210 05/23/2019   Lab Results  Component Value Date   NA 135 05/23/2019   K 3.4 (L) 05/23/2019   CL 101 05/23/2019   CO2 25 05/23/2019   Immunization status: Immunization History  Administered Date(s) Administered  . Influenza,inj,Quad PF,6+ Mos 01/10/2016, 05/16/2018, 01/28/2019  . Td 02/24/2016  . Tdap 03/27/2005    Assessment:  Granulomatosis with Polyangiitis Severe Persistent Asthma Allergic Rhinitis  Plan/Recommendations: Continue Dulera 2 puffs BID.  She is doing much better  today.  She becomes more symptomatic as we come down on her prednisone, and add budesonide nebulizers to her Advair therapy. Continue Rituximab-abbs infusions.  She is due to have her Covid vaccine in between confusion courses. Continue prednisone taper per rheumatology.  She is on dapsone for PCP prophylaxis. Continue fluticasone intra-nasal to help with rhinitis which is chronic  Return to Care: Return in about 3 months (around 10/24/2019). She will follow up with an APP over the summer and I will see her again on return.  10/26/2019, MD Pulmonary and Critical Care Medicine [facility] Office:[phone]

## 2019-07-31 ENCOUNTER — Ambulatory Visit: Payer: BC Managed Care – PPO | Admitting: Psychology

## 2019-08-01 ENCOUNTER — Ambulatory Visit (INDEPENDENT_AMBULATORY_CARE_PROVIDER_SITE_OTHER): Payer: BC Managed Care – PPO | Admitting: Psychology

## 2019-08-01 DIAGNOSIS — F4322 Adjustment disorder with anxiety: Secondary | ICD-10-CM

## 2019-08-05 DIAGNOSIS — H15009 Unspecified scleritis, unspecified eye: Secondary | ICD-10-CM | POA: Diagnosis not present

## 2019-08-05 DIAGNOSIS — R809 Proteinuria, unspecified: Secondary | ICD-10-CM | POA: Diagnosis not present

## 2019-08-05 DIAGNOSIS — M3130 Wegener's granulomatosis without renal involvement: Secondary | ICD-10-CM | POA: Diagnosis not present

## 2019-08-05 DIAGNOSIS — M0579 Rheumatoid arthritis with rheumatoid factor of multiple sites without organ or systems involvement: Secondary | ICD-10-CM | POA: Diagnosis not present

## 2019-08-11 ENCOUNTER — Other Ambulatory Visit: Payer: Self-pay | Admitting: Family Medicine

## 2019-08-11 ENCOUNTER — Ambulatory Visit
Admission: RE | Admit: 2019-08-11 | Discharge: 2019-08-11 | Disposition: A | Discharge status: Home / Self Care | Payer: BC Managed Care – PPO | Source: Ambulatory Visit | Attending: Family Medicine | Admitting: Family Medicine

## 2019-08-11 ENCOUNTER — Other Ambulatory Visit: Payer: Self-pay

## 2019-08-11 DIAGNOSIS — N63 Unspecified lump in unspecified breast: Secondary | ICD-10-CM

## 2019-08-11 DIAGNOSIS — R928 Other abnormal and inconclusive findings on diagnostic imaging of breast: Secondary | ICD-10-CM | POA: Diagnosis not present

## 2019-08-11 DIAGNOSIS — N6021 Fibroadenosis of right breast: Secondary | ICD-10-CM | POA: Diagnosis not present

## 2019-08-13 ENCOUNTER — Ambulatory Visit (INDEPENDENT_AMBULATORY_CARE_PROVIDER_SITE_OTHER): Payer: BC Managed Care – PPO | Admitting: Psychology

## 2019-08-13 DIAGNOSIS — F4322 Adjustment disorder with anxiety: Secondary | ICD-10-CM

## 2019-08-15 DIAGNOSIS — H5000 Unspecified esotropia: Secondary | ICD-10-CM | POA: Diagnosis not present

## 2019-08-15 DIAGNOSIS — Z7952 Long term (current) use of systemic steroids: Secondary | ICD-10-CM | POA: Diagnosis not present

## 2019-08-15 DIAGNOSIS — H53042 Amblyopia suspect, left eye: Secondary | ICD-10-CM | POA: Diagnosis not present

## 2019-08-15 DIAGNOSIS — H15013 Anterior scleritis, bilateral: Secondary | ICD-10-CM | POA: Diagnosis not present

## 2019-08-18 ENCOUNTER — Ambulatory Visit: Payer: BC Managed Care – PPO | Admitting: Family Medicine

## 2019-09-05 ENCOUNTER — Ambulatory Visit (INDEPENDENT_AMBULATORY_CARE_PROVIDER_SITE_OTHER): Payer: BC Managed Care – PPO | Admitting: Psychology

## 2019-09-05 DIAGNOSIS — F4322 Adjustment disorder with anxiety: Secondary | ICD-10-CM

## 2019-09-24 ENCOUNTER — Ambulatory Visit: Payer: BC Managed Care – PPO | Admitting: Psychology

## 2019-09-25 DIAGNOSIS — M3130 Wegener's granulomatosis without renal involvement: Secondary | ICD-10-CM | POA: Diagnosis not present

## 2019-09-25 DIAGNOSIS — I10 Essential (primary) hypertension: Secondary | ICD-10-CM | POA: Diagnosis not present

## 2019-09-25 DIAGNOSIS — N179 Acute kidney failure, unspecified: Secondary | ICD-10-CM | POA: Diagnosis not present

## 2019-09-25 DIAGNOSIS — R809 Proteinuria, unspecified: Secondary | ICD-10-CM | POA: Diagnosis not present

## 2019-10-10 DIAGNOSIS — H15009 Unspecified scleritis, unspecified eye: Secondary | ICD-10-CM | POA: Diagnosis not present

## 2019-10-10 DIAGNOSIS — M0579 Rheumatoid arthritis with rheumatoid factor of multiple sites without organ or systems involvement: Secondary | ICD-10-CM | POA: Diagnosis not present

## 2019-10-10 DIAGNOSIS — R809 Proteinuria, unspecified: Secondary | ICD-10-CM | POA: Diagnosis not present

## 2019-10-10 DIAGNOSIS — M3130 Wegener's granulomatosis without renal involvement: Secondary | ICD-10-CM | POA: Diagnosis not present

## 2019-10-17 ENCOUNTER — Ambulatory Visit (INDEPENDENT_AMBULATORY_CARE_PROVIDER_SITE_OTHER): Payer: BC Managed Care – PPO | Admitting: Psychology

## 2019-10-17 DIAGNOSIS — F4322 Adjustment disorder with anxiety: Secondary | ICD-10-CM

## 2019-10-24 ENCOUNTER — Ambulatory Visit: Payer: BC Managed Care – PPO | Admitting: Pulmonary Disease

## 2019-10-24 ENCOUNTER — Encounter: Payer: Self-pay | Admitting: Pulmonary Disease

## 2019-10-24 ENCOUNTER — Other Ambulatory Visit: Payer: Self-pay

## 2019-10-24 ENCOUNTER — Ambulatory Visit (INDEPENDENT_AMBULATORY_CARE_PROVIDER_SITE_OTHER): Payer: BC Managed Care – PPO

## 2019-10-24 VITALS — BP 122/86 | HR 98 | Ht 66.0 in | Wt 282.6 lb

## 2019-10-24 DIAGNOSIS — J3089 Other allergic rhinitis: Secondary | ICD-10-CM

## 2019-10-24 DIAGNOSIS — I776 Arteritis, unspecified: Secondary | ICD-10-CM | POA: Diagnosis not present

## 2019-10-24 DIAGNOSIS — I7782 Antineutrophilic cytoplasmic antibody (ANCA) vasculitis: Secondary | ICD-10-CM

## 2019-10-24 DIAGNOSIS — R0600 Dyspnea, unspecified: Secondary | ICD-10-CM | POA: Diagnosis not present

## 2019-10-24 DIAGNOSIS — M3130 Wegener's granulomatosis without renal involvement: Secondary | ICD-10-CM

## 2019-10-24 DIAGNOSIS — J4540 Moderate persistent asthma, uncomplicated: Secondary | ICD-10-CM | POA: Diagnosis not present

## 2019-10-24 MED ORDER — MOMETASONE FURO-FORMOTEROL FUM 200-5 MCG/ACT IN AERO: 2.0000 | INHALATION_SPRAY | Freq: Two times a day (BID) | RESPIRATORY_TRACT | Status: DC

## 2019-10-24 NOTE — Progress Notes (Signed)
'@Patient'  ID: Diane Wells, female    DOB: 15-Mar-1981, 39 y.o.   MRN: [id number]  Chief Complaint  Patient presents with  . Follow-up    pt has wheezing , sob for last couple weeks taking predinsone for syms    Referring provider: Copland, Gay Filler, MD  HPI:  39 year old female never smoker followed in our office for clinical history of asthma.  Patient also recently diagnosed with granulomatosis with polyangiitis.  PMH: Obesity Smoker/ Smoking History: Never smoker Maintenance: 12.5 mg of prednisone (managed by rheumatology) Pt of: Dr. Shearon Stalls  Rheumatology - Dr. Trudie Reed at [facility] Dr. Rexene Alberts in Neurology Dr. 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 Johnney Ou. Biopsy deferred due to presumptive diagnosis ultrasound findings, and proteinuria and hematuria.  Currently on 54m prednisone taper - tomorrow goes to 50 mg. On dapsone.  Ongoing symptoms: neuropathy, lower extremity arthralgias, sinusitis.   10/24/2019  - Visit   39year old female never smoker followed in our office for clinical history of asthma. She is followed by Dr. DShearon Stalls She is also acutely followed by neurology, family medicine, ophthalmology as well as nephrology and rheumatology. [facility] granul[facility]osis with polyangiitis. They have been tapering down her prednisone.  Patient is presenting to office today reporting increased shortness of breath and wheezing.  Patient reports that she is planning on seeing rheumatology next month.  Plan of care at that office visit is to start CellCept.  They were actively working to try to taper down her prednisone dosing because she was feeling that she was developing symptoms of Cushing's.  Patient was also hoping to be able to obtain the Covid vaccine.  Unfortunately as the prednisone has been tapered down she  started to have worsening symptoms of shortness of breath, cough, congestion, wheezing, fatigue, she is had increased her prednisone eyedrops, she is followed by ophthalmology.  Patient was found to be intolerant of infusions/bio similar agents.  If patient is intolerant of CellCept plan of care outlined by Dr[facility]ud[facility]ology [facility]  Questionaires / Pulmonary Flowsheets:   ACT:  Asthma Control Test ACT Total Score  05/13/2019 18  02/15/2016 12    MMRC: No flowsheet data found.  Epworth:  No flowsheet data found.  Tests:   Note from 05/13/2019 with Dr. DShearon Stallsfeatures these lab results: Anti- MPO negative PR-3 elevated to 6.6 (0-3.5 nl) C-ANCA 1:40 (1:20 normal) P-ANCA not elevated +RF Elevated ESR  FENO:  Lab Results  Component Value Date   NITRICOXIDE 24 06/03/2019    PFT: PFT Results Latest Ref Rng & Units 06/03/2019  FVC-Pre L 3.87  FVC-Predicted Pre % 97  FVC-Post L 3.42  FVC-Predicted Post % 86  Pre FEV1/FVC % % 84  Post FEV1/FCV % % 88  FEV1-Pre L 3.27  FEV1-Predicted Pre % 100  FEV1-Post L 3.00  DLCO uncorrected ml/min/mmHg 18.88  DLCO UNC% % 80  DLCO corrected ml/min/mmHg 20.57  DLCO COR %Predicted % 87  DLVA Predicted % 95  TLC L 5.28  TLC % Predicted % 98  RV % Predicted % 86    WALK:  SIX MIN WALK 05/27/2019  Supplimental Oxygen during Test? (L/min) No  Tech Comments: Patient walked slow pace and maintained good O2 sats.    Imaging: No results found.  Lab Results:  CBC    Component Value Date/Time  WBC 13.6 (H) 05/23/2019 2010   RBC 3.37 (L) 05/23/2019 2010   HGB 11.0 (L) 05/23/2019 2010   HCT 33.5 (L) 05/23/2019 2010   PLT 210 05/23/2019 2010   MCV 99.4 05/23/2019 2010   MCH 32.6 05/23/2019 2010   MCHC 32.8 05/23/2019 2010   RDW 15.6 (H) 05/23/2019 2010   LYMPHSABS 1.2 05/23/2019 2010   MONOABS 1.1 (H) 05/23/2019 2010   EOSABS 0.1 05/23/2019 2010   BASOSABS 0.0 05/23/2019 2010    BMET    Component  Value Date/Time   NA 135 05/23/2019 2010   K 3.4 (L) 05/23/2019 2010   CL 101 05/23/2019 2010   CO2 25 05/23/2019 2010   GLUCOSE 114 (H) 05/23/2019 2010   BUN 34 (H) 05/23/2019 2010   CREATININE 1.03 (H) 05/23/2019 2010   CALCIUM 8.6 (L) 05/23/2019 2010   GFRNONAA >60 05/23/2019 2010   GFRAA >60 05/23/2019 2010    BNP No results found for: BNP  ProBNP No results found for: PROBNP  Specialty Problems      Pulmonary Problems   Moderate persistent asthma without complication   Perennial and seasonal allergic rhinitis      Allergies  Allergen Reactions  . Albuterol Other (See Comments) and Shortness Of Breath  . Other Anaphylaxis    Essential Oil, scent unknown, caused tongue swelling  . Truxima [Rituximab] Anaphylaxis  . Penicillins Hives  . Sulfa Antibiotics     Immunization History  Administered Date(s) Administered  . Influenza,inj,Quad PF,6+ Mos 01/10/2016, 05/16/2018, 01/28/2019  . Td 02/24/2016  . Tdap 03/27/2005    Past Medical History:  Diagnosis Date  . Allergic rhinitis   . Asthma   . Chronic kidney disease   . Granulomatosis with polyangiitis ([place])   . Hypertension   . Recurrent upper respiratory infection (URI)     Tobacco History: Social History   Tobacco Use  Smoking Status Never Smoker  Smokeless Tobacco Never Used   Counseling given: Not Answered   Continue to not smoke  Outpatient Encounter Medications as of 10/24/2019  Medication Sig  . butalbital-acetaminophen-caffeine (FIORICET) 50-325-40 MG tablet TAKE 1-2 TABLETS BY MOUTH EVERY 6 (SIX) HOURS AS NEEDED FOR HEADACHE. MAX 6 PER DAY  . dapsone 100 MG tablet Take 100 mg by mouth daily.  Marland Kitchen EPINEPHrine 0.3 mg/0.3 mL IJ SOAJ injection INJECT 0.3 MLS (0.3 MG TOTAL) INTO THE MUSCLE ONCE FOR 1 DOSE.  . fluticasone (FLONASE) 50 MCG/ACT nasal spray Place 1 spray into both nostrils in the morning and at bedtime.  . gabapentin (NEURONTIN) 300 MG capsule TAKE 1 CAPSULE BY MOUTH THREE TIMES A  DAY (Patient taking differently: As needed)  . losartan (COZAAR) 25 MG tablet Take 25 mg by mouth daily.   . methocarbamol (ROBAXIN) 750 MG tablet TAKE 1 TABLET (750 MG TOTAL) BY MOUTH EVERY 8 (EIGHT) HOURS AS NEEDED FOR MUSCLE SPASMS. (Patient taking differently: Take 750 mg by mouth every 8 (eight) hours as needed for muscle spasms. As needed)  . mometasone-formoterol (DULERA) 100-5 MCG/ACT AERO Inhale 2 puffs into the lungs every 12 (twelve) hours. 2 puffs in the morning right when you wake up, rinse out your mouth after use, 12 hours later 2 puffs, rinse after use  . prednisoLONE acetate (PRED FORTE) 1 % ophthalmic suspension 1 drop 3 (three) times daily.  . predniSONE (DELTASONE) 20 MG tablet 20 mg 2 (two) times daily with a meal. Pt is on 10 mg daily  . PROAIR HFA 108 (90 Base) MCG/ACT  inhaler Inhale 1-2 puffs into the lungs daily as needed for shortness of breath.  Marland Kitchen LORazepam (ATIVAN) 0.5 MG tablet TAKE 1-2 TABLETS (0.5-1 MG TOTAL) BY MOUTH AT BEDTIME. USE AS NEEDED FOR SHORT TERM INSOMNIA   Facility-Administered Encounter Medications as of 10/24/2019  Medication  . mometasone-formoterol (DULERA) 200-5 MCG/ACT inhaler 2 puff     Review of Systems  Review of Systems  Constitutional: Positive for fatigue. Negative for activity change and fever.  HENT: Positive for congestion and rhinorrhea. Negative for sinus pressure, sinus pain and sore throat.   Respiratory: Positive for cough (productive with mucous ), shortness of breath and wheezing.   Cardiovascular: Negative for chest pain and palpitations.  Gastrointestinal: Negative for diarrhea, nausea and vomiting.  Musculoskeletal: Negative for arthralgias.  Neurological: Negative for dizziness.  Psychiatric/Behavioral: Negative for sleep disturbance. The patient is not nervous/anxious.      Physical Exam  BP (!) 122/86   Pulse 98   Ht '5\' 6"'  (1.676 m)   Wt (!) 282 lb 9.6 oz (128.2 kg)   SpO2 96%   BMI 45.61 kg/m   Wt Readings  from Last 5 Encounters:  10/24/19 (!) 282 lb 9.6 oz (128.2 kg)  07/25/19 270 lb (122.5 kg)  06/11/19 258 lb 9.6 oz (117.3 kg)  05/27/19 259 lb 9.6 oz (117.8 kg)  05/23/19 260 lb (117.9 kg)    BMI Readings from Last 5 Encounters:  10/24/19 45.61 kg/m  07/25/19 43.58 kg/m  06/11/19 42.38 kg/m  05/27/19 41.90 kg/m  05/23/19 43.27 kg/m     Physical Exam Vitals and nursing note reviewed.  Constitutional:      General: She is not in acute distress.    Appearance: Normal appearance. She is obese. She is ill-appearing.  HENT:     Head: Normocephalic and atraumatic.     Right Ear: External ear normal.     Left Ear: External ear normal.     Nose: Nose normal. No congestion.     Mouth/Throat:     Mouth: Mucous membranes are moist.     Pharynx: Oropharynx is clear.  Eyes:     General: Scleral icterus present.     Pupils: Pupils are equal, round, and reactive to light.  Cardiovascular:     Rate and Rhythm: Normal rate and regular rhythm.     Pulses: Normal pulses.     Heart sounds: Normal heart sounds. No murmur heard.   Pulmonary:     Effort: Pulmonary effort is normal. No respiratory distress.     Breath sounds: No decreased air movement. No decreased breath sounds, wheezing or rales.  Musculoskeletal:     Cervical back: Normal range of motion.  Skin:    General: Skin is warm and dry.     Capillary Refill: Capillary refill takes less than 2 seconds.  Neurological:     General: No focal deficit present.     Mental Status: She is alert and oriented to person, place, and time. Mental status is at baseline.     Gait: Gait normal.  Psychiatric:        Mood and Affect: Mood normal.        Behavior: Behavior normal.        Thought Content: Thought content normal.        Judgment: Judgment normal.       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 Trudie Reed with 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 Shearon Stalls.   Lauraine Rinne, NP 10/24/2019   This appointment required 23 minutes of patient care (this includes precharting, chart review, review of results, face-to-face care, etc.).

## 2019-10-24 NOTE — Assessment & Plan Note (Addendum)
Plan: Stop Dulera 100 Start Dulera 200 Chest x-ray today

## 2019-10-24 NOTE — Patient Instructions (Addendum)
You were seen today by Coral Ceo, NP  for:   1. ANCA-positive vasculitis (HCC)  - DG Chest 2 View; Future  Continue follow-up with rheumatology  I would recommend that you increase your prednisone to 30 mg daily for the next 5 days, then can work to decrease down to 25 mg for 5 days, I would then hold at 20 mg daily until you receive further evaluation from rheumatology  2. 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)  - DG Chest 2 View; Future  Please stop Dulera 100  We will increase your prescription to:  Dulera 200 >>> 2 puffs in the morning right when you wake up, rinse out your mouth after use, 12 hours later 2 puffs, rinse after use >>> Take this daily, no matter what >>> This is not a rescue inhaler     We recommend today:  Orders Placed This Encounter  Procedures  . DG Chest 2 View    Standing Status:   Future    Standing Expiration Date:   02/24/2020    Order Specific Question:   Reason for Exam (SYMPTOM  OR DIAGNOSIS REQUIRED)    Answer:   doe, EGPA    Order Specific Question:   Preferred imaging location?    Answer:   Internal    Order Specific Question:   Radiology Contrast Protocol - do NOT remove file path    Answer:   [email or web address]   Orders Placed This Encounter  Procedures  . DG Chest 2 View   Meds ordered this encounter  Medications  . mometasone-formoterol (DULERA) 200-5 MCG/ACT inhaler 2 puff    Follow Up:    Return in about 2 months (around 12/25/2019), or if symptoms worsen or fail to improve, for Follow up with Dr. Celine Mans.   Please do your part to reduce the spread of COVID-19:      Reduce your risk of any infection  and COVID19 by using the similar precautions used for avoiding the common cold or flu:  Marland Kitchen Wash your hands often with soap and warm water for at least 20 seconds.  If soap and water are not readily available, use an alcohol-based hand sanitizer with at least  60% alcohol.  . If coughing or sneezing, cover your mouth and nose by coughing or sneezing into the elbow areas of your shirt or coat, into a tissue or into your sleeve (not your hands). Drinda Butts A MASK when in public  . Avoid shaking hands with others and consider head nods or verbal greetings only. . Avoid touching your eyes, nose, or mouth with unwashed hands.  . Avoid close contact with people who are sick. . Avoid places or events with large numbers of people in one location, like concerts or sporting events. . If you have some symptoms but not all symptoms, continue to monitor at home and seek medical attention if your symptoms worsen. . If you are having a medical emergency, call 911.   ADDITIONAL HEALTHCARE OPTIONS FOR PATIENTS  [facility] / e-Visit: [email or web address]         [facility]: [phone]  [facility]: [phone]                   [facility]: [phone]     It is flu season:   >>> Best ways to protect herself from the flu: Receive the yearly flu vaccine, practice good hand hygiene washing with soap and also using  hand sanitizer when available, eat a nutritious meals, get adequate rest, hydrate appropriately   Please contact the office if your symptoms worsen or you have concerns that you are not improving.   Thank you for choosing  [facility] for your healthcare, and for allowing [place] to partner with you on your healthcare journey. I am thankful to be able to provide care to you today.   Wyn Quaker FNP-C

## 2019-10-24 NOTE — Assessment & Plan Note (Addendum)
  This she is managed by Dr. Nickola Major with [facility] rheumatology Lungs clear to auscultation today  Plan: Increase Dulera to [facility] 200 Increase prednisone to 30 mg daily for 5 days, then decrease to 25 mg for 5 days, then hold at 20 mg Keep follow-up with nephrology Keep follow-up with primary care Keep follow-up with rheumatology Continue follow-up with ophthalmology

## 2019-10-24 NOTE — Assessment & Plan Note (Signed)
Plan: Increase prednisone to 30 mg daily for 5 days, then decrease to 25 mg for 5 days, then hold at 20 mg Keep follow-up with nephrology Keep follow-up with primary care Keep follow-up with rheumatology Continue follow-up with ophthalmology

## 2019-10-27 NOTE — Progress Notes (Signed)
Left detailed messaged of chest xray results on voicemail per DPR. Advised to call back if any questions.

## 2019-10-29 ENCOUNTER — Telehealth: Payer: Self-pay | Admitting: Pulmonary Disease

## 2019-10-29 MED ORDER — MOMETASONE FURO-FORMOTEROL FUM 100-5 MCG/ACT IN AERO: 2.0000 | INHALATION_SPRAY | Freq: Two times a day (BID) | RESPIRATORY_TRACT | 5 refills | Status: DC

## 2019-10-30 MED ORDER — MOMETASONE FURO-FORMOTEROL FUM 200-5 MCG/ACT IN AERO: 2.0000 | INHALATION_SPRAY | Freq: Two times a day (BID) | RESPIRATORY_TRACT | 5 refills | Status: DC

## 2019-10-30 NOTE — Telephone Encounter (Signed)
Pt called about this. Pt needs prescription for dulaira 200mg -- it is a new prescription.   Please let pt know when done [phone]

## 2019-10-30 NOTE — Telephone Encounter (Signed)
Pt was seen on 10/24/19 and was prescribed Dulera 200, but it was ordered as a single clinic-administered dose instead of a prescription to pharmacy.  rx has been properly sent.  Pt aware.  Nothing further needed at this time- will close encounter.

## 2019-10-31 DIAGNOSIS — H15013 Anterior scleritis, bilateral: Secondary | ICD-10-CM | POA: Diagnosis not present

## 2019-10-31 DIAGNOSIS — H53042 Amblyopia suspect, left eye: Secondary | ICD-10-CM | POA: Diagnosis not present

## 2019-10-31 DIAGNOSIS — H5000 Unspecified esotropia: Secondary | ICD-10-CM | POA: Diagnosis not present

## 2019-10-31 DIAGNOSIS — H40041 Steroid responder, right eye: Secondary | ICD-10-CM | POA: Diagnosis not present

## 2019-10-31 DIAGNOSIS — Z7952 Long term (current) use of systemic steroids: Secondary | ICD-10-CM | POA: Diagnosis not present

## 2019-11-05 ENCOUNTER — Other Ambulatory Visit: Payer: Self-pay

## 2019-11-05 MED ORDER — MOMETASONE FURO-FORMOTEROL FUM 200-5 MCG/ACT IN AERO: 2.0000 | INHALATION_SPRAY | Freq: Two times a day (BID) | RESPIRATORY_TRACT | 11 refills | Status: DC

## 2019-11-14 ENCOUNTER — Ambulatory Visit (INDEPENDENT_AMBULATORY_CARE_PROVIDER_SITE_OTHER): Payer: BC Managed Care – PPO | Admitting: Psychology

## 2019-11-14 DIAGNOSIS — F4322 Adjustment disorder with anxiety: Secondary | ICD-10-CM

## 2019-11-22 DIAGNOSIS — Z23 Encounter for immunization: Secondary | ICD-10-CM | POA: Diagnosis not present

## 2019-11-28 DIAGNOSIS — H15009 Unspecified scleritis, unspecified eye: Secondary | ICD-10-CM | POA: Diagnosis not present

## 2019-11-28 DIAGNOSIS — M0579 Rheumatoid arthritis with rheumatoid factor of multiple sites without organ or systems involvement: Secondary | ICD-10-CM | POA: Diagnosis not present

## 2019-11-28 DIAGNOSIS — M2550 Pain in unspecified joint: Secondary | ICD-10-CM | POA: Diagnosis not present

## 2019-11-28 DIAGNOSIS — M3130 Wegener's granulomatosis without renal involvement: Secondary | ICD-10-CM | POA: Diagnosis not present

## 2019-12-10 ENCOUNTER — Ambulatory Visit (INDEPENDENT_AMBULATORY_CARE_PROVIDER_SITE_OTHER): Payer: BC Managed Care – PPO | Admitting: Psychology

## 2019-12-10 DIAGNOSIS — F4322 Adjustment disorder with anxiety: Secondary | ICD-10-CM | POA: Diagnosis not present

## 2019-12-15 DIAGNOSIS — M3130 Wegener's granulomatosis without renal involvement: Secondary | ICD-10-CM | POA: Diagnosis not present

## 2019-12-19 ENCOUNTER — Other Ambulatory Visit: Payer: Self-pay | Admitting: Family Medicine

## 2019-12-19 ENCOUNTER — Ambulatory Visit: Payer: BC Managed Care – PPO | Admitting: Internal Medicine

## 2019-12-19 DIAGNOSIS — G5691 Unspecified mononeuropathy of right upper limb: Secondary | ICD-10-CM

## 2019-12-19 DIAGNOSIS — M5090 Cervical disc disorder, unspecified, unspecified cervical region: Secondary | ICD-10-CM

## 2020-01-01 ENCOUNTER — Ambulatory Visit: Payer: BC Managed Care – PPO | Admitting: Internal Medicine

## 2020-01-01 ENCOUNTER — Other Ambulatory Visit: Payer: Self-pay

## 2020-01-01 ENCOUNTER — Encounter: Payer: Self-pay | Admitting: Internal Medicine

## 2020-01-01 VITALS — BP 124/84 | HR 101 | Temp 97.7°F | Wt 285.5 lb

## 2020-01-01 DIAGNOSIS — J310 Chronic rhinitis: Secondary | ICD-10-CM

## 2020-01-01 DIAGNOSIS — Z79899 Other long term (current) drug therapy: Secondary | ICD-10-CM | POA: Diagnosis not present

## 2020-01-01 DIAGNOSIS — J4540 Moderate persistent asthma, uncomplicated: Secondary | ICD-10-CM | POA: Diagnosis not present

## 2020-01-01 DIAGNOSIS — M0579 Rheumatoid arthritis with rheumatoid factor of multiple sites without organ or systems involvement: Secondary | ICD-10-CM | POA: Diagnosis not present

## 2020-01-01 DIAGNOSIS — Z23 Encounter for immunization: Secondary | ICD-10-CM

## 2020-01-01 DIAGNOSIS — M3131 Wegener's granulomatosis with renal involvement: Secondary | ICD-10-CM | POA: Diagnosis not present

## 2020-01-01 MED ORDER — AZELASTINE HCL 0.1 % NA SOLN: 1.0000 | Freq: Two times a day (BID) | NASAL | 12 refills | Status: DC

## 2020-01-01 MED ORDER — FLUTICASONE PROPIONATE 50 MCG/ACT NA SUSP: 2.0000 | Freq: Two times a day (BID) | NASAL | 11 refills | Status: AC

## 2020-01-01 MED ORDER — MOMETASONE FURO-FORMOTEROL FUM 200-5 MCG/ACT IN AERO: 2.0000 | INHALATION_SPRAY | Freq: Two times a day (BID) | RESPIRATORY_TRACT | 11 refills | Status: DC

## 2020-01-01 NOTE — Progress Notes (Signed)
Diane Wells    [id number]    1981/03/16  Primary Care Physician:Copland, Gwenlyn Found, MD Date of Appointment: 01/01/2020 Established Patient Visit  Chief complaint:   Chief Complaint  Patient presents with  . Follow-up    asthma    HPI: Diane Wells is a 39 year old woman with granulomatosis with polyangiitis.  I have detailed her asthma history below.  Her more recent history begins in the last year when she developed arthralgias and neuropathy as well as recurrent sinus issues in early 2020.    GPA history: Dr. Nickola Major at [facility] Rheumatology Dr. Frances Furbish in Neurology Dr. 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 Glenna Fellows. Biopsy deferred due to presumptive diagnosis ultrasound findings, and proteinuria and hematuria.  Interval Updates: Down to 10 mg prednisone. On Dapsone for PCP ppx. On cell cept 500 BID and plan to uptitrate. 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  Anxiety and side effects from prednisone all improved on lower doses.   Breathing improved with increased dose of dulera. No ED visits. Had a little prednisone burst at her last visit with Arlys John.  Did not end up taking nebulized budesonide.   Current Regimen: Dulera 2 puffs BID. Albuterol MDI (allergic to nebulizer solution) using 2-3 times/week.  Asthma Triggers:seasonal and environmental allergies Exacerbations in the last year: 3 History of hospitalization or intubation: none, ED visit in Feb 2021 Hives: anaphylaxis  Allergy Testing: Had SPT testing 2018 years ago: grass, tree, mold, feather, cat dust mites. Wasn't able to keep up with [facility] in the past due to schedules  GERD: denies Allergic Rhinitis: yes ACT:  Asthma Control Test ACT Total Score  05/13/2019 18  02/15/2016 12   FeNO: 24 ppb while on high dose prednisone  I have reviewed the patient's family social and past medical history and updated as  appropriate.   Past Medical History:  Diagnosis Date  . Allergic rhinitis   . Asthma   . Chronic kidney disease   . Granulomatosis with polyangiitis (HCC)   . Hypertension   . Recurrent upper respiratory infection (URI)     Past Surgical History:  Procedure Laterality Date  . ADENOIDECTOMY    . ANTERIOR CERVICAL DECOMP/DISCECTOMY FUSION      Family History  Problem Relation Age of Onset  . Diabetes Mother        Type 1 Diabetes, juvenile onset  . Hypertension Mother   . Pulmonary Hypertension Mother   . Hyperthyroidism Mother   . Glaucoma Mother   . Neuropathy Mother   . Sjogren's syndrome Mother   . Carpal tunnel syndrome Mother   . Hypertension Father   . Gallbladder disease Father   . Hypertension Maternal Grandmother   . Glaucoma Maternal Grandmother   . Coronary artery disease Maternal Grandmother   . Heart attack Maternal Grandmother   . Angina Maternal Grandmother   . Macular degeneration Maternal Grandmother   . COPD Maternal Grandmother   . Congestive Heart Failure Maternal Grandmother   . Leukemia Paternal Grandmother   . Hypertension Paternal Grandmother   . Colon cancer Paternal Grandfather   . Diabetes Maternal Uncle   . Heart attack Maternal Uncle   . Stroke Maternal Uncle   . Glaucoma Maternal Grandfather   . Allergic rhinitis Neg Hx   . Angioedema Neg Hx   . Asthma Neg Hx   . Eczema Neg Hx  Social History   Occupational History  . Not on file  Tobacco Use  . Smoking status: Never Smoker  . Smokeless tobacco: Never Used  Vaping Use  . Vaping Use: Never used  Substance and Sexual Activity  . Alcohol use: No  . Drug use: No  . Sexual activity: Not on file     Physical Exam: Blood pressure 124/84, pulse (!) 101, temperature 97.7 F (36.5 C), temperature source Oral, weight 285 lb 8 oz (129.5 kg), SpO2 97 %.  Gen:      No acute distress ENT:  no nasal polyps, mucus membranes moist Lungs:    No increased respiratory effort,  symmetric chest wall excursion, clear to auscultation bilaterally, no wheezes or crackles CV:         Regular rate and rhythm; no murmurs, rubs, or gallops.  No pedal edema MSK: bruises are improved.   Data Reviewed: Imaging: I have personally reviewed the chest xray Feb 2021 no acute cardiopulmonary process  PFTs:  PFT Results Latest Ref Rng & Units 06/03/2019  FVC-Pre L 3.87  FVC-Predicted Pre % 97  FVC-Post L 3.42  FVC-Predicted Post % 86  Pre FEV1/FVC % % 84  Post FEV1/FCV % % 88  FEV1-Pre L 3.27  FEV1-Predicted Pre % 100  FEV1-Post L 3.00  DLCO uncorrected ml/min/mmHg 18.88  DLCO UNC% % 80  DLCO corrected ml/min/mmHg 20.57  DLCO COR %Predicted % 87  DLVA Predicted % 95  TLC L 5.28  TLC % Predicted % 98  RV % Predicted % 86   I have personally reviewed the patient's PFTs and they are without airflow limitation, have normal lung volumes, and normal diffusion capacity. No significant BD response. Feno is 24ppb which is not elevated.   Labs: Lab Results  Component Value Date   WBC 13.6 (H) 05/23/2019   HGB 11.0 (L) 05/23/2019   HCT 33.5 (L) 05/23/2019   MCV 99.4 05/23/2019   PLT 210 05/23/2019   Lab Results  Component Value Date   NA 135 05/23/2019   K 3.4 (L) 05/23/2019   CL 101 05/23/2019   CO2 25 05/23/2019   Immunization status: Immunization History  Administered Date(s) Administered  . Influenza,inj,Quad PF,6+ Mos 01/10/2016, 05/16/2018, 01/28/2019  . Td 02/24/2016  . Tdap 03/27/2005    Assessment:  Granulomatosis with Polyangiitis Severe Persistent Asthma Allergic Rhinitis Chronic Cough - GERD s worsening rhinitis.  Plan/Recommendations: Continue Dulera 2 puffs BID.  She is doing much better today on higher dose dulera. Would continue current asthma therapy. Continue prednisone taper per rheumatology.  She is on dapsone for PCP prophylaxis and should be on this for as long as she is on dual immune suppresion. Continue cell cept. Continue fluticasone  intra-nasal to help with rhinitis which is chronic. Will add astelin If cough not improved with astelin, would stop and add PPI for reflux to see if that helps.   Return to Care: Return in about 3 months (around 04/02/2020).   Durel Salts, MD Pulmonary and Critical Care Medicine [facility] Office:[phone]

## 2020-01-01 NOTE — Patient Instructions (Addendum)
The patient should have follow up scheduled with myself in 3 months.   Continue flonase. Start astelin nasal spray (same technique as flonase)  If astelin doesn't help your cough, let me know. We can stop it and start treating your reflux.  Flonase - 1 spray on each side of your nose twice a day for first week, then 1 spray on each side.   Instructions for use:  If you also use a saline nasal spray or rinse, use that first.  Position the head with the chin slightly tucked. Use the right hand to spray into the left nostril and the right hand to spray into the left nostril.   Point the bottle away from the septum of your nose (cartilage that divides the two sides of your nose).   Hold the nostril closed on the opposite side from where you will spray  Spray once and gently sniff to pull the medicine into the higher parts of your nose.  Don't sniff too hard as the medicine will drain down the back of your throat instead.  Repeat with a second spray on the same side if prescribed.  Repeat on the other side of your nose.

## 2020-01-07 ENCOUNTER — Ambulatory Visit: Payer: BC Managed Care – PPO | Admitting: Psychology

## 2020-01-09 ENCOUNTER — Ambulatory Visit: Payer: BC Managed Care – PPO | Admitting: Psychology

## 2020-01-12 ENCOUNTER — Ambulatory Visit (INDEPENDENT_AMBULATORY_CARE_PROVIDER_SITE_OTHER): Payer: BC Managed Care – PPO | Admitting: Psychology

## 2020-01-12 DIAGNOSIS — F4322 Adjustment disorder with anxiety: Secondary | ICD-10-CM

## 2020-01-15 DIAGNOSIS — Z79899 Other long term (current) drug therapy: Secondary | ICD-10-CM | POA: Diagnosis not present

## 2020-01-15 DIAGNOSIS — M0579 Rheumatoid arthritis with rheumatoid factor of multiple sites without organ or systems involvement: Secondary | ICD-10-CM | POA: Diagnosis not present

## 2020-01-29 ENCOUNTER — Telehealth: Payer: Self-pay | Admitting: Internal Medicine

## 2020-01-29 MED ORDER — PREDNISONE 10 MG PO TABS: ORAL_TABLET | ORAL | 0 refills | Status: AC

## 2020-01-29 NOTE — Telephone Encounter (Signed)
We can try a burst of prednisone and she can let us know how she's feeling after that. If not feeling better needs appt sooner.

## 2020-01-29 NOTE — Telephone Encounter (Signed)
Called and spoke with pt who stated she has been having a some problems with her breathing. She stated for the past 1-2 weeks, her breathing has been worse, especially this past Monday 11/1. Pt said she has been having to use her rescue inhaler more just about every day now using it 1-2 times every day.  Pt said that she works at a [employer], learning disability and on Monday, 11/1, she began to feel very lightheaded at work and did have to use her rescue inhaler while there. She said that her coworkers also checked her O2 sats and she said they told her she was satting at 88-89% so they did place O2 on her. Pt said that her work wanted to call EMS to have her be taken to the hospital but she refused to have them do that. She said after using her rescue inhaler and after having the O2 on, sats did get up to 95-96% and she also did begin to feel better.  Pt said she is currently on 1000mg  cellcept bid, has been using her Dulera 200 inhaler bid, flonase bid, azelastine bid. She is also currently taking 10mg  prednisone and wonders if she might need a higher dose of the prednisone.,  Pt said when the cellcept was increased from 500mg  to 1000mg , she was doing okay up until 1-2 weeks ago when symptoms started happening.  Pt denies any wheezing or any crackles in lungs. States with her SOB, it is more "huffing and puffing" when she breathes.   Due to the change in her symptoms, pt wants to know what could be recommended. Dr. , please advise.

## 2020-01-29 NOTE — Telephone Encounter (Signed)
Called and spoke with pt letting her know the info stated by Dr. Celine Mans and let her know that Rx was sent to pharmacy for her. Pt verbalized understanding. Nothing further needed.

## 2020-02-02 ENCOUNTER — Ambulatory Visit (INDEPENDENT_AMBULATORY_CARE_PROVIDER_SITE_OTHER): Payer: BC Managed Care – PPO | Admitting: Psychology

## 2020-02-02 DIAGNOSIS — F4322 Adjustment disorder with anxiety: Secondary | ICD-10-CM

## 2020-02-09 DIAGNOSIS — M0579 Rheumatoid arthritis with rheumatoid factor of multiple sites without organ or systems involvement: Secondary | ICD-10-CM | POA: Diagnosis not present

## 2020-02-09 DIAGNOSIS — M310 Hypersensitivity angiitis: Secondary | ICD-10-CM | POA: Diagnosis not present

## 2020-02-09 DIAGNOSIS — M3130 Wegener's granulomatosis without renal involvement: Secondary | ICD-10-CM | POA: Diagnosis not present

## 2020-02-09 DIAGNOSIS — H15009 Unspecified scleritis, unspecified eye: Secondary | ICD-10-CM | POA: Diagnosis not present

## 2020-02-23 DIAGNOSIS — M0579 Rheumatoid arthritis with rheumatoid factor of multiple sites without organ or systems involvement: Secondary | ICD-10-CM | POA: Diagnosis not present

## 2020-02-23 DIAGNOSIS — Z79899 Other long term (current) drug therapy: Secondary | ICD-10-CM | POA: Diagnosis not present

## 2020-03-01 ENCOUNTER — Ambulatory Visit (INDEPENDENT_AMBULATORY_CARE_PROVIDER_SITE_OTHER): Payer: BC Managed Care – PPO | Admitting: Psychology

## 2020-03-01 DIAGNOSIS — F4322 Adjustment disorder with anxiety: Secondary | ICD-10-CM

## 2020-03-22 DIAGNOSIS — Z23 Encounter for immunization: Secondary | ICD-10-CM | POA: Diagnosis not present

## 2020-03-23 DIAGNOSIS — Z20828 Contact with and (suspected) exposure to other viral communicable diseases: Secondary | ICD-10-CM | POA: Diagnosis not present

## 2020-03-23 DIAGNOSIS — Z20822 Contact with and (suspected) exposure to covid-19: Secondary | ICD-10-CM | POA: Diagnosis not present

## 2020-03-27 DIAGNOSIS — Z20828 Contact with and (suspected) exposure to other viral communicable diseases: Secondary | ICD-10-CM | POA: Diagnosis not present

## 2020-03-29 ENCOUNTER — Ambulatory Visit (INDEPENDENT_AMBULATORY_CARE_PROVIDER_SITE_OTHER): Payer: BC Managed Care – PPO | Admitting: Psychology

## 2020-03-29 ENCOUNTER — Telehealth: Payer: Self-pay | Admitting: Family Medicine

## 2020-03-29 DIAGNOSIS — F4322 Adjustment disorder with anxiety: Secondary | ICD-10-CM

## 2020-03-29 NOTE — Telephone Encounter (Signed)
Possible covid positive/ has autoimmune disease  Has stopped taking the meds per dr Lendon Colonel  Wants to know if there is anything she can do for an early treatment?????  Please advise

## 2020-03-30 NOTE — Telephone Encounter (Signed)
Left message to return call 

## 2020-04-26 ENCOUNTER — Ambulatory Visit: Payer: BC Managed Care – PPO | Admitting: Psychology

## 2020-05-05 DIAGNOSIS — N179 Acute kidney failure, unspecified: Secondary | ICD-10-CM | POA: Diagnosis not present

## 2020-05-05 DIAGNOSIS — Z79899 Other long term (current) drug therapy: Secondary | ICD-10-CM | POA: Diagnosis not present

## 2020-05-05 DIAGNOSIS — M3130 Wegener's granulomatosis without renal involvement: Secondary | ICD-10-CM | POA: Diagnosis not present

## 2020-05-05 DIAGNOSIS — I10 Essential (primary) hypertension: Secondary | ICD-10-CM | POA: Diagnosis not present

## 2020-05-05 DIAGNOSIS — R809 Proteinuria, unspecified: Secondary | ICD-10-CM | POA: Diagnosis not present

## 2020-05-05 DIAGNOSIS — M0579 Rheumatoid arthritis with rheumatoid factor of multiple sites without organ or systems involvement: Secondary | ICD-10-CM | POA: Diagnosis not present

## 2020-05-17 DIAGNOSIS — I10 Essential (primary) hypertension: Secondary | ICD-10-CM | POA: Diagnosis not present

## 2020-05-24 ENCOUNTER — Ambulatory Visit (INDEPENDENT_AMBULATORY_CARE_PROVIDER_SITE_OTHER): Payer: BC Managed Care – PPO | Admitting: Psychology

## 2020-05-24 DIAGNOSIS — F4322 Adjustment disorder with anxiety: Secondary | ICD-10-CM

## 2020-05-25 DIAGNOSIS — H15009 Unspecified scleritis, unspecified eye: Secondary | ICD-10-CM | POA: Diagnosis not present

## 2020-05-25 DIAGNOSIS — M310 Hypersensitivity angiitis: Secondary | ICD-10-CM | POA: Diagnosis not present

## 2020-05-25 DIAGNOSIS — M3130 Wegener's granulomatosis without renal involvement: Secondary | ICD-10-CM | POA: Diagnosis not present

## 2020-05-25 DIAGNOSIS — M0579 Rheumatoid arthritis with rheumatoid factor of multiple sites without organ or systems involvement: Secondary | ICD-10-CM | POA: Diagnosis not present

## 2020-06-08 ENCOUNTER — Encounter: Payer: Self-pay | Admitting: Internal Medicine

## 2020-06-08 ENCOUNTER — Other Ambulatory Visit: Payer: Self-pay

## 2020-06-08 ENCOUNTER — Ambulatory Visit: Payer: BC Managed Care – PPO | Admitting: Internal Medicine

## 2020-06-08 ENCOUNTER — Other Ambulatory Visit: Payer: Self-pay | Admitting: Internal Medicine

## 2020-06-08 VITALS — BP 138/84 | HR 96 | Temp 97.3°F | Ht 66.0 in | Wt 300.8 lb

## 2020-06-08 DIAGNOSIS — I7782 Antineutrophilic cytoplasmic antibody (ANCA) vasculitis: Secondary | ICD-10-CM

## 2020-06-08 DIAGNOSIS — I776 Arteritis, unspecified: Secondary | ICD-10-CM | POA: Diagnosis not present

## 2020-06-08 DIAGNOSIS — J4550 Severe persistent asthma, uncomplicated: Secondary | ICD-10-CM

## 2020-06-08 DIAGNOSIS — K219 Gastro-esophageal reflux disease without esophagitis: Secondary | ICD-10-CM | POA: Diagnosis not present

## 2020-06-08 MED ORDER — BUDESONIDE 0.25 MG/2ML IN SUSP: 0.2500 mg | Freq: Two times a day (BID) | RESPIRATORY_TRACT | 12 refills | Status: DC

## 2020-06-08 MED ORDER — PANTOPRAZOLE SODIUM 40 MG PO TBEC: 40.0000 mg | DELAYED_RELEASE_TABLET | Freq: Every day | ORAL | 5 refills | Status: DC

## 2020-06-08 NOTE — Patient Instructions (Signed)
The patient should have follow up scheduled with myself in 3 months.   Prior to next visit patient should have: Spirometry - 30 minutes next available.   Start taking budesonide nebulizers in addition to the advair. Gargle after use.

## 2020-06-08 NOTE — Progress Notes (Signed)
Mandisa Persinger    [id number]    04/21/1980  Primary Care Physician:Copland, Gwenlyn Found, MD Date of Appointment: 06/08/2020 Established Patient Visit  Chief complaint:   Chief Complaint  Patient presents with  . 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  I have detailed her asthma history below.  Her more recent history begins in the last year when she developed arthralgias and neuropathy as well as recurrent sinus issues in early 2020.    GPA history: Dr. Nickola Major at [facility] Dr. Frances Furbish in Neurology Dr. 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 Glenna Fellows. Biopsy deferred due to presumptive diagnosis ultrasound findings, and proteinuria and hematuria.  Interval Updates: She did require a burst of prednisone in Nov 2021 and that did make her feel better (on that for a week). She is tapering down with Dr. Nickola Major office and is down to 7.5 mg daily. Up to 1500 mg BID on cell cept. Subjectively feels like breathing is worse on less prednisone  Breathing improved with increased dose of dulera. Never tried inhaled budesonide.   Current Regimen: Dulera 2 puffs BID. Albuterol MDI (allergic to nebulizer solution) using 2-3 times/week.  Asthma Triggers:seasonal and environmental allergies Exacerbations in the last year: 4 History of hospitalization or intubation: none, ED visit in Feb 2021 Hives: anaphylaxis  Allergy Testing: Had SPT testing 2018 years ago: grass, tree, mold, feather, cat dust mites. Wasn't able to keep up with [facility] in the past due to schedules  GERD: denies Allergic Rhinitis: yes ACT:  Asthma Control Test ACT Total Score  05/13/2019 18  02/15/2016 12   FeNO: 24 ppb while on high dose prednisone  I have reviewed the patient's family social and past medical history and updated as  appropriate.   Past Medical History:  Diagnosis Date  . Allergic rhinitis   . Asthma   . Chronic kidney disease   . Granulomatosis with polyangiitis (HCC)   . Hypertension   . Recurrent upper respiratory infection (URI)     Past Surgical History:  Procedure Laterality Date  . ADENOIDECTOMY    . ANTERIOR CERVICAL DECOMP/DISCECTOMY FUSION      Family History  Problem Relation Age of Onset  . Diabetes Mother        Type 1 Diabetes, juvenile onset  . Hypertension Mother   . Pulmonary Hypertension Mother   . Hyperthyroidism Mother   . Glaucoma Mother   . Neuropathy Mother   . Sjogren's syndrome Mother   . Carpal tunnel syndrome Mother   . Hypertension Father   . Gallbladder disease Father   . Hypertension Maternal Grandmother   . Glaucoma Maternal Grandmother   . Coronary artery disease Maternal Grandmother   . Heart attack Maternal Grandmother   . Angina Maternal Grandmother   . Macular degeneration Maternal Grandmother   . COPD Maternal Grandmother   . Congestive Heart Failure Maternal Grandmother   . Leukemia Paternal Grandmother   . Hypertension Paternal Grandmother   . Colon cancer Paternal Grandfather   . Diabetes Maternal Uncle   . Heart attack Maternal Uncle   . Stroke Maternal Uncle   . Glaucoma Maternal Grandfather   . Allergic rhinitis Neg Hx   . Angioedema Neg Hx   . Asthma Neg Hx   . Eczema Neg Hx  Social History   Occupational History  . Not on file  Tobacco Use  . Smoking status: Never Smoker  . Smokeless tobacco: Never Used  Vaping Use  . Vaping Use: Never used  Substance and Sexual Activity  . Alcohol use: No  . Drug use: No  . Sexual activity: Not on file     Physical Exam: Blood pressure 138/84, pulse 96, temperature (!) 97.3 F (36.3 C), temperature source Temporal, height 5\' 6"  (1.676 m), weight (!) 300 lb 12.8 oz (136.4 kg), SpO2 96 %.  Gen:      No acute distress ENT:   Mmm no nasal polyps Lungs:    No increased  respiratory effort, symmetric chest wall excursion, clear to auscultation bilaterally, no wheezes or crackles CV:         RRR, no mrg MSK:     No rashes  Data Reviewed: Imaging: I have personally reviewed the chest xray July 2021 no acute cardiopulmonary process  PFTs:  PFT Results Latest Ref Rng & Units 06/03/2019  FVC-Pre L 3.87  FVC-Predicted Pre % 97  FVC-Post L 3.42  FVC-Predicted Post % 86  Pre FEV1/FVC % % 84  Post FEV1/FCV % % 88  FEV1-Pre L 3.27  FEV1-Predicted Pre % 100  FEV1-Post L 3.00  DLCO uncorrected ml/min/mmHg 18.88  DLCO UNC% % 80  DLCO corrected ml/min/mmHg 20.57  DLCO COR %Predicted % 87  DLVA Predicted % 95  TLC L 5.28  TLC % Predicted % 98  RV % Predicted % 86   I have personally reviewed the patient's PFTs and they are without airflow limitation, have normal lung volumes, and normal diffusion capacity. No significant BD response. Feno is 24ppb which is not elevated.   Labs: Lab Results  Component Value Date   WBC 13.6 (H) 05/23/2019   HGB 11.0 (L) 05/23/2019   HCT 33.5 (L) 05/23/2019   MCV 99.4 05/23/2019   PLT 210 05/23/2019   Lab Results  Component Value Date   NA 135 05/23/2019   K 3.4 (L) 05/23/2019   CL 101 05/23/2019   CO2 25 05/23/2019   Cr 2/25 shows Cr 1.03.   Immunization status: Immunization History  Administered Date(s) Administered  . Influenza,inj,Quad PF,6+ Mos 01/10/2016, 05/16/2018, 01/28/2019, 01/01/2020  . Moderna Sars-Covid-2 Vaccination 11/22/2019, 12/09/2019  . PFIZER Comirnaty(Gray Top)Covid-19 Tri-Sucrose Vaccine 03/22/2020  . Td 02/24/2016  . 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  Plan/Recommendations: Continue Dulera 2 puffs BID.  She is more short of breath as she tapers prednisone. Will obtain pfts before next visit. Would add budesonide nebulizer to add to the dulera. Continue  prednisone taper per rheumatology.  She is on dapsone for PCP prophylaxis and should be on this for as long as she is on dual immune suppresion. Continue cell cept. Continue fluticasone and astelin Will add a PPI for cough today   Return to Care: Return in about 3 months (around 09/08/2020).   09/10/2020, MD Pulmonary and Critical Care Medicine [facility] [phone]

## 2020-06-09 NOTE — Telephone Encounter (Signed)
Changes Requested   Name from pharmacy: PANTOPRAZOLE SOD DR 40 MG TAB       Will file in chart as: pantoprazole (PROTONIX) 40 MG tablet   Sig: TAKE 1 TABLET BY MOUTH EVERY DAY   Disp:  30 tablet  Refills:  5   Start: 06/08/2020   Class: Normal   Non-formulary For: Severe persistent asthma without complication   Last ordered: Yesterday by Charlott Holler, MD Last refill: 06/08/2020   Rx #: [id number]   Pharmacy comment: Alternative Requested:REQUIRES PRIOR AUTHORIZATION.     To be filled at: [id number] - [place], [place] - [address]       Called and spoke with pt about message received from pharmacy about the pantoprazole Rx. Asked pt if she has tried any OTC acid reflux meds and she said that she has tried pepcid and tums. Stated to pt that we would try to do the PA for the pantoprazole and based off of the outcome we would send to Dr. Celine Mans if needed to have a med change and she verbalized understanding.  PA request was received from [facility]pharmacy[facility] [place], [place] [phone] Fax: [phone] Medication name and strength: Pantoprazole 40mg  Ordering Provider:  Was PA started with [facility]?: yes If yes, please enter KEY:  [id number] Medication tried and failed: pepcid, tums, prevacid  PA sent to plan, time frame for approval / denial: Your information has been submitted to [facility]. [employer] Deputy will review the request and notify you of the determination decision directly, typically within 72 hours of receiving all information.  You will also receive your request decision electronically. To check for an update later, open this request again from your dashboard.  Holding in refills pool while we await status of PA.

## 2020-06-11 NOTE — Telephone Encounter (Signed)
Checked status of PA on [email or web address], and PA has been denied. No rationale for denial given on website, but a letter outlining denial and appeal process has been faxed to our office. Will await fax to see how to proceed with next steps.

## 2020-06-14 ENCOUNTER — Telehealth: Payer: Self-pay | Admitting: Adult Health

## 2020-06-14 ENCOUNTER — Other Ambulatory Visit (HOSPITAL_COMMUNITY)
Admission: RE | Admit: 2020-06-14 | Discharge: 2020-06-14 | Disposition: A | Discharge status: Home / Self Care | Payer: BC Managed Care – PPO | Source: Ambulatory Visit | Attending: Internal Medicine | Admitting: Internal Medicine

## 2020-06-14 DIAGNOSIS — Z01812 Encounter for preprocedural laboratory examination: Secondary | ICD-10-CM | POA: Insufficient documentation

## 2020-06-14 DIAGNOSIS — Z20822 Contact with and (suspected) exposure to covid-19: Secondary | ICD-10-CM | POA: Insufficient documentation

## 2020-06-14 NOTE — Telephone Encounter (Signed)
I called patient to discuss Evusheld, a long acting monoclonal antibody injection administered to patients with decreased immune systems or intolerance/allergy to the COVID 19 vaccine as COVID19 prevention.    Unable to reach patient.  LMOM to return my call.  Kajsa Butrum, NP  

## 2020-06-15 ENCOUNTER — Other Ambulatory Visit: Payer: Self-pay | Admitting: Internal Medicine

## 2020-06-15 ENCOUNTER — Other Ambulatory Visit: Payer: Self-pay | Admitting: Adult Health

## 2020-06-15 DIAGNOSIS — I776 Arteritis, unspecified: Secondary | ICD-10-CM

## 2020-06-15 DIAGNOSIS — I7782 Antineutrophilic cytoplasmic antibody (ANCA) vasculitis: Secondary | ICD-10-CM

## 2020-06-15 DIAGNOSIS — J4550 Severe persistent asthma, uncomplicated: Secondary | ICD-10-CM

## 2020-06-15 LAB — SARS CORONAVIRUS 2 (TAT 6-24 HRS): SARS Coronavirus 2: NEGATIVE

## 2020-06-15 NOTE — Progress Notes (Signed)
I connected by phone with Diane Wells on 06/15/2020, 9:42 AM to discuss the potential use of a new treatment, tixagevimab/cilgavimab, for pre-exposure prophylaxis for prevention of coronavirus disease 2019 (COVID-19) caused by the SARS-CoV-2 virus.  This patient is a 40 y.o. female that meets the FDA criteria for Emergency Use Authorization of tixagevimab/cilgavimab for pre-exposure prophylaxis of COVID-19 disease. Pt meets following criteria:  Age >12 yr and weight > 40kg  Not currently infected with SARS-CoV-2 and has no known recent exposure to an individual infected with SARS-CoV-2 AND o Who has moderate to severe immune compromise due to a medical condition or receipt of immunosuppressive medications or treatments and may not mount an adequate immune response to COVID-19 vaccination or  o Vaccination with any available COVID-19 vaccine, according to the approved or authorized schedule, is not recommended due to a history of severe adverse reaction (e.g., severe allergic reaction) to a COVID-19 vaccine(s) and/or COVID-19 vaccine component(s).  o Patient meets the following definition of mod-severe immune compromised status: 1. Received B-cell depleting therapies (e.g. rituximab, obinutuzumab, ocrelizumab, alemtuzumab) within last 6 months & age > or = 18  I have spoken and communicated the following to the patient or parent/caregiver regarding COVID monoclonal antibody treatment:  1. FDA has authorized the emergency use of tixagevimab/cilgavimab for the pre-exposure prophylaxis of COVID-19 in patients with moderate-severe immunocompromised status, who meet above EUA criteria.  2. The significant known and potential risks and benefits of COVID monoclonal antibody, and the extent to which such potential risks and benefits are unknown.  3. Information on available alternative treatments and the risks and benefits of those alternatives, including clinical trials.  4. The patient or  parent/caregiver has the option to accept or refuse COVID monoclonal antibody treatment.  After reviewing this information with the patient, agree to receive tixagevimab/cilgavimab.  I have reached out to [facility] for an appointment and I am awaiting a call back.    Noreene Filbert, NP, 06/15/2020, 9:42 AM

## 2020-06-15 NOTE — Progress Notes (Signed)
p 

## 2020-06-15 NOTE — Telephone Encounter (Signed)
PA for Pantoprazole has been Denied. Ant other recommendations for this patient?

## 2020-06-16 ENCOUNTER — Telehealth: Payer: Self-pay | Admitting: Adult Health

## 2020-06-16 ENCOUNTER — Ambulatory Visit (INDEPENDENT_AMBULATORY_CARE_PROVIDER_SITE_OTHER): Payer: BC Managed Care – PPO | Admitting: Internal Medicine

## 2020-06-16 ENCOUNTER — Other Ambulatory Visit: Payer: Self-pay

## 2020-06-16 DIAGNOSIS — J4550 Severe persistent asthma, uncomplicated: Secondary | ICD-10-CM | POA: Diagnosis not present

## 2020-06-16 LAB — PULMONARY FUNCTION TEST
FEF 25-75 Post: 2.52 L/sec
FEF 25-75 Pre: 3.64 L/sec
FEF2575-%Change-Post: -30 %
FEF2575-%Pred-Post: 76 %
FEF2575-%Pred-Pre: 110 %
FEV1-%Change-Post: -9 %
FEV1-%Pred-Post: 82 %
FEV1-%Pred-Pre: 90 %
FEV1-Post: 2.65 L
FEV1-Pre: 2.92 L
FEV1FVC-%Change-Post: 5 %
FEV1FVC-%Pred-Pre: 104 %
FEV6-%Change-Post: -13 %
FEV6-%Pred-Post: 75 %
FEV6-%Pred-Pre: 86 %
FEV6-Post: 2.92 L
FEV6-Pre: 3.38 L
FEV6FVC-%Change-Post: 0 %
FEV6FVC-%Pred-Post: 101 %
FEV6FVC-%Pred-Pre: 101 %
FVC-%Change-Post: -13 %
FVC-%Pred-Post: 73 %
FVC-%Pred-Pre: 85 %
FVC-Post: 2.92 L
FVC-Pre: 3.38 L
Post FEV1/FVC ratio: 91 %
Post FEV6/FVC ratio: 100 %
Pre FEV1/FVC ratio: 86 %
Pre FEV6/FVC Ratio: 100 %

## 2020-06-16 NOTE — Progress Notes (Signed)
Spirometry pre and post completed today 

## 2020-06-16 NOTE — Telephone Encounter (Signed)
Called and secured patient appointment on 06/24/2020 for Evusheld injection.  Patient is aware of appointment.    Diane Wells

## 2020-06-16 NOTE — Telephone Encounter (Signed)
What is the formulary alternative?

## 2020-06-17 NOTE — Telephone Encounter (Signed)
Called and spoke with pt letting her know that Dr. Celine Mans sent omeprazole to pharmacy in place of the pantoprazole and she verbalized understanding. Nothing further needed.

## 2020-06-17 NOTE — Telephone Encounter (Signed)
Fax was received. It states that pantoprazole 40mg  could be covered when two OTC generic proton pump inhibitors such as omeprazole or lansoprazole have been tried/failed.

## 2020-06-21 ENCOUNTER — Ambulatory Visit: Payer: BC Managed Care – PPO | Admitting: Psychology

## 2020-06-22 DIAGNOSIS — E242 Drug-induced Cushing's syndrome: Secondary | ICD-10-CM | POA: Diagnosis not present

## 2020-06-23 DIAGNOSIS — M3130 Wegener's granulomatosis without renal involvement: Secondary | ICD-10-CM | POA: Diagnosis not present

## 2020-06-24 ENCOUNTER — Ambulatory Visit (HOSPITAL_COMMUNITY)
Admission: RE | Admit: 2020-06-24 | Discharge: 2020-06-24 | Disposition: A | Discharge status: Home / Self Care | Payer: BC Managed Care – PPO | Source: Ambulatory Visit | Attending: Pulmonary Disease | Admitting: Pulmonary Disease

## 2020-06-24 ENCOUNTER — Other Ambulatory Visit: Payer: Self-pay

## 2020-06-24 DIAGNOSIS — I7782 Antineutrophilic cytoplasmic antibody (ANCA) vasculitis: Secondary | ICD-10-CM

## 2020-06-24 DIAGNOSIS — I776 Arteritis, unspecified: Secondary | ICD-10-CM | POA: Insufficient documentation

## 2020-06-24 MED ORDER — EPINEPHRINE 0.3 MG/0.3ML IJ SOAJ: 0.3000 mg | Freq: Once | INTRAMUSCULAR | Status: DC | PRN

## 2020-06-24 MED ORDER — METHYLPREDNISOLONE SODIUM SUCC 125 MG IJ SOLR: 125.0000 mg | Freq: Once | INTRAMUSCULAR | Status: DC | PRN

## 2020-06-24 MED ORDER — TIXAGEVIMAB (PART OF EVUSHELD) INJECTION
300.0000 mg | Freq: Once | INTRAMUSCULAR | Status: AC
  Administered 2020-06-24: 300 mg via INTRAMUSCULAR

## 2020-06-24 MED ORDER — FAMOTIDINE IN NACL 20-0.9 MG/50ML-% IV SOLN: 20.0000 mg | Freq: Once | INTRAVENOUS | Status: DC | PRN

## 2020-06-24 MED ORDER — ALBUTEROL SULFATE HFA 108 (90 BASE) MCG/ACT IN AERS: 2.0000 | INHALATION_SPRAY | Freq: Once | RESPIRATORY_TRACT | Status: DC | PRN

## 2020-06-24 MED ORDER — DIPHENHYDRAMINE HCL 50 MG/ML IJ SOLN: 50.0000 mg | Freq: Once | INTRAMUSCULAR | Status: DC | PRN

## 2020-06-24 MED ORDER — CILGAVIMAB (PART OF EVUSHELD) INJECTION: 300.0000 mg | Freq: Once | INTRAMUSCULAR | Status: DC

## 2020-07-02 DIAGNOSIS — Z7952 Long term (current) use of systemic steroids: Secondary | ICD-10-CM | POA: Diagnosis not present

## 2020-07-02 DIAGNOSIS — H53042 Amblyopia suspect, left eye: Secondary | ICD-10-CM | POA: Diagnosis not present

## 2020-07-02 DIAGNOSIS — H5000 Unspecified esotropia: Secondary | ICD-10-CM | POA: Diagnosis not present

## 2020-07-02 DIAGNOSIS — H15013 Anterior scleritis, bilateral: Secondary | ICD-10-CM | POA: Diagnosis not present

## 2020-07-06 DIAGNOSIS — E242 Drug-induced Cushing's syndrome: Secondary | ICD-10-CM | POA: Diagnosis not present

## 2020-07-07 ENCOUNTER — Other Ambulatory Visit: Payer: Self-pay | Admitting: Internal Medicine

## 2020-07-07 DIAGNOSIS — J4550 Severe persistent asthma, uncomplicated: Secondary | ICD-10-CM

## 2020-07-07 NOTE — Telephone Encounter (Signed)
ND please advise.  The insurance will not cover the omeprazole 40 BID.  Please advise. Thanks

## 2020-07-12 ENCOUNTER — Ambulatory Visit (INDEPENDENT_AMBULATORY_CARE_PROVIDER_SITE_OTHER): Payer: BC Managed Care – PPO | Admitting: Psychology

## 2020-07-12 DIAGNOSIS — F4322 Adjustment disorder with anxiety: Secondary | ICD-10-CM | POA: Diagnosis not present

## 2020-07-12 NOTE — Telephone Encounter (Signed)
Called and spoke with patient, she states the she has not had her Dulera-200 in 2 weeks, her pharmacy said it is on back order and has not been able to order it n 3 weeks and does not know when they will have it.  She said she is willing to go back on the Dulera-100, she was on this previously and was doing well on it until the summer.  She said all the inhalers are covered about the same, she named Symbicort and Breo.  She stated the Dr. Celine Mans preferred [facility] for her.  Advised I would get this message to Dr. Celine Mans.  She also requested that we change her Omeprazole 40mg  to bedtime only, her insurance will not cover twice daily.  Dr. , Please advise regarding Dulera-100 and Omeprazole 40 mg at bedtime.  Thank you.

## 2020-07-13 MED ORDER — BUDESONIDE-FORMOTEROL FUMARATE 160-4.5 MCG/ACT IN AERO: 2.0000 | INHALATION_SPRAY | Freq: Two times a day (BID) | RESPIRATORY_TRACT | 12 refills | Status: DC

## 2020-07-13 NOTE — Telephone Encounter (Signed)
Called and spoke with pt letting her know that ND sent Rx for Symbicort to pharmacy for her as alternate to [facility] with [facility] being on backorder and also stated to her that omeprazole was sent with instructions to take one by mouth daily due to insurance not covering the twice daily. Pt verbalized understanding. Nothing further needed.

## 2020-07-13 NOTE — Addendum Note (Signed)
Addended by: Durel Salts on: 07/13/2020 08:52 AM   Modules accepted: Orders

## 2020-07-13 NOTE — Telephone Encounter (Signed)
symbicort sent to pharmacy as alternative.

## 2020-07-13 NOTE — Telephone Encounter (Signed)
Omeprazole refilled.

## 2020-07-23 DIAGNOSIS — E242 Drug-induced Cushing's syndrome: Secondary | ICD-10-CM | POA: Diagnosis not present

## 2020-07-23 DIAGNOSIS — R112 Nausea with vomiting, unspecified: Secondary | ICD-10-CM | POA: Diagnosis not present

## 2020-07-23 DIAGNOSIS — E2749 Other adrenocortical insufficiency: Secondary | ICD-10-CM | POA: Diagnosis not present

## 2020-08-11 DIAGNOSIS — M2550 Pain in unspecified joint: Secondary | ICD-10-CM | POA: Diagnosis not present

## 2020-08-11 DIAGNOSIS — M3130 Wegener's granulomatosis without renal involvement: Secondary | ICD-10-CM | POA: Diagnosis not present

## 2020-08-11 DIAGNOSIS — H15009 Unspecified scleritis, unspecified eye: Secondary | ICD-10-CM | POA: Diagnosis not present

## 2020-08-11 DIAGNOSIS — M0579 Rheumatoid arthritis with rheumatoid factor of multiple sites without organ or systems involvement: Secondary | ICD-10-CM | POA: Diagnosis not present

## 2020-08-24 DIAGNOSIS — E2749 Other adrenocortical insufficiency: Secondary | ICD-10-CM | POA: Diagnosis not present

## 2020-08-24 DIAGNOSIS — E242 Drug-induced Cushing's syndrome: Secondary | ICD-10-CM | POA: Diagnosis not present

## 2020-08-30 ENCOUNTER — Ambulatory Visit (INDEPENDENT_AMBULATORY_CARE_PROVIDER_SITE_OTHER): Payer: BC Managed Care – PPO | Admitting: Psychology

## 2020-08-30 DIAGNOSIS — F4322 Adjustment disorder with anxiety: Secondary | ICD-10-CM | POA: Diagnosis not present

## 2020-09-17 ENCOUNTER — Telehealth: Payer: Self-pay | Admitting: Internal Medicine

## 2020-09-17 MED ORDER — PAXLOVID 20 X 150 MG & 10 X 100MG PO TBPK: 3.0000 | ORAL_TABLET | Freq: Two times a day (BID) | ORAL | 0 refills | Status: AC

## 2020-09-17 NOTE — Telephone Encounter (Signed)
Called and spoke with patient. She stated that she had bloodwork done at Dr. Shawnee Knapp office last month and was told that her labs were normal. She will take a picture of the bloodwork and send it via MyChart. Once we have received the bloodwork, I will send the message back to [facility].

## 2020-09-17 NOTE — Telephone Encounter (Signed)
Called and spoke with patient. She is aware that [facility] has sent in the Paxlovid for her. She is aware of the side effects as well as the instructions.   Nothing further needed at time of call.

## 2020-09-17 NOTE — Telephone Encounter (Signed)
Called spoke with patient. She said her symptoms started last night they are a sore throat, mild cough, 99.0 temp, and congestion. She would like something to help her and she was asking for a televisit but there is no openings today and I explained that to her.   Dr. Celine Mans is off so sending to Dr. Judeth Horn for further recommendations

## 2020-09-17 NOTE — Telephone Encounter (Signed)
Suspect would benefit from paxlovid BID x 5 days. Dose depends on renal function. Big jump in baseline Cr from 202 to 201. Need STAT BMP to decide dosing. Once result is back please alert so can prescribe appropriate dose. Went through FDA checklist. She is on prednisone. Paxlovid can cause increased serum concentrations of prednisone. Advise she contact us if she notices increase frequency of urination, nausea, vomiting, psychosis or altered mental status, insomnia.

## 2020-09-17 NOTE — Telephone Encounter (Signed)
Labs reviewed 08/11/20. GFR > 60. Paxlovid BID x 5 days sent.

## 2020-10-01 DIAGNOSIS — E242 Drug-induced Cushing's syndrome: Secondary | ICD-10-CM | POA: Diagnosis not present

## 2020-10-01 DIAGNOSIS — E2749 Other adrenocortical insufficiency: Secondary | ICD-10-CM | POA: Diagnosis not present

## 2020-10-14 NOTE — Progress Notes (Addendum)
[facility] [address], [address] [place], [place] [address] [phone]  Date:  10/20/2020   Name:  Diane Wells   DOB:  10-25-1980   MRN:  [id number]  PCP:  Pearline Cables, MD    Chief Complaint: Medical Management of Chronic Issues (Medical concerns with health. Catch her up with ongoing health concerns. Seeing specialist )   History of Present Illness:  Diane Wells is a 40 y.o. 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 which has been incredibly complicated and affected multiple organ systems She has renal, cutaneous, potentially pulmonary involvement of her disease  She was getting infusions of rituximab but had anaphylaxis so this had to be stopped - they could not control her with pre-meds such as Benadryl This unfortunately eliminated other infusion meds also   She is now taking cell-cept  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, they tried to stop her pred but she got adreanl insuf so she was started on oral hydrocortisone Current dose is 10 am and 5 pm  Her steroids have caused weight gain, she has put on about 60 pounds in a year  She notes she is getting more migraine HA since she has decreased prednisone She would like a refill of her butalbital for as needed use Headaches may occur 4x a week She does not get aura She may have dizziness, light sensitivity The pain will start in her neck and shoulders and then go into headache She may have vomiting   She notes that her neck and upper back hurt- she thinks this may be due to weight gain in this area due to steroids but she did have a cervical fusion in 2016 She notes that she can feel/ hear a crunching in her neck when she moves her neck No  neuro sx such as numbness or weakness in her arms She needs a refill of her methocarbamol that she uses as needed for muscle pain.  She also uses gabapentin generally no more than once daily  She notes that her heart rate is higher- may be 85 at rest but even mild activity will put her up to 110- 120.  This has been getting gradually worse the last 4 months or so  No chest pain or shortness of breath, but she is concerned about this tachycardia She did have an echo about a year ago ordered per her pulmonologist-normal  She is taking lasix 20 once a day only for her LE swelling.  However, she notes that she oftentimes will still have bothersome and uncomfortable swelling of her legs.  If she is able to elevate her legs it is better  Rheumatology- Dr Nickola Major with [facility].  Last visit in May  Endocrinology- Sharl Ma at [facility]   Neurology- seen in February, she saw Dr Anne Hahn previously for neuropathy but never for headache Ophthalmology- at [facility] eye Dr Sherrine Maples  Nephrology-  Dr Glenna Fellows at [facility], most recent visit in February Pulmonology - seen in March.  Dr Celine Mans  Pt notes that her home BP is about 130/80 if resting but will go up with any exercise  She is currently taking losartan 50 mg  She also notes stress and urge urinary incontinence.  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   Medication management 05/27/2019   Patellar dislocation 01/17/2018   Acute left ankle pain 11/27/2017   Allergic reaction 02/15/2016   Moderate persistent asthma without complication 02/15/2016   Perennial and seasonal allergic rhinitis 02/15/2016   Allergic conjunctivitis 02/15/2016   Obesity, unspecified obesity  severity, unspecified obesity type 01/10/2016    Past Medical History:  Diagnosis Date   Allergic rhinitis    Asthma    Chronic kidney disease    Granulomatosis with polyangiitis (HCC)    Hypertension    Recurrent upper respiratory infection (URI)     Past Surgical History:  Procedure Laterality Date   ADENOIDECTOMY     ANTERIOR CERVICAL DECOMP/DISCECTOMY FUSION      Social History   Tobacco Use   Smoking status: Never   Smokeless tobacco: Never  Vaping Use   Vaping Use: Never used  Substance Use Topics   Alcohol use: No   Drug use: No    Family History  Problem Relation Age of Onset   Diabetes Mother        Type 1 Diabetes, juvenile onset   Hypertension Mother    Pulmonary Hypertension Mother    Hyperthyroidism Mother    Glaucoma Mother    Neuropathy Mother    Sjogren's syndrome Mother    Carpal tunnel syndrome Mother    Hypertension Father    Gallbladder disease Father    Hypertension Maternal Grandmother    Glaucoma Maternal Grandmother    Coronary artery disease Maternal Grandmother    Heart attack Maternal Grandmother    Angina Maternal Grandmother    Macular degeneration Maternal Grandmother    COPD Maternal Grandmother    Congestive Heart Failure Maternal Grandmother    Leukemia Paternal Grandmother    Hypertension Paternal Grandmother    Colon cancer Paternal Grandfather    Diabetes Maternal Uncle    Heart attack Maternal Uncle    Stroke Maternal Uncle    Glaucoma Maternal Grandfather    Allergic rhinitis Neg Hx    Angioedema Neg Hx    Asthma Neg Hx    Eczema Neg Hx     Allergies  Allergen Reactions   Albuterol Other (See Comments) and Shortness Of Breath   Other Anaphylaxis    Essential Oil, scent unknown, caused tongue swelling   Truxima [Rituximab] Anaphylaxis   Penicillins Hives   Sulfa Antibiotics     Medication list has been reviewed and updated.  Current Outpatient Medications on File Prior to Visit  Medication Sig Dispense  Refill   azelastine (ASTELIN) 0.1 % nasal spray Place 1 spray into both nostrils 2 (two) times daily. Use in each nostril as directed 30 mL 12   budesonide (PULMICORT) 0.25 MG/2ML nebulizer solution Take 2 mLs (0.25 mg total) by nebulization in the morning and at bedtime. 60 mL 12   budesonide-formoterol (SYMBICORT) 160-4.5 MCG/ACT inhaler Inhale 2 puffs into the lungs in the morning and at bedtime. 1 each 12   fluticasone (FLONASE) 50 MCG/ACT nasal spray Place 2 sprays into both nostrils in the morning and at bedtime. 16 g 11   mycophenolate (CELLCEPT) 500 MG tablet Take 1,500 mg by mouth 2 (two) times daily.     prednisoLONE acetate (PRED  FORTE) 1 % ophthalmic suspension 1 drop 3 (three) times daily.     PROAIR HFA 108 (90 Base) MCG/ACT inhaler Inhale 1-2 puffs into the lungs daily as needed for shortness of breath. 18 g 5   hydrocortisone (CORTEF) 10 MG tablet 10 mg Am and 5 mg PM.     No current facility-administered medications on file prior to visit.    Review of Systems:  As per HPI- otherwise negative.   Physical Examination: Vitals:   10/20/20 1300  BP: (!) 162/104  Pulse: (!) 104  Temp: 98.2 F (36.8 C)  SpO2: 99%   Vitals:   10/20/20 1300  Weight: (!) 310 lb 6.4 oz (140.8 kg)  Height: 5\' 6"  (1.676 m)   Body mass index is 50.1 kg/m. Ideal Body Weight: Weight in (lb) to have BMI = 25: 154.6  GEN: no acute distress.  Obese, otherwise looks well Normal cervical spine range of motion Bilateral TM wnl, oropharynx normal.  PEERL,EOMI.   HEENT: Atraumatic, Normocephalic. Bilateral TM wnl, oropharynx normal.  PEERL,EOMI.   Ears and Nose: No external deformity. CV: RRR, No M/G/R. No JVD. No thrill. No extra heart sounds. PULM: CTA B, no wheezes, crackles, rhonchi. No retractions. No resp. distress. No accessory muscle use. ABD: S, NT, ND, +BS. No rebound. No HSM. EXTR: No c/c/e PSYCH: Normally interactive. Conversant.  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)  History of fusion of cervical spine - Plan: DG Cervical Spine Complete  Current chronic use of systemic steroids - Plan: DG Bone Density  Upper back pain - Plan: DG Thoracic Spine 2 View  Essential hypertension - Plan: losartan (COZAAR) 25 MG tablet  Migraine without aura and without status migrainosus, not intractable - Plan: butalbital-acetaminophen-caffeine (FIORICET) 50-325-40 MG tablet, ondansetron (ZOFRAN) 8 MG tablet  Cervical disc disease - Plan: methocarbamol (ROBAXIN) 750 MG tablet, gabapentin (NEURONTIN) 300 MG capsule  Neuropathy, arm, right - Plan: methocarbamol (ROBAXIN) 750 MG tablet, gabapentin (NEURONTIN) 300 MG capsule  Muscle spasm - Plan: methocarbamol (ROBAXIN) 750 MG tablet  Screening for HIV without presence of risk factors - Plan: HIV Antibody (routine testing w rflx)  Encounter for hepatitis C screening test for low risk patient - Plan: Hepatitis C antibody  History of allergic reaction - Plan: EPINEPHrine 0.3 mg/0.3 mL IJ SOAJ injection  Stress incontinence - Plan: mirabegron ER (MYRBETRIQ) 25 MG TB24 tablet  Patient seen today for follow-up of a complex autoimmune disorder Routine labs and also disease sick labs performed as above We discussed her tachycardia, she would be interested in a cardiology consult.  Order placed We will increase her dose of losartan to 75 mg-she will check her blood pressures and let me know how she responds  Try myrbetriq for urge and stress incontinence  Refilled her headache  medication and muscle spasm medicine  She also gets nausea and vomiting with headaches, refilled Zofran  Ordered cervical spine and thoracic spine films due to history of more frequent headaches, cervical spine fusion, and patient sensation of a grinding in her spine  Will plan further follow- up pending labs.   This visit  occurred during the SARS-CoV-2 public health emergency.  Safety protocols were in place, including screening questions prior to the visit, additional usage of staff PPE, and extensive cleaning of exam room while observing appropriate contact time as indicated for disinfecting solutions.   Signed Abbe Amsterdam, MD  Addendum 7/28, received her labs as below-message to patient  Results for orders placed or performed in visit on 10/20/20  Basic metabolic panel  Result Value Ref Range   Sodium 136 135 - 145 mEq/L   Potassium 4.0 3.5 - 5.1 mEq/L   Chloride 100 96 - 112 mEq/L   CO2 25 19 - 32 mEq/L   Glucose, Bld 80 70 - 99 mg/dL   BUN 13 6 - 23 mg/dL   Creatinine, Ser 1.68 0.40 - 1.20 mg/dL   GFR 37.29 >02.11 mL/min   Calcium 8.8 8.4 - 10.5 mg/dL  TSH  Result Value Ref Range   TSH 1.60 0.35 - 5.50 uIU/mL  VITAMIN D 25 Hydroxy (Vit-D Deficiency, Fractures)  Result Value Ref Range   VITD 56.53 30.00 - 100.00 ng/mL  HIV Antibody (routine testing w rflx)  Result Value Ref Range   HIV 1&2 Ab, 4th Generation NON-REACTIVE NON-REACTIVE   Thyroid and vitamin D normal, HIV screening negative as expected

## 2020-10-20 ENCOUNTER — Other Ambulatory Visit: Payer: Self-pay

## 2020-10-20 ENCOUNTER — Encounter: Payer: Self-pay | Admitting: Family Medicine

## 2020-10-20 ENCOUNTER — Ambulatory Visit (INDEPENDENT_AMBULATORY_CARE_PROVIDER_SITE_OTHER): Payer: BC Managed Care – PPO | Admitting: Family Medicine

## 2020-10-20 VITALS — BP 162/104 | HR 104 | Temp 98.2°F | Ht 66.0 in | Wt 310.4 lb

## 2020-10-20 DIAGNOSIS — I10 Essential (primary) hypertension: Secondary | ICD-10-CM

## 2020-10-20 DIAGNOSIS — M5090 Cervical disc disorder, unspecified, unspecified cervical region: Secondary | ICD-10-CM

## 2020-10-20 DIAGNOSIS — Z981 Arthrodesis status: Secondary | ICD-10-CM

## 2020-10-20 DIAGNOSIS — D8989 Other specified disorders involving the immune mechanism, not elsewhere classified: Secondary | ICD-10-CM

## 2020-10-20 DIAGNOSIS — R5383 Other fatigue: Secondary | ICD-10-CM | POA: Diagnosis not present

## 2020-10-20 DIAGNOSIS — R600 Localized edema: Secondary | ICD-10-CM

## 2020-10-20 DIAGNOSIS — Z7952 Long term (current) use of systemic steroids: Secondary | ICD-10-CM

## 2020-10-20 DIAGNOSIS — M62838 Other muscle spasm: Secondary | ICD-10-CM

## 2020-10-20 DIAGNOSIS — R000 Tachycardia, unspecified: Secondary | ICD-10-CM

## 2020-10-20 DIAGNOSIS — M549 Dorsalgia, unspecified: Secondary | ICD-10-CM

## 2020-10-20 DIAGNOSIS — F4329 Adjustment disorder with other symptoms: Secondary | ICD-10-CM

## 2020-10-20 DIAGNOSIS — M3131 Wegener's granulomatosis with renal involvement: Secondary | ICD-10-CM | POA: Diagnosis not present

## 2020-10-20 DIAGNOSIS — Z114 Encounter for screening for human immunodeficiency virus [HIV]: Secondary | ICD-10-CM

## 2020-10-20 DIAGNOSIS — M3130 Wegener's granulomatosis without renal involvement: Secondary | ICD-10-CM

## 2020-10-20 DIAGNOSIS — Z889 Allergy status to unspecified drugs, medicaments and biological substances status: Secondary | ICD-10-CM

## 2020-10-20 DIAGNOSIS — Z1159 Encounter for screening for other viral diseases: Secondary | ICD-10-CM

## 2020-10-20 DIAGNOSIS — G43009 Migraine without aura, not intractable, without status migrainosus: Secondary | ICD-10-CM

## 2020-10-20 DIAGNOSIS — G5691 Unspecified mononeuropathy of right upper limb: Secondary | ICD-10-CM

## 2020-10-20 DIAGNOSIS — N393 Stress incontinence (female) (male): Secondary | ICD-10-CM

## 2020-10-20 MED ORDER — MIRABEGRON ER 25 MG PO TB24: 25.0000 mg | ORAL_TABLET | Freq: Every day | ORAL | 5 refills | Status: DC

## 2020-10-20 MED ORDER — ONDANSETRON HCL 8 MG PO TABS: 8.0000 mg | ORAL_TABLET | Freq: Three times a day (TID) | ORAL | 2 refills | Status: AC | PRN

## 2020-10-20 MED ORDER — LOSARTAN POTASSIUM 25 MG PO TABS: 75.0000 mg | ORAL_TABLET | Freq: Every day | ORAL | 3 refills | Status: DC

## 2020-10-20 MED ORDER — FUROSEMIDE 20 MG PO TABS: 20.0000 mg | ORAL_TABLET | Freq: Every day | ORAL | 3 refills | Status: DC

## 2020-10-20 MED ORDER — GABAPENTIN 300 MG PO CAPS: ORAL_CAPSULE | ORAL | 5 refills | Status: DC

## 2020-10-20 MED ORDER — EPINEPHRINE 0.3 MG/0.3ML IJ SOAJ: INTRAMUSCULAR | 99 refills | Status: DC

## 2020-10-20 MED ORDER — BUTALBITAL-APAP-CAFFEINE 50-325-40 MG PO TABS: 1.0000 | ORAL_TABLET | Freq: Four times a day (QID) | ORAL | 1 refills | Status: DC | PRN

## 2020-10-20 MED ORDER — METHOCARBAMOL 750 MG PO TABS: 750.0000 mg | ORAL_TABLET | Freq: Three times a day (TID) | ORAL | 3 refills | Status: DC | PRN

## 2020-10-20 NOTE — Patient Instructions (Signed)
Good to see you today- I am sorry you are going through so much!  I will be in touch with your labs asap Ok to increase lasix to 40 mg a day; I will let you know if your labs suggest that you need a potassium supplement  Try the Myrbetriq for urinary incontinence.  If not helpful you don't have to keep taking it  Let's increase your losartan to 75 mg for elevated BP  Referral placed to cardiology

## 2020-10-21 LAB — BASIC METABOLIC PANEL
BUN: 13 mg/dL (ref 6–23)
CO2: 25 mEq/L (ref 19–32)
Calcium: 8.8 mg/dL (ref 8.4–10.5)
Chloride: 100 mEq/L (ref 96–112)
Creatinine, Ser: 0.89 mg/dL (ref 0.40–1.20)
GFR: 81.47 mL/min (ref >60.00)
Glucose, Bld: 80 mg/dL (ref 70–99)
Potassium: 4 mEq/L (ref 3.5–5.1)
Sodium: 136 mEq/L (ref 135–145)

## 2020-10-21 LAB — HIV ANTIBODY (ROUTINE TESTING W REFLEX): HIV 1&2 Ab, 4th Generation: NONREACTIVE

## 2020-10-21 LAB — VITAMIN D 25 HYDROXY (VIT D DEFICIENCY, FRACTURES): VITD: 56.53 ng/mL (ref 30.00–100.00)

## 2020-10-21 LAB — HEPATITIS C ANTIBODY
Hepatitis C Ab: NONREACTIVE
SIGNAL TO CUT-OFF: 0 (ref <1.00)

## 2020-10-21 LAB — TSH: TSH: 1.6 u[IU]/mL (ref 0.35–5.50)

## 2020-10-26 ENCOUNTER — Other Ambulatory Visit: Payer: Self-pay

## 2020-10-26 ENCOUNTER — Ambulatory Visit (HOSPITAL_BASED_OUTPATIENT_CLINIC_OR_DEPARTMENT_OTHER)
Admission: RE | Admit: 2020-10-26 | Discharge: 2020-10-26 | Disposition: A | Discharge status: Home / Self Care | Payer: BC Managed Care – PPO | Source: Ambulatory Visit | Attending: Family Medicine | Admitting: Family Medicine

## 2020-10-26 DIAGNOSIS — Z7952 Long term (current) use of systemic steroids: Secondary | ICD-10-CM | POA: Diagnosis not present

## 2020-10-26 DIAGNOSIS — E58 Dietary calcium deficiency: Secondary | ICD-10-CM | POA: Diagnosis not present

## 2020-10-26 DIAGNOSIS — Z13820 Encounter for screening for osteoporosis: Secondary | ICD-10-CM | POA: Diagnosis not present

## 2020-10-26 DIAGNOSIS — M0570 Rheumatoid arthritis with rheumatoid factor of unspecified site without organ or systems involvement: Secondary | ICD-10-CM | POA: Insufficient documentation

## 2020-10-26 DIAGNOSIS — Z79899 Other long term (current) drug therapy: Secondary | ICD-10-CM | POA: Diagnosis not present

## 2020-10-27 ENCOUNTER — Encounter: Payer: Self-pay | Admitting: Family Medicine

## 2020-10-27 DIAGNOSIS — M3130 Wegener's granulomatosis without renal involvement: Secondary | ICD-10-CM | POA: Diagnosis not present

## 2020-10-27 DIAGNOSIS — M0579 Rheumatoid arthritis with rheumatoid factor of multiple sites without organ or systems involvement: Secondary | ICD-10-CM | POA: Diagnosis not present

## 2020-10-27 DIAGNOSIS — R5383 Other fatigue: Secondary | ICD-10-CM | POA: Diagnosis not present

## 2020-10-27 DIAGNOSIS — R809 Proteinuria, unspecified: Secondary | ICD-10-CM | POA: Diagnosis not present

## 2020-10-27 DIAGNOSIS — Z79899 Other long term (current) drug therapy: Secondary | ICD-10-CM | POA: Diagnosis not present

## 2020-10-27 DIAGNOSIS — H15009 Unspecified scleritis, unspecified eye: Secondary | ICD-10-CM | POA: Diagnosis not present

## 2020-10-29 ENCOUNTER — Ambulatory Visit (INDEPENDENT_AMBULATORY_CARE_PROVIDER_SITE_OTHER): Payer: BC Managed Care – PPO | Admitting: Psychology

## 2020-10-29 DIAGNOSIS — F4322 Adjustment disorder with anxiety: Secondary | ICD-10-CM | POA: Diagnosis not present

## 2020-11-01 DIAGNOSIS — E242 Drug-induced Cushing's syndrome: Secondary | ICD-10-CM | POA: Diagnosis not present

## 2020-11-01 DIAGNOSIS — E2749 Other adrenocortical insufficiency: Secondary | ICD-10-CM | POA: Diagnosis not present

## 2020-11-02 ENCOUNTER — Other Ambulatory Visit: Payer: Self-pay

## 2020-11-02 DIAGNOSIS — D849 Immunodeficiency, unspecified: Secondary | ICD-10-CM

## 2020-11-02 MED ORDER — ALBUTEROL SULFATE HFA 108 (90 BASE) MCG/ACT IN AERS: 2.0000 | INHALATION_SPRAY | Freq: Once | RESPIRATORY_TRACT | Status: DC | PRN

## 2020-11-02 MED ORDER — METHYLPREDNISOLONE SODIUM SUCC 125 MG IJ SOLR: 125.0000 mg | Freq: Once | INTRAMUSCULAR | Status: DC | PRN

## 2020-11-02 MED ORDER — CILGAVIMAB (PART OF EVUSHELD) INJECTION
300.0000 mg | Freq: Once | INTRAMUSCULAR | Status: DC
  Filled 2020-11-02: qty 3

## 2020-11-02 MED ORDER — TIXAGEVIMAB (PART OF EVUSHELD) INJECTION
300.0000 mg | Freq: Once | INTRAMUSCULAR | Status: DC
  Filled 2020-11-02: qty 3

## 2020-11-02 MED ORDER — DIPHENHYDRAMINE HCL 50 MG/ML IJ SOLN: 50.0000 mg | Freq: Once | INTRAMUSCULAR | Status: DC | PRN

## 2020-11-02 MED ORDER — EPINEPHRINE 0.3 MG/0.3ML IJ SOAJ: 0.3000 mg | Freq: Once | INTRAMUSCULAR | Status: DC | PRN

## 2020-11-11 DIAGNOSIS — Z79899 Other long term (current) drug therapy: Secondary | ICD-10-CM | POA: Diagnosis not present

## 2020-11-11 DIAGNOSIS — H40041 Steroid responder, right eye: Secondary | ICD-10-CM | POA: Diagnosis not present

## 2020-11-11 DIAGNOSIS — H15013 Anterior scleritis, bilateral: Secondary | ICD-10-CM | POA: Diagnosis not present

## 2020-11-11 DIAGNOSIS — H53042 Amblyopia suspect, left eye: Secondary | ICD-10-CM | POA: Diagnosis not present

## 2020-11-21 IMAGING — US US BREAST*R* LIMITED INC AXILLA
1 series · 11 of 11 positions shown · non-contrast
Comparison: Previous exam(s).

CLINICAL DATA: Here for six-month follow-up of a probably benign
right breast asymmetry and a probably benign right breast mass. The
patient is considering breast reduction surgery.

EXAM:
DIGITAL DIAGNOSTIC right MAMMOGRAM WITH TOMO
ULTRASOUND right BREAST

[Series 1: us breast*right* limited inc axilla · 0.07mm/px · 11 of 11 slices shown]
[im 1/11]
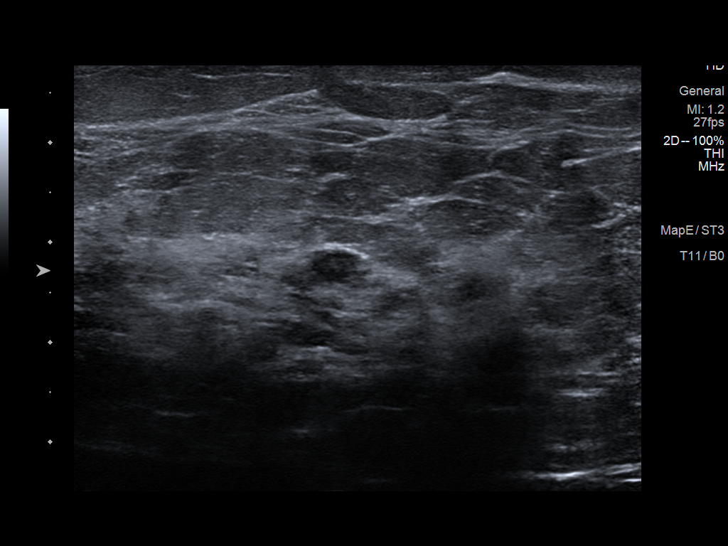
[im 2/11]
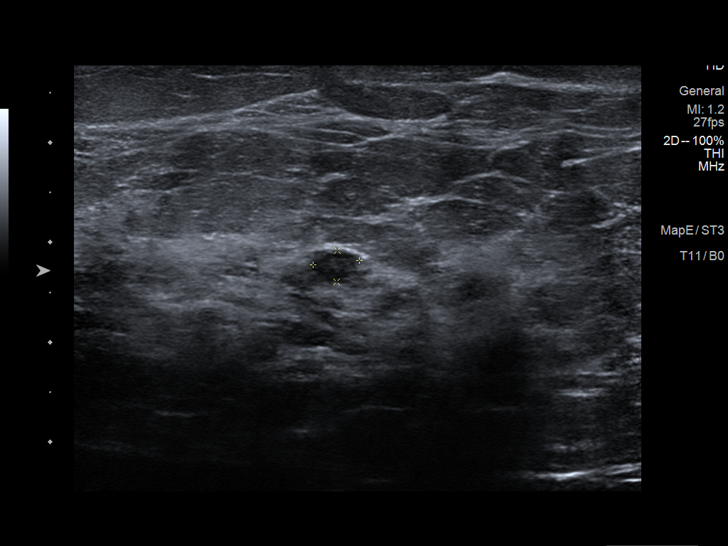
[im 3/11]
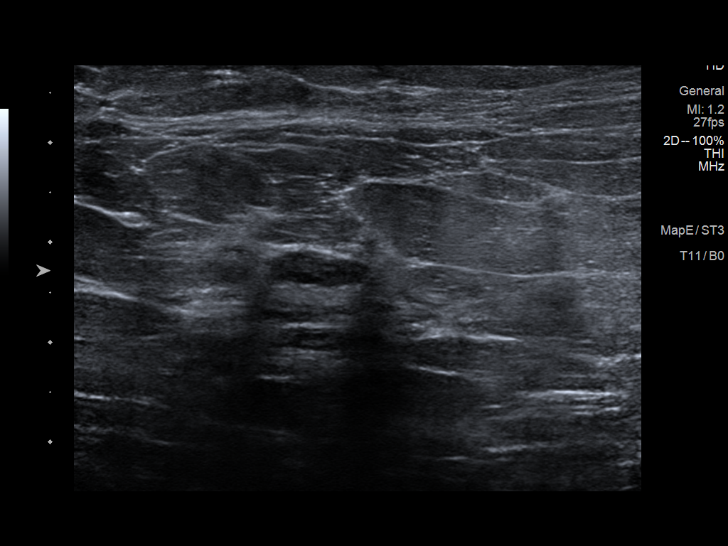
[im 4/11]
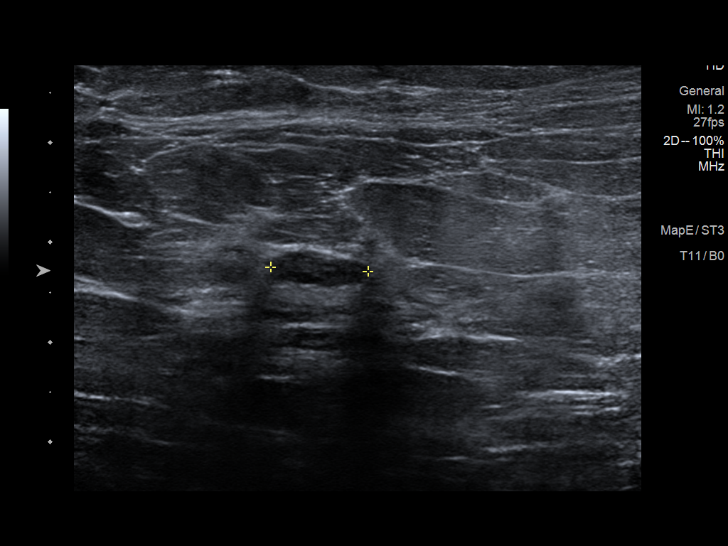
[im 5/11]
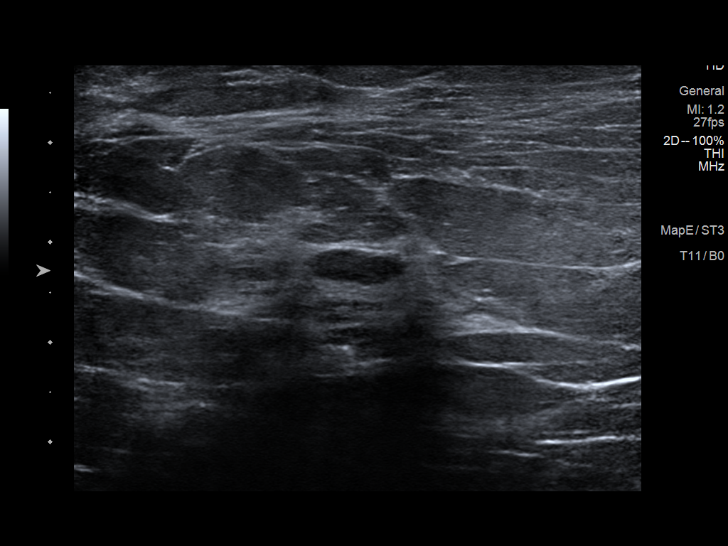
[im 6/11]
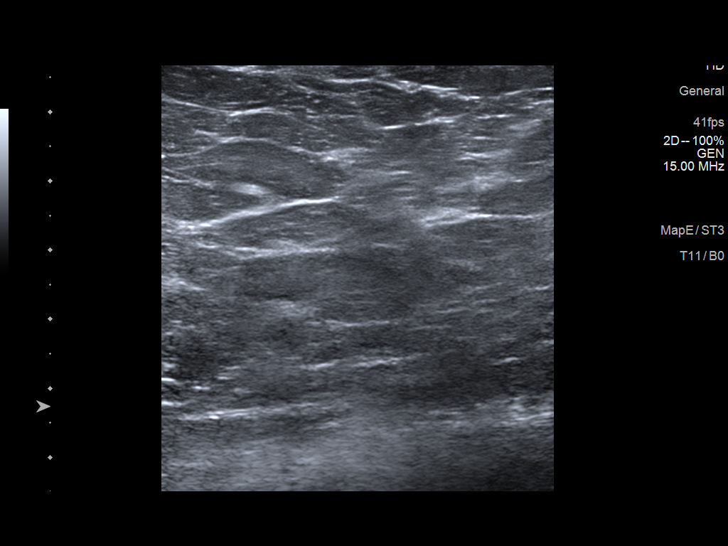
[im 7/11]
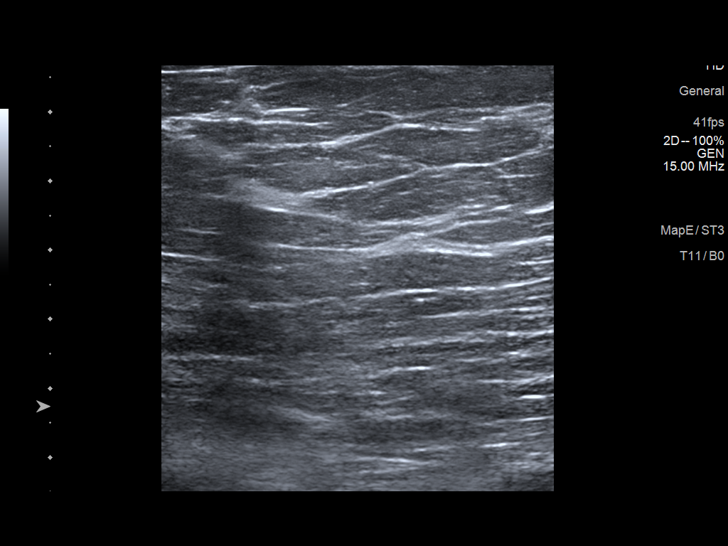
[im 8/11]
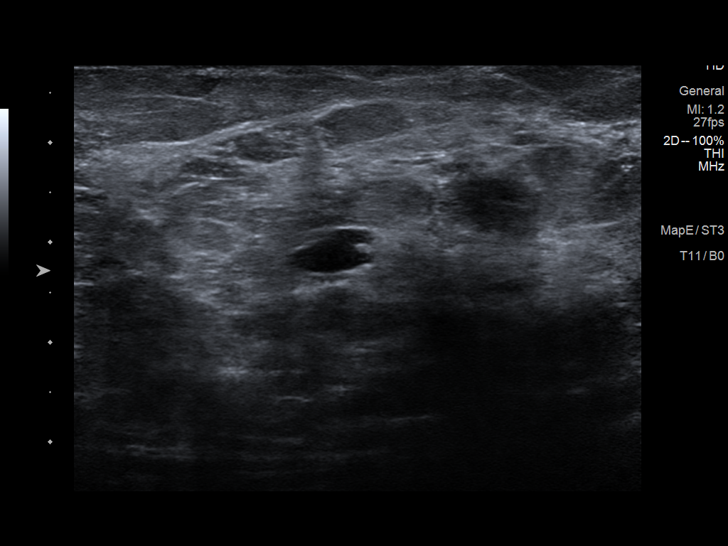
[im 9/11]
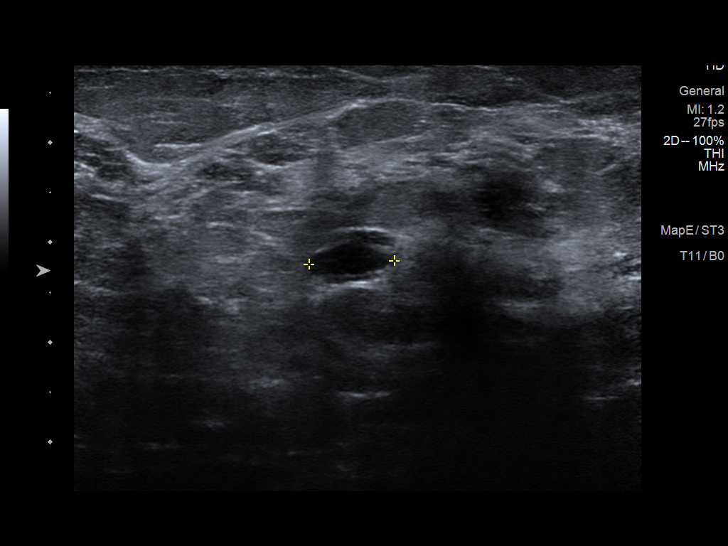
[im 10/11]
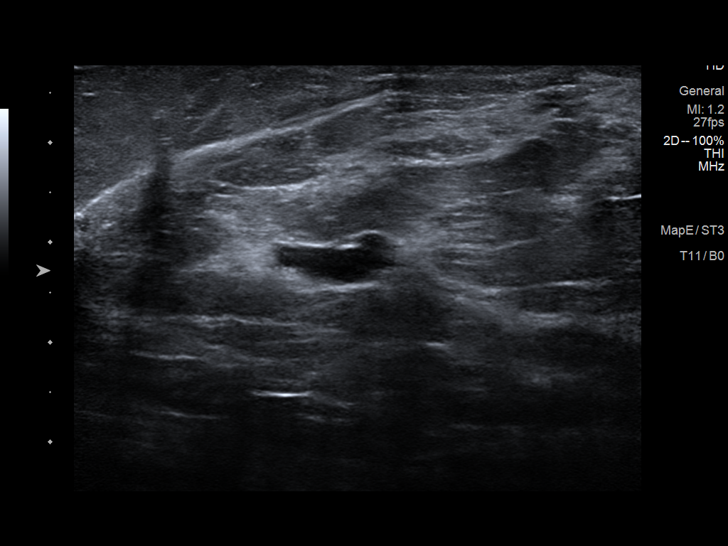
[im 11/11]
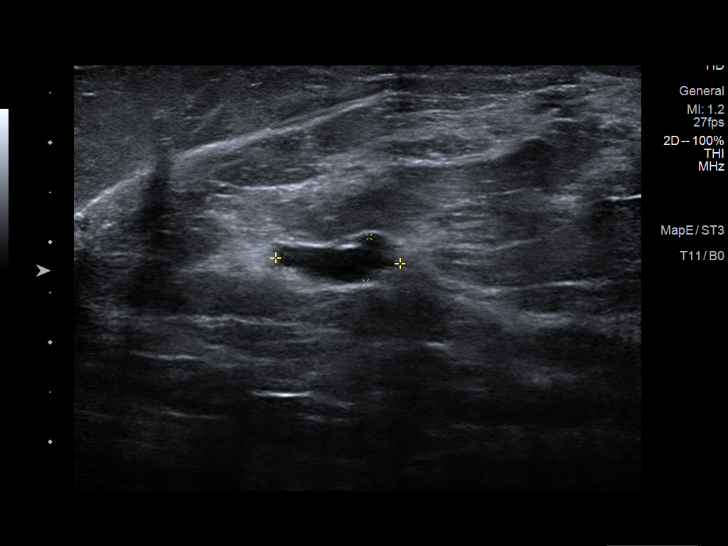

[11 of 11 positions shown; findings below may reference images not displayed]

ACR Breast Density Category c: The breast tissue is heterogeneously
dense, which may obscure small masses.
FINDINGS: An asymmetry in the upper outer right breast is not significantly
changed since 05/20/2018.

Targeted right breast ultrasound was performed by the sonographer
and the physician.

At 10 o'clock 8 cm from the nipple an oval circumscribed hypoechoic
mass measuring 1.0 x 0.5 x 0.3 cm is not significantly changed since
05/24/2018.

The previously described probable complicated cyst at 9 o'clock 8 cm
from the nipple is no longer identified. This likely represented a
benign cyst which has since resolved. A benign cyst measuring 1.3 x
0.9 x 0.5 cm is seen at 10 o'clock 9 cm from nipple, unchanged from
05/24/2018.
IMPRESSION: A right breast asymmetry and a right breast circumscribed mass are
not significantly changed since April 2018 and remain probably
benign.

RECOMMENDATION:
Recommend follow-up right breast mammogram and ultrasound in 6
months to document 1 year stability. If definitive diagnosis is
required prior to breast reduction surgery, mammogram and ultrasound
guided biopsies could be performed.

I have discussed the findings and recommendations with the patient.
If applicable, a reminder letter will be sent to the patient
regarding the next appointment.

BI-RADS CATEGORY  3: Probably benign.

## 2020-12-07 DIAGNOSIS — E2749 Other adrenocortical insufficiency: Secondary | ICD-10-CM | POA: Diagnosis not present

## 2020-12-07 DIAGNOSIS — E242 Drug-induced Cushing's syndrome: Secondary | ICD-10-CM | POA: Diagnosis not present

## 2020-12-12 NOTE — Progress Notes (Signed)
Referring-Jessica Copland, MD Reason for referral-palpitations  HPI: 40 year old female for evaluation of palpitations at request of Abbe Amsterdam, MD.  Echocardiogram March 2021 showed normal LV function, mild RV dysfunction.  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  Patient states that she has had problems with dyspnea on exertion.  She denies orthopnea or PND but she does have chronic pedal edema.  She denies exertional chest pain.  She has had intermittent palpitations described as an elevated heart rate or at times her heart "racing".  This can last an hour at a time and associated with lightheadedness.  She denies syncope.  Cardiology now asked to evaluate.  Current Outpatient Medications  Medication Sig Dispense Refill   budesonide-formoterol (SYMBICORT) 160-4.5 MCG/ACT inhaler Inhale 2 puffs into the lungs in the morning and at bedtime. 1 each 12   butalbital-acetaminophen-caffeine (FIORICET) 50-325-40 MG tablet Take 1 tablet by mouth every 6 (six) hours as needed for headache. 60 tablet 1   EPINEPHrine 0.3 mg/0.3 mL IJ SOAJ injection INJECT 0.3 MLS (0.3 MG TOTAL) INTO THE MUSCLE ONCE FOR 1 DOSE. 1 each PRN   fexofenadine (ALLEGRA) 180 MG tablet Take 180 mg by mouth daily.     fluticasone (FLONASE) 50 MCG/ACT nasal spray Place 2 sprays into both nostrils in the morning and at bedtime. 16 g 11   furosemide (LASIX) 20 MG tablet Take 1 tablet (20 mg total) by mouth daily. May take 40 mg if needed 180 tablet 3   gabapentin (NEURONTIN) 300 MG capsule TAKE 1 CAPSULE BY MOUTH THREE TIMES A DAY 90 capsule 5   hydrocortisone (CORTEF) 10 MG tablet 10 mg Am and 5 mg PM.     hydroxychloroquine (PLAQUENIL) 200 MG tablet      hydroxychloroquine (PLAQUENIL) 200 MG tablet Take 200 mg by mouth 2 (two) times daily.     ketorolac (ACULAR) 0.5 % ophthalmic solution Place 1 drop into the left eye 4 (four) times daily.     losartan (COZAAR) 25 MG tablet  Take 3 tablets (75 mg total) by mouth daily. 135 tablet 3   methocarbamol (ROBAXIN) 750 MG tablet Take 1 tablet (750 mg total) by mouth every 8 (eight) hours as needed for muscle spasms. 90 tablet 3   mirabegron ER (MYRBETRIQ) 25 MG TB24 tablet Take 1 tablet (25 mg total) by mouth daily. 30 tablet 5   Multiple Vitamin (MULTIVITAMIN) tablet Take 1 tablet by mouth daily.     mycophenolate (CELLCEPT) 500 MG tablet Take 1,500 mg by mouth 2 (two) times daily.     ondansetron (ZOFRAN) 8 MG tablet Take 1 tablet (8 mg total) by mouth every 8 (eight) hours as needed for nausea or vomiting. 30 tablet 2   PROAIR HFA 108 (90 Base) MCG/ACT inhaler Inhale 1-2 puffs into the lungs daily as needed for shortness of breath. 18 g 5   VITAMIN D PO Take by mouth.     azelastine (ASTELIN) 0.1 % nasal spray Place 1 spray into both nostrils 2 (two) times daily. Use in each nostril as directed (Patient not taking: Reported on 12/15/2020) 30 mL 12   budesonide (PULMICORT) 0.25 MG/2ML nebulizer solution Take 2 mLs (0.25 mg total) by nebulization in the morning and at bedtime. (Patient not taking: Reported on 12/15/2020) 60 mL 12   prednisoLONE acetate (PRED FORTE) 1 % ophthalmic suspension 1 drop 3 (three) times daily.     Current Facility-Administered Medications  Medication Dose Route  Frequency Provider Last Rate Last Admin   albuterol (VENTOLIN HFA) 108 (90 Base) MCG/ACT inhaler 2 puff  2 puff Inhalation Once PRN Zenovia Jordan, MD       cilgavimab (part of EVUSHELD) injection SOLN 300 mg  300 mg Intramuscular Once Zenovia Jordan, MD       diphenhydrAMINE (BENADRYL) injection 50 mg  50 mg Intramuscular Once PRN Zenovia Jordan, MD       EPINEPHrine (EPI-PEN) injection 0.3 mg  0.3 mg Intramuscular Once PRN Zenovia Jordan, MD       methylPREDNISolone sodium succinate (SOLU-MEDROL) 125 mg/2 mL injection 125 mg  125 mg Intramuscular Once PRN Zenovia Jordan, MD       tixagevimab (part of EVUSHELD) injection SOLN 300 mg  300 mg  Intramuscular Once Zenovia Jordan, MD        Allergies  Allergen Reactions   Albuterol Other (See Comments) and Shortness Of Breath   Other Anaphylaxis    Essential Oil, scent unknown, caused tongue swelling   Truxima [Rituximab] Anaphylaxis   Penicillins Hives   Sulfa Antibiotics      Past Medical History:  Diagnosis Date   Allergic rhinitis    Asthma    Chronic kidney disease    Granulomatosis with polyangiitis (HCC)    Hypertension    Recurrent upper respiratory infection (URI)     Past Surgical History:  Procedure Laterality Date   ADENOIDECTOMY     ANTERIOR CERVICAL DECOMP/DISCECTOMY FUSION      Social History   Socioeconomic History   Marital status: Single    Spouse name: Not on file   Number of children: 0   Years of education: Not on file   Highest education level: Not on file  Occupational History   Not on file  Tobacco Use   Smoking status: Never   Smokeless tobacco: Never  Vaping Use   Vaping Use: Never used  Substance and Sexual Activity   Alcohol use: Yes    Comment: Rare   Drug use: No   Sexual activity: Not on file  Other Topics Concern   Not on file  Social History Narrative   Not on file   Social Determinants of Health   Financial Resource Strain: Not on file  Food Insecurity: Not on file  Transportation Needs: Not on file  Physical Activity: Not on file  Stress: Not on file  Social Connections: Not on file  Intimate Partner Violence: Not on file    Family History  Problem Relation Age of Onset   Diabetes Mother        Type 1 Diabetes, juvenile onset   Hypertension Mother    Pulmonary Hypertension Mother    Hyperthyroidism Mother    Glaucoma Mother    Neuropathy Mother    Sjogren's syndrome Mother    Carpal tunnel syndrome Mother    Hypertension Father    Gallbladder disease Father    Hypertension Maternal Grandmother    Glaucoma Maternal Grandmother    Coronary artery disease Maternal Grandmother    Heart attack  Maternal Grandmother    Angina Maternal Grandmother    Macular degeneration Maternal Grandmother    COPD Maternal Grandmother    Congestive Heart Failure Maternal Grandmother    Glaucoma Maternal Grandfather    Leukemia Paternal Grandmother    Hypertension Paternal Grandmother    Colon cancer Paternal Grandfather    Diabetes Maternal Uncle    Heart attack Maternal Uncle    Stroke Maternal Uncle  Allergic rhinitis Neg Hx    Angioedema Neg Hx    Asthma Neg Hx    Eczema Neg Hx     ROS: Arthralgias and back pain but no fevers or chills, productive cough, hemoptysis, dysphasia, odynophagia, melena, hematochezia, dysuria, hematuria, rash, seizure activity, orthopnea, PND, claudication. Remaining systems are negative.  Physical Exam:   Blood pressure (!) 160/80, pulse 92, height 5\' 7"  (1.702 m), weight (!) 304 lb (137.9 kg), SpO2 99 %.  General:  Well developed/obese in NAD Skin warm/dry Patient not depressed No peripheral clubbing Back-normal HEENT-normal/normal eyelids Neck supple/normal carotid upstroke bilaterally; no bruits; no JVD; no thyromegaly chest - CTA/ normal expansion CV - RRR/normal S1 and S2; no murmurs, rubs or gallops;  PMI nondisplaced Abdomen -NT/ND, no HSM, no mass, + bowel sounds, no bruit 2+ femoral pulses, no bruits Ext-trace edema, no chords, 2+ DP Neuro-grossly nonfocal  ECG -normal sinus rhythm at a rate of 92, short PR interval, nonspecific ST changes, cannot rule out inferior infarct.  Personally reviewed  A/P  1 palpitations/tachycardia-etiology unclear.  We will arrange a 14-day monitor to further assess.  Schedule echocardiogram to assess LV function.  2 hypertension-blood pressure elevated today but typically controlled.  We will follow and advance medications as needed.  3 lower extremity edema-continue Lasix at present dose.  4 dyspnea on exertion-we will arrange echocardiogram to assess LV function.  5 granulomatosis with  polyangiitis/severe asthma-Per pulmonary.  , MD

## 2020-12-14 ENCOUNTER — Ambulatory Visit (INDEPENDENT_AMBULATORY_CARE_PROVIDER_SITE_OTHER): Payer: BC Managed Care – PPO | Admitting: Psychology

## 2020-12-14 DIAGNOSIS — F4322 Adjustment disorder with anxiety: Secondary | ICD-10-CM | POA: Diagnosis not present

## 2020-12-15 ENCOUNTER — Encounter: Payer: Self-pay | Admitting: Cardiology

## 2020-12-15 ENCOUNTER — Other Ambulatory Visit: Payer: Self-pay

## 2020-12-15 ENCOUNTER — Ambulatory Visit: Payer: BC Managed Care – PPO | Admitting: Cardiology

## 2020-12-15 ENCOUNTER — Ambulatory Visit (INDEPENDENT_AMBULATORY_CARE_PROVIDER_SITE_OTHER): Payer: BC Managed Care – PPO

## 2020-12-15 VITALS — BP 160/80 | HR 92 | Ht 67.0 in | Wt 304.0 lb

## 2020-12-15 DIAGNOSIS — R0609 Other forms of dyspnea: Secondary | ICD-10-CM

## 2020-12-15 DIAGNOSIS — R002 Palpitations: Secondary | ICD-10-CM

## 2020-12-15 DIAGNOSIS — R0600 Dyspnea, unspecified: Secondary | ICD-10-CM | POA: Diagnosis not present

## 2020-12-15 DIAGNOSIS — I10 Essential (primary) hypertension: Secondary | ICD-10-CM | POA: Diagnosis not present

## 2020-12-15 NOTE — Progress Notes (Unsigned)
Enrolled patient for a 14 day Zio XT  monitor to be mailed to patients home  °

## 2020-12-15 NOTE — Patient Instructions (Signed)
Testing/Procedures:  Your physician has requested that you have an echocardiogram. Echocardiography is a painless test that uses sound waves to create images of your heart. It provides your doctor with information about the size and shape of your heart and how well your heart's chambers and valves are working. This procedure takes approximately one hour. There are no restrictions for this procedure. [facility]-1 ST FLOOR IMAGING DEPARTMENT  ZIO XT- Long Term Monitor Instructions  Your physician has requested you wear a ZIO patch monitor for 14 days.  This is a single patch monitor. [employer] supplies one patch monitor per enrollment. Additional stickers are not available. Please do not apply patch if you will be having a Nuclear Stress Test,  Echocardiogram, Cardiac CT, MRI, or Chest Xray during the period you would be wearing the  monitor. The patch cannot be worn during these tests. You cannot remove and re-apply the  ZIO XT patch monitor.  Your ZIO patch monitor will be mailed 3 day USPS to your address on file. It may take 3-5 days  to receive your monitor after you have been enrolled.  Once you have received your monitor, please review the enclosed instructions. Your monitor  has already been registered assigning a specific monitor serial # to you.  Billing and Patient Assistance Program Information  We have supplied [employer] with any of your insurance information on file for billing purposes. [employer] offers a sliding scale Patient Assistance Program for patients that do not have  insurance, or whose insurance does not completely cover the cost of the ZIO monitor.  You must apply for the Patient Assistance Program to qualify for this discounted rate.  To apply, please call [employer] at [phone], select option 4, select option 2, ask to apply for  Patient Assistance Program. Meredeth Ide will ask your household income, and how many people  are in your household. They will quote your  out-of-pocket cost based on that information.  [employer] will also be able to set up a 34-month, interest-free payment plan if needed.  Applying the monitor   Shave hair from upper left chest.  Hold abrader disc by orange tab. Rub abrader in 40 strokes over the upper left chest as  indicated in your monitor instructions.  Clean area with 4 enclosed alcohol pads. Let dry.  Apply patch as indicated in monitor instructions. Patch will be placed under collarbone on left  side of chest with arrow pointing upward.  Rub patch adhesive wings for 2 minutes. Remove white label marked "1". Remove the white  label marked "2". Rub patch adhesive wings for 2 additional minutes.  While looking in a mirror, press and release button in center of patch. A small green light will  flash 3-4 times. This will be your only indicator that the monitor has been turned on.  Do not shower for the first 24 hours. You may shower after the first 24 hours.  Press the button if you feel a symptom. You will hear a small click. Record Date, Time and  Symptom in the Patient Logbook.  When you are ready to remove the patch, follow instructions on the last 2 pages of Patient  Logbook. Stick patch monitor onto the last page of Patient Logbook.  Place Patient Logbook in the blue and white box. Use locking tab on box and tape box closed  securely. The blue and white box has prepaid postage on it. Please place it in the mailbox as  soon as possible. Your physician  should have your test results approximately 7 days after the  monitor has been mailed back to [facility].  Call [facility] Customer Care at [phone] if you have questions regarding  your ZIO XT patch monitor. Call them immediately if you see an orange light blinking on your  monitor.  If your monitor falls off in less than 4 days, contact our Monitor department at [phone].  If your monitor becomes loose or falls off after 4 days call [employer] at  [phone] for  suggestions on securing your monitor    Follow-Up: At [facility], you and your health needs are our priority.  As part of our continuing mission to provide you with exceptional heart care, we have created designated Provider Care Teams.  These Care Teams include your primary Cardiologist (physician) and Advanced Practice Providers (APPs -  Physician Assistants and Nurse Practitioners) who all work together to provide you with the care you need, when you need it.  We recommend signing up for the patient portal called "MyChart".  Sign up information is provided on this After Visit Summary.  MyChart is used to connect with patients for Virtual Visits (Telemedicine).  Patients are able to view lab/test results, encounter notes, upcoming appointments, etc.  Non-urgent messages can be sent to your provider as well.   To learn more about what you can do with MyChart, go to [email or web address].    Your next appointment:   3 month(s)  The format for your next appointment:   In Person  Provider:   Olga Millers, MD

## 2020-12-27 ENCOUNTER — Ambulatory Visit (HOSPITAL_BASED_OUTPATIENT_CLINIC_OR_DEPARTMENT_OTHER)
Admission: RE | Admit: 2020-12-27 | Discharge: 2020-12-27 | Disposition: A | Discharge status: Home / Self Care | Payer: BC Managed Care – PPO | Source: Ambulatory Visit | Attending: Cardiology | Admitting: Cardiology

## 2020-12-27 ENCOUNTER — Other Ambulatory Visit: Payer: Self-pay

## 2020-12-27 DIAGNOSIS — R0602 Shortness of breath: Secondary | ICD-10-CM

## 2020-12-27 DIAGNOSIS — R002 Palpitations: Secondary | ICD-10-CM | POA: Diagnosis not present

## 2020-12-28 LAB — ECHOCARDIOGRAM COMPLETE
AR max vel: 2.22 cm2
AV Area VTI: 1.93 cm2
AV Area mean vel: 2.13 cm2
AV Mean grad: 6 mmHg
AV Peak grad: 9 mmHg
Ao pk vel: 1.5 m/s
Area-P 1/2: 3.66 cm2
Calc EF: 57 %
S' Lateral: 3.6 cm
Single Plane A2C EF: 63.6 %
Single Plane A4C EF: 54 %

## 2020-12-29 ENCOUNTER — Other Ambulatory Visit: Payer: Self-pay

## 2020-12-29 ENCOUNTER — Ambulatory Visit (INDEPENDENT_AMBULATORY_CARE_PROVIDER_SITE_OTHER): Payer: BC Managed Care – PPO

## 2020-12-29 ENCOUNTER — Other Ambulatory Visit (HOSPITAL_COMMUNITY): Payer: Self-pay

## 2020-12-29 DIAGNOSIS — Z298 Encounter for other specified prophylactic measures: Secondary | ICD-10-CM | POA: Diagnosis not present

## 2020-12-29 DIAGNOSIS — D849 Immunodeficiency, unspecified: Secondary | ICD-10-CM

## 2020-12-29 MED ORDER — METHYLPREDNISOLONE SODIUM SUCC 125 MG IJ SOLR: 125.0000 mg | Freq: Once | INTRAMUSCULAR | Status: DC | PRN

## 2020-12-29 MED ORDER — TIXAGEVIMAB (PART OF EVUSHELD) INJECTION
300.0000 mg | Freq: Once | INTRAMUSCULAR | Status: AC
  Administered 2020-12-29: 300 mg via INTRAMUSCULAR
  Filled 2020-12-29: qty 3

## 2020-12-29 MED ORDER — ALBUTEROL SULFATE HFA 108 (90 BASE) MCG/ACT IN AERS: 2.0000 | INHALATION_SPRAY | Freq: Once | RESPIRATORY_TRACT | Status: DC | PRN

## 2020-12-29 MED ORDER — EPINEPHRINE 0.3 MG/0.3ML IJ SOAJ: 0.3000 mg | Freq: Once | INTRAMUSCULAR | Status: DC | PRN

## 2020-12-29 MED ORDER — DIPHENHYDRAMINE HCL 50 MG/ML IJ SOLN: 50.0000 mg | Freq: Once | INTRAMUSCULAR | Status: DC | PRN

## 2020-12-29 MED ORDER — CILGAVIMAB (PART OF EVUSHELD) INJECTION
300.0000 mg | Freq: Once | INTRAMUSCULAR | Status: AC
  Administered 2020-12-29: 300 mg via INTRAMUSCULAR
  Filled 2020-12-29: qty 3

## 2020-12-29 NOTE — Progress Notes (Signed)
Diagnosis: Pre-COVID Exposure Prophylaxis  Provider:  Praveen Mannam, MD  Procedure: Injection  Evusheld, Dose: 300 mg, Site: intramuscular  Discharge: Condition: Good, Destination: Home . AVS provided to patient.   Performed by:  Silvina Hackleman E, LPN        

## 2021-01-01 DIAGNOSIS — R002 Palpitations: Secondary | ICD-10-CM | POA: Diagnosis not present

## 2021-01-06 DIAGNOSIS — E242 Drug-induced Cushing's syndrome: Secondary | ICD-10-CM | POA: Diagnosis not present

## 2021-01-06 DIAGNOSIS — E2749 Other adrenocortical insufficiency: Secondary | ICD-10-CM | POA: Diagnosis not present

## 2021-01-21 DIAGNOSIS — R002 Palpitations: Secondary | ICD-10-CM | POA: Diagnosis not present

## 2021-01-28 DIAGNOSIS — J329 Chronic sinusitis, unspecified: Secondary | ICD-10-CM | POA: Diagnosis not present

## 2021-01-28 DIAGNOSIS — R059 Cough, unspecified: Secondary | ICD-10-CM | POA: Diagnosis not present

## 2021-02-02 DIAGNOSIS — M3130 Wegener's granulomatosis without renal involvement: Secondary | ICD-10-CM | POA: Diagnosis not present

## 2021-02-02 DIAGNOSIS — M0579 Rheumatoid arthritis with rheumatoid factor of multiple sites without organ or systems involvement: Secondary | ICD-10-CM | POA: Diagnosis not present

## 2021-02-02 DIAGNOSIS — M2550 Pain in unspecified joint: Secondary | ICD-10-CM | POA: Diagnosis not present

## 2021-02-02 DIAGNOSIS — H15009 Unspecified scleritis, unspecified eye: Secondary | ICD-10-CM | POA: Diagnosis not present

## 2021-02-04 ENCOUNTER — Ambulatory Visit (INDEPENDENT_AMBULATORY_CARE_PROVIDER_SITE_OTHER): Payer: BC Managed Care – PPO | Admitting: Psychology

## 2021-02-04 DIAGNOSIS — F4322 Adjustment disorder with anxiety: Secondary | ICD-10-CM | POA: Diagnosis not present

## 2021-02-09 ENCOUNTER — Other Ambulatory Visit: Payer: Self-pay | Admitting: Family Medicine

## 2021-02-09 DIAGNOSIS — G5691 Unspecified mononeuropathy of right upper limb: Secondary | ICD-10-CM

## 2021-02-09 DIAGNOSIS — M62838 Other muscle spasm: Secondary | ICD-10-CM

## 2021-02-09 DIAGNOSIS — M5090 Cervical disc disorder, unspecified, unspecified cervical region: Secondary | ICD-10-CM

## 2021-02-24 DIAGNOSIS — E242 Drug-induced Cushing's syndrome: Secondary | ICD-10-CM | POA: Diagnosis not present

## 2021-02-24 DIAGNOSIS — E2749 Other adrenocortical insufficiency: Secondary | ICD-10-CM | POA: Diagnosis not present

## 2021-03-02 NOTE — Progress Notes (Deleted)
HPI: Follow-up palpitations. 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  Echocardiogram October 2022 showed normal LV function, grade 1 diastolic dysfunction.  Monitor October 2022 showed sinus rhythm with rare PAC and PVC; isolated triplet noted.  Patient had symptoms associated with sinus rhythm.  Since last seen  Current Outpatient Medications  Medication Sig Dispense Refill   azelastine (ASTELIN) 0.1 % nasal spray Place 1 spray into both nostrils 2 (two) times daily. Use in each nostril as directed (Patient not taking: Reported on 12/15/2020) 30 mL 12   budesonide (PULMICORT) 0.25 MG/2ML nebulizer solution Take 2 mLs (0.25 mg total) by nebulization in the morning and at bedtime. (Patient not taking: Reported on 12/15/2020) 60 mL 12   budesonide-formoterol (SYMBICORT) 160-4.5 MCG/ACT inhaler Inhale 2 puffs into the lungs in the morning and at bedtime. 1 each 12   butalbital-acetaminophen-caffeine (FIORICET) 50-325-40 MG tablet Take 1 tablet by mouth every 6 (six) hours as needed for headache. 60 tablet 1   EPINEPHrine 0.3 mg/0.3 mL IJ SOAJ injection INJECT 0.3 MLS (0.3 MG TOTAL) INTO THE MUSCLE ONCE FOR 1 DOSE. 1 each PRN   fexofenadine (ALLEGRA) 180 MG tablet Take 180 mg by mouth daily.     fluticasone (FLONASE) 50 MCG/ACT nasal spray Place 2 sprays into both nostrils in the morning and at bedtime. 16 g 11   furosemide (LASIX) 20 MG tablet Take 1 tablet (20 mg total) by mouth daily. May take 40 mg if needed 180 tablet 3   gabapentin (NEURONTIN) 300 MG capsule TAKE 1 CAPSULE BY MOUTH THREE TIMES A DAY 90 capsule 5   hydrocortisone (CORTEF) 10 MG tablet 10 mg Am and 5 mg PM.     hydroxychloroquine (PLAQUENIL) 200 MG tablet      hydroxychloroquine (PLAQUENIL) 200 MG tablet Take 200 mg by mouth 2 (two) times daily.     ketorolac (ACULAR) 0.5 % ophthalmic solution Place 1 drop into the left eye 4 (four) times daily.     losartan (COZAAR) 25 MG  tablet Take 3 tablets (75 mg total) by mouth daily. 135 tablet 3   methocarbamol (ROBAXIN) 750 MG tablet TAKE 1 TABLET (750 MG TOTAL) BY MOUTH EVERY 8 (EIGHT) HOURS AS NEEDED FOR MUSCLE SPASMS. 90 tablet 3   mirabegron ER (MYRBETRIQ) 25 MG TB24 tablet Take 1 tablet (25 mg total) by mouth daily. 30 tablet 5   Multiple Vitamin (MULTIVITAMIN) tablet Take 1 tablet by mouth daily.     mycophenolate (CELLCEPT) 500 MG tablet Take 1,500 mg by mouth 2 (two) times daily.     ondansetron (ZOFRAN) 8 MG tablet Take 1 tablet (8 mg total) by mouth every 8 (eight) hours as needed for nausea or vomiting. 30 tablet 2   prednisoLONE acetate (PRED FORTE) 1 % ophthalmic suspension 1 drop 3 (three) times daily.     PROAIR HFA 108 (90 Base) MCG/ACT inhaler Inhale 1-2 puffs into the lungs daily as needed for shortness of breath. 18 g 5   VITAMIN D PO Take by mouth.     Current Facility-Administered Medications  Medication Dose Route Frequency Provider Last Rate Last Admin   albuterol (VENTOLIN HFA) 108 (90 Base) MCG/ACT inhaler 2 puff  2 puff Inhalation Once PRN Gavin Pound, MD       albuterol (VENTOLIN HFA) 108 (90 Base) MCG/ACT inhaler 2 puff  2 puff Inhalation Once PRN Gavin Pound, MD       cilgavimab (part  of EVUSHELD) injection SOLN 300 mg  300 mg Intramuscular Once Zenovia Jordan, MD       diphenhydrAMINE (BENADRYL) injection 50 mg  50 mg Intramuscular Once PRN Zenovia Jordan, MD       diphenhydrAMINE (BENADRYL) injection 50 mg  50 mg Intramuscular Once PRN Zenovia Jordan, MD       EPINEPHrine (EPI-PEN) injection 0.3 mg  0.3 mg Intramuscular Once PRN Zenovia Jordan, MD       EPINEPHrine (EPI-PEN) injection 0.3 mg  0.3 mg Intramuscular Once PRN Zenovia Jordan, MD       methylPREDNISolone sodium succinate (SOLU-MEDROL) 125 mg/2 mL injection 125 mg  125 mg Intramuscular Once PRN Zenovia Jordan, MD       methylPREDNISolone sodium succinate (SOLU-MEDROL) 125 mg/2 mL injection 125 mg  125 mg Intramuscular Once PRN  Zenovia Jordan, MD       tixagevimab (part of EVUSHELD) injection SOLN 300 mg  300 mg Intramuscular Once Zenovia Jordan, MD         Past Medical History:  Diagnosis Date   Allergic rhinitis    Asthma    Chronic kidney disease    Granulomatosis with polyangiitis (HCC)    Hypertension    Recurrent upper respiratory infection (URI)     Past Surgical History:  Procedure Laterality Date   ADENOIDECTOMY     ANTERIOR CERVICAL DECOMP/DISCECTOMY FUSION      Social History   Socioeconomic History   Marital status: Single    Spouse name: Not on file   Number of children: 0   Years of education: Not on file   Highest education level: Not on file  Occupational History   Not on file  Tobacco Use   Smoking status: Never   Smokeless tobacco: Never  Vaping Use   Vaping Use: Never used  Substance and Sexual Activity   Alcohol use: Yes    Comment: Rare   Drug use: No   Sexual activity: Not on file  Other Topics Concern   Not on file  Social History Narrative   Not on file   Social Determinants of Health   Financial Resource Strain: Not on file  Food Insecurity: Not on file  Transportation Needs: Not on file  Physical Activity: Not on file  Stress: Not on file  Social Connections: Not on file  Intimate Partner Violence: Not on file    Family History  Problem Relation Age of Onset   Diabetes Mother        Type 1 Diabetes, juvenile onset   Hypertension Mother    Pulmonary Hypertension Mother    Hyperthyroidism Mother    Glaucoma Mother    Neuropathy Mother    Sjogren's syndrome Mother    Carpal tunnel syndrome Mother    Hypertension Father    Gallbladder disease Father    Hypertension Maternal Grandmother    Glaucoma Maternal Grandmother    Coronary artery disease Maternal Grandmother    Heart attack Maternal Grandmother    Angina Maternal Grandmother    Macular degeneration Maternal Grandmother    COPD Maternal Grandmother    Congestive Heart Failure Maternal  Grandmother    Glaucoma Maternal Grandfather    Leukemia Paternal Grandmother    Hypertension Paternal Grandmother    Colon cancer Paternal Grandfather    Diabetes Maternal Uncle    Heart attack Maternal Uncle    Stroke Maternal Uncle    Allergic rhinitis Neg Hx    Angioedema Neg Hx    Asthma Neg  Hx    Eczema Neg Hx     ROS: no fevers or chills, productive cough, hemoptysis, dysphasia, odynophagia, melena, hematochezia, dysuria, hematuria, rash, seizure activity, orthopnea, PND, pedal edema, claudication. Remaining systems are negative.  Physical Exam: Well-developed well-nourished in no acute distress.  Skin is warm and dry.  HEENT is normal.  Neck is supple.  Chest is clear to auscultation with normal expansion.  Cardiovascular exam is regular rate and rhythm.  Abdominal exam nontender or distended. No masses palpated. Extremities show no edema. neuro grossly intact  ECG- personally reviewed  A/P  1 palpitations-recent monitor showed no significant arrhythmia.  Echocardiogram shows normal LV function.  2 hypertension-blood pressure controlled.  Continue present medications.  3 history of lower extremity edema-controlled with present dose of Lasix.  We will continue.  4 dyspnea-echocardiogram shows preserved LV function.  This is likely secondary to granulomatosis with polyangiitis/severe asthma.  Follow-up pulmonary.  Kirk Ruths, MD

## 2021-03-09 ENCOUNTER — Ambulatory Visit: Payer: BC Managed Care – PPO | Admitting: Cardiology

## 2021-04-06 ENCOUNTER — Ambulatory Visit: Payer: BC Managed Care – PPO | Admitting: Cardiology

## 2021-04-07 ENCOUNTER — Ambulatory Visit: Payer: BC Managed Care – PPO | Admitting: Psychology

## 2021-04-11 IMAGING — CT CT MAXILLOFACIAL W/O CM
1 series · 15 of 30 positions shown, 19 images · non-contrast
Comparison: None.

CLINICAL DATA: Chronic sinusitis.

EXAM:
CT MAXILLOFACIAL WITHOUT CONTRAST
TECHNIQUE: Multidetector CT images of the paranasal sinuses were obtained using
the standard protocol without intravenous contrast.

[Series 4: soft tissue · axial · 0.38mm/px · z∈[-209,-93]mm · 15 of 126 slices shown, 19 images]
[im 5/126  brain]
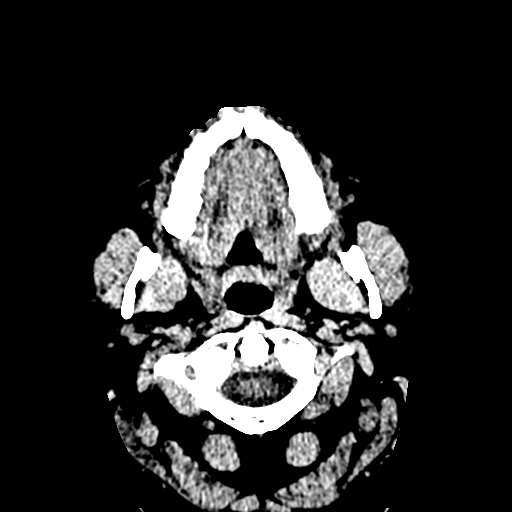
[im 5/126  bone]
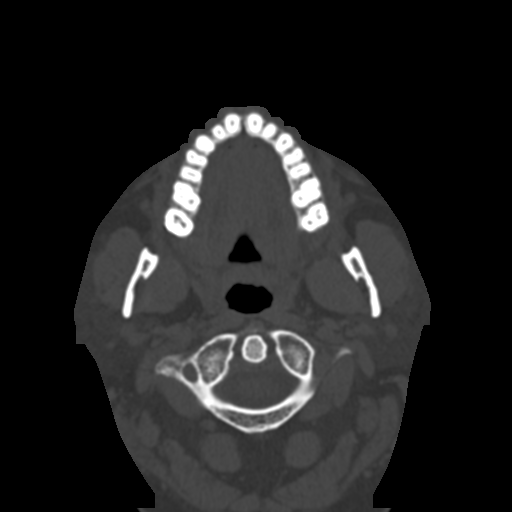
[im 13/126  bone]
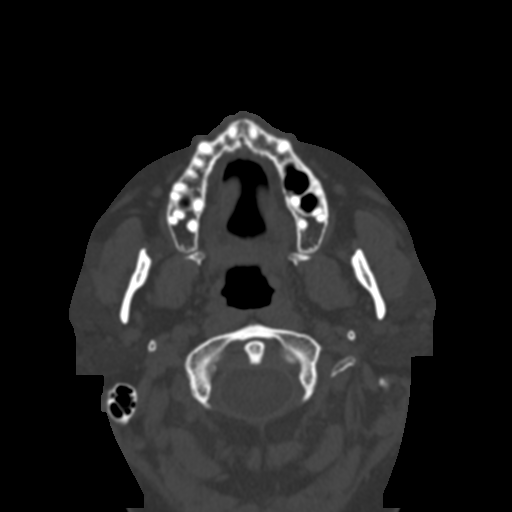
[im 22/126  bone]
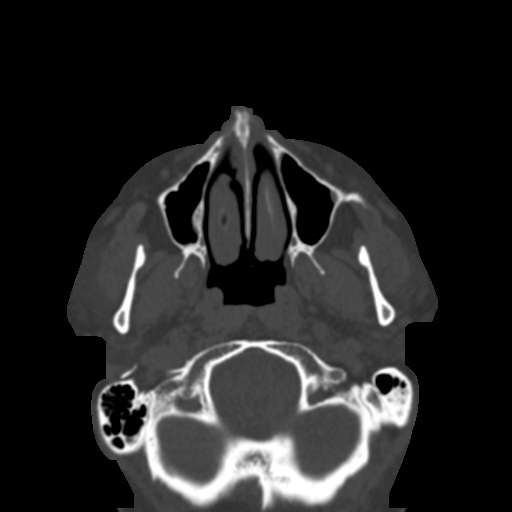
[im 31/126  bone]
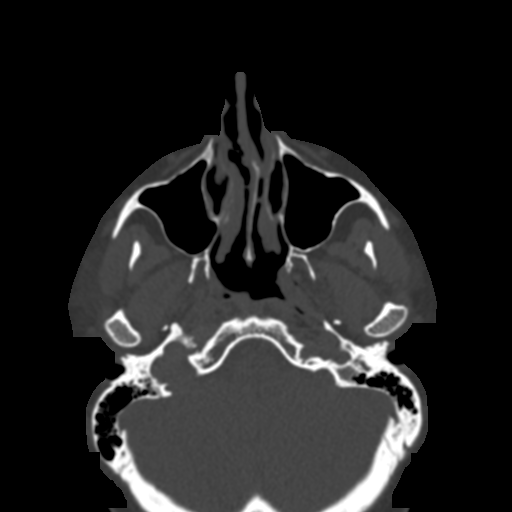
[im 39/126  brain]
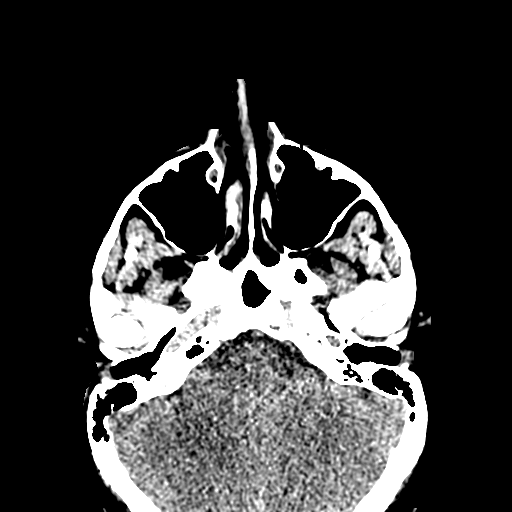
[im 39/126  bone]
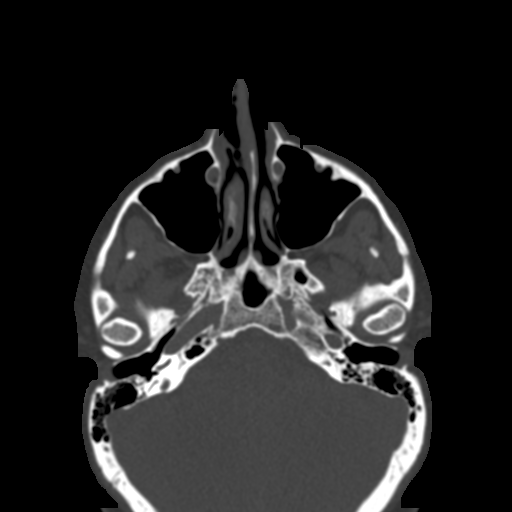
[im 48/126  bone]
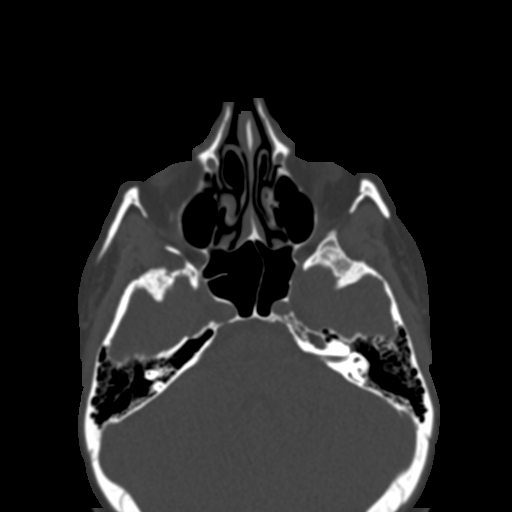
[im 57/126  bone]
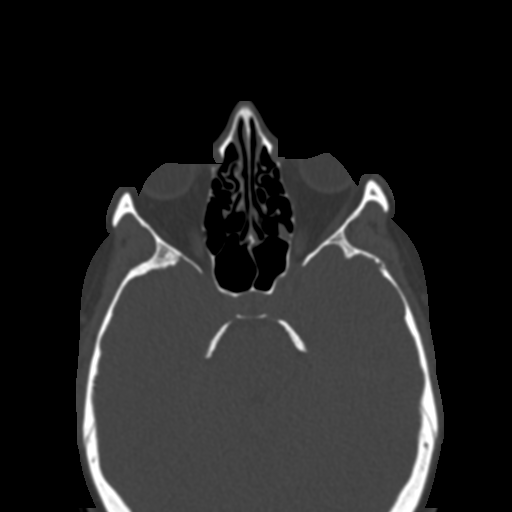
[im 65/126  bone]
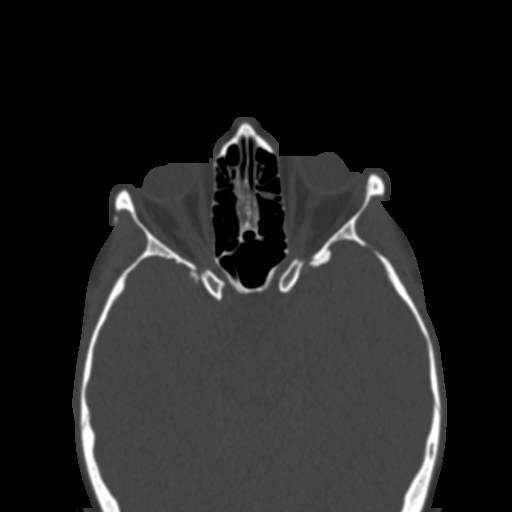
[im 69/126  brain]
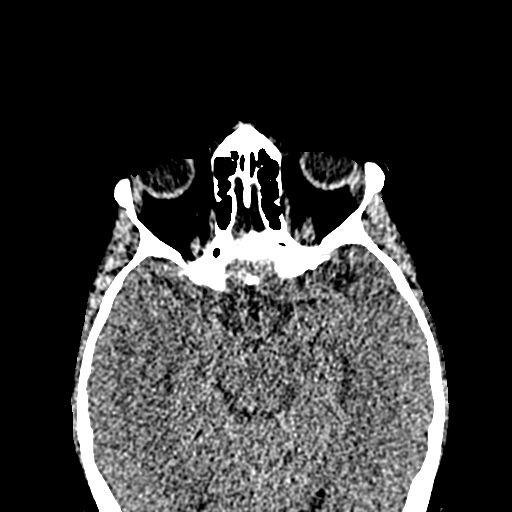
[im 69/126  bone]
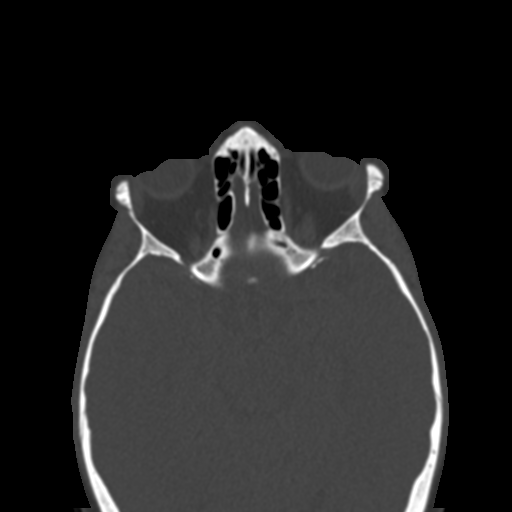
[im 78/126  bone]
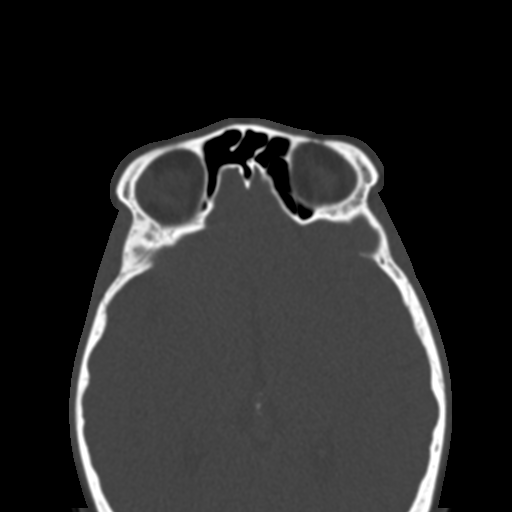
[im 87/126  bone]
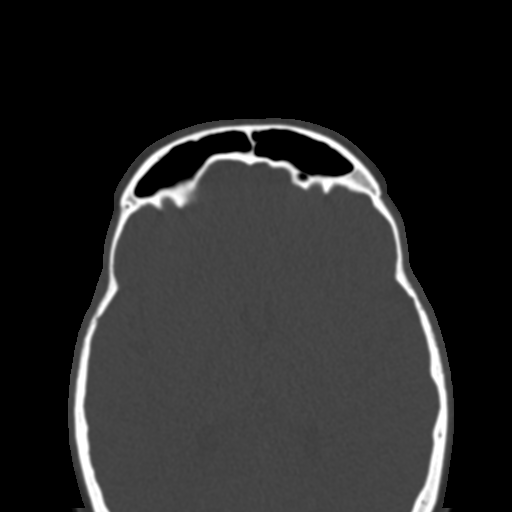
[im 95/126  bone]
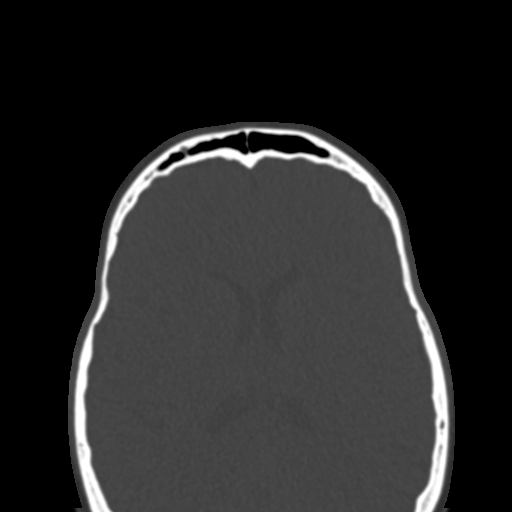
[im 104/126  brain]
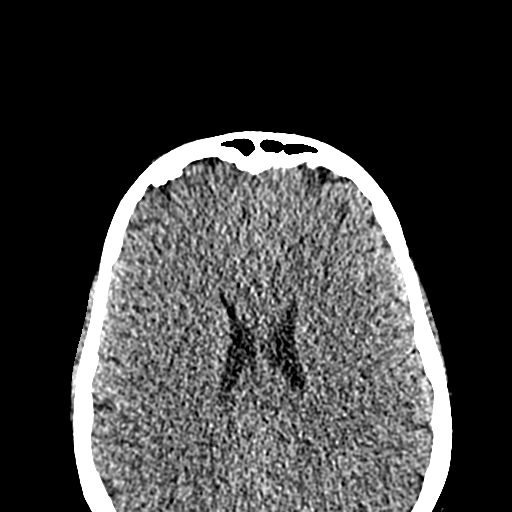
[im 104/126  bone]
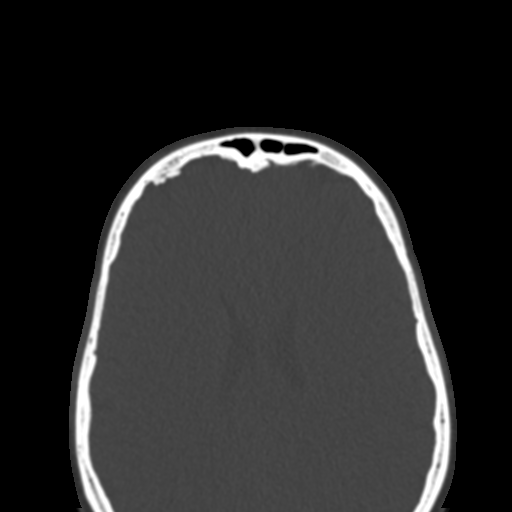
[im 113/126  bone]
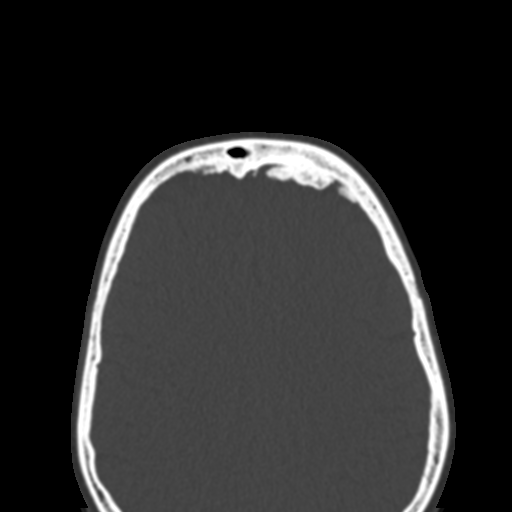
[im 121/126  bone]
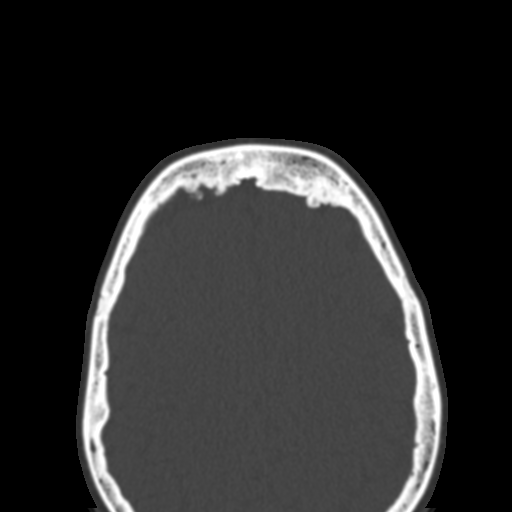

[15 of 30 positions shown; findings below may reference images not displayed]

FINDINGS: Paranasal sinuses:

Frontal: Normally aerated. Patent frontal sinus drainage pathways.

Ethmoid: Mild mid and posterior ethmoid air cell mucosal thickening
on the left.

Maxillary: Trace mucosal thickening bilaterally. No fluid.

Sphenoid: Minimal mucosal thickening in the left sphenoid sinus near
the ostium. Patent sphenoethmoidal recesses.

Right ostiomeatal unit: Patent.

Left ostiomeatal unit: Patent.

Nasal passages: Small volume mucous or other debris medial and
anterior to the right inferior nasal turbinate. Right larger than
left concha bullosa. Intact nasal septum with 5 mm of leftward
deviation and septal spurring contacting the left inferior nasal
turbinate.

Anatomy: Pneumatization superior to the anterior ethmoid notches
bilaterally. Intact olfactory grooves and fovea ethmoidalis, Keros
II. Sellar sphenoid pneumatization pattern. No dehiscence of carotid
or optic canals. No onodi cell.

Other: Clear mastoid air cells and tympanic cavities. Unremarkable
appearance of the orbits and included portion of the brain.
IMPRESSION: 1. Minimal scattered paranasal sinus mucosal thickening. No fluid.
2. Leftward nasal septal deviation and spurring.

## 2021-04-13 ENCOUNTER — Other Ambulatory Visit: Payer: Self-pay | Admitting: Family Medicine

## 2021-04-13 DIAGNOSIS — R600 Localized edema: Secondary | ICD-10-CM

## 2021-04-22 ENCOUNTER — Telehealth: Payer: Self-pay | Admitting: Internal Medicine

## 2021-04-22 NOTE — Telephone Encounter (Signed)
Left voicemail for patient to call back to schedule f/u. 

## 2021-04-22 NOTE — Telephone Encounter (Signed)
Patient is overdue for follow up for asthma.

## 2021-05-03 ENCOUNTER — Other Ambulatory Visit: Payer: Self-pay | Admitting: Family Medicine

## 2021-05-03 DIAGNOSIS — I10 Essential (primary) hypertension: Secondary | ICD-10-CM

## 2021-05-04 ENCOUNTER — Other Ambulatory Visit: Payer: Self-pay | Admitting: Family Medicine

## 2021-05-04 DIAGNOSIS — N179 Acute kidney failure, unspecified: Secondary | ICD-10-CM | POA: Diagnosis not present

## 2021-05-04 DIAGNOSIS — N6310 Unspecified lump in the right breast, unspecified quadrant: Secondary | ICD-10-CM

## 2021-05-04 DIAGNOSIS — R809 Proteinuria, unspecified: Secondary | ICD-10-CM | POA: Diagnosis not present

## 2021-05-04 DIAGNOSIS — I10 Essential (primary) hypertension: Secondary | ICD-10-CM | POA: Diagnosis not present

## 2021-05-04 DIAGNOSIS — M3130 Wegener's granulomatosis without renal involvement: Secondary | ICD-10-CM | POA: Diagnosis not present

## 2021-05-05 ENCOUNTER — Encounter: Payer: Self-pay | Admitting: Internal Medicine

## 2021-05-05 ENCOUNTER — Other Ambulatory Visit: Payer: Self-pay

## 2021-05-05 ENCOUNTER — Ambulatory Visit: Payer: BC Managed Care – PPO | Admitting: Internal Medicine

## 2021-05-05 VITALS — BP 122/78 | HR 83 | Temp 98.0°F | Ht 67.0 in | Wt 295.6 lb

## 2021-05-05 DIAGNOSIS — J4550 Severe persistent asthma, uncomplicated: Secondary | ICD-10-CM

## 2021-05-05 MED ORDER — BUDESONIDE-FORMOTEROL FUMARATE 160-4.5 MCG/ACT IN AERO: 2.0000 | INHALATION_SPRAY | Freq: Two times a day (BID) | RESPIRATORY_TRACT | 5 refills | Status: DC

## 2021-05-05 NOTE — Progress Notes (Signed)
Diane Wells    [id number]    04/08/1980  Primary Care Physician:Copland, Gay Filler, MD Date of Appointment: 05/05/2021 Established Patient Visit  Chief complaint:   Chief Complaint  Patient presents with   Follow-up    Asthma     HPI: Diane Wells is a 41 year old woman with granulomatosis with polyangiitis.  I have detailed her asthma history below.  Her more recent history begins in the last year when she developed arthralgias and neuropathy as well as recurrent sinus issues in early 2020.    [facility] history: Dr. Trudie Reed at [facility] Rexene Alberts in Neurology Dr. 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 Johnney Ou. Biopsy deferred due to presumptive diagnosis ultrasound findings, and proteinuria and hematuria.  Interval Updates: Here for follow up after several months. On cell cept 1500 mg BID, plaquenil 200 mg daily for RA. tapered Prednisone and ended up having adrenal insufficiency - now on hydrocortisone 5 mg daily. No more prednisone.   Asthma wise she is feels she is doing well. She is short of breath but feels it's related to nasal congestion. Dyspnea is with exertion, not at rest. Walking from the lobby exam room she was a little short of breath but recovers after a minute.   Taking albuterol prn before exercise such as grocery store or walking dog - this does seem to help.   She has been trying to lose weight - lost 15 lbs in the last month and a half.   She has had 3 sinus infections since November and cannot breath through her nose. Still on flonase, astelin dried her out too much and she got epistaxis. Used to see Dr. Benjamine Mola. Going to urgent care for sinus infections - currently on doxycycline.   Insurance didn't cover Ruthe Mannan - has been on symbicort and its working well for her.   Tried omeprazole but got stomach cramps on this and couldn't take it with cell cept.  Stopped taking it. Cough improved on its own.   Current Regimen: Symbicort 160 2 puffs BID. Albuterol MDI (allergic to nebulizer solution) using 2-3 times/week.  Asthma Triggers:seasonal and environmental allergies Exacerbations in the last year: 0 for asthma in 2022. History of hospitalization or intubation: none, ED visit in Feb 2021 Hives: anaphylaxis  Allergy Testing: Had SPT testing 2018 years ago: grass, tree, mold, feather, cat dust mites. Wasn't able to keep up with SCIT in the past due to schedules  GERD: denies Allergic Rhinitis: yes ACT:  Asthma Control Test ACT Total Score  05/05/2021 15  05/13/2019 18  02/15/2016 12   FeNO: 24 ppb while on high dose prednisone  I have reviewed the patient's family social and past medical history and updated as appropriate.   Past Medical History:  Diagnosis Date   Allergic rhinitis    Asthma    Chronic kidney disease    Granulomatosis with polyangiitis (HCC)    Hypertension    Recurrent upper respiratory infection (URI)     Past Surgical History:  Procedure Laterality Date   ADENOIDECTOMY     ANTERIOR CERVICAL DECOMP/DISCECTOMY FUSION      Family History  Problem Relation Age of Onset   Diabetes Mother        Type 1 Diabetes, juvenile onset   Hypertension Mother    Pulmonary Hypertension Mother    Hyperthyroidism Mother    Glaucoma Mother    Neuropathy  Mother    Sjogren's syndrome Mother    Carpal tunnel syndrome Mother    Hypertension Father    Gallbladder disease Father    Hypertension Maternal Grandmother    Glaucoma Maternal Grandmother    Coronary artery disease Maternal Grandmother    Heart attack Maternal Grandmother    Angina Maternal Grandmother    Macular degeneration Maternal Grandmother    COPD Maternal Grandmother    Congestive Heart Failure Maternal Grandmother    Glaucoma Maternal Grandfather    Leukemia Paternal Grandmother    Hypertension Paternal Grandmother    Colon cancer Paternal Grandfather     Diabetes Maternal Uncle    Heart attack Maternal Uncle    Stroke Maternal Uncle    Allergic rhinitis Neg Hx    Angioedema Neg Hx    Asthma Neg Hx    Eczema Neg Hx     Social History   Occupational History   Not on file  Tobacco Use   Smoking status: Never   Smokeless tobacco: Never  Vaping Use   Vaping Use: Never used  Substance and Sexual Activity   Alcohol use: Yes    Comment: Rare   Drug use: No   Sexual activity: Not on file     Physical Exam: Blood pressure 122/78, pulse 83, temperature 98 F (36.7 C), temperature source Oral, height 5\' 7"  (1.702 m), weight 295 lb 9.6 oz (134.1 kg), SpO2 100 %.  Gen:      No acute distress ENT:   Mmm no nasal polyps,  Lungs:    ctab  no wheezes or crackles CV:         RRR, no mrg MSK:     No rashes  Data Reviewed: Imaging: I have personally reviewed the chest xray July 2021 no acute cardiopulmonary process  PFTs:   PFT Results Latest Ref Rng & Units 06/16/2020 06/03/2019  FVC-Pre L 3.38 3.87  FVC-Predicted Pre % 85 97  FVC-Post L 2.92 3.42  FVC-Predicted Post % 73 86  Pre FEV1/FVC % % 86 84  Post FEV1/FCV % % 91 88  FEV1-Pre L 2.92 3.27  FEV1-Predicted Pre % 90 100  FEV1-Post L 2.65 3.00  DLCO uncorrected ml/min/mmHg - 18.88  DLCO UNC% % - 80  DLCO corrected ml/min/mmHg - 20.57  DLCO COR %Predicted % - 87  DLVA Predicted % - 95  TLC L - 5.28  TLC % Predicted % - 98  RV % Predicted % - 86   I have personally reviewed the patient's PFTs and they are without airflow limitation, have normal lung volumes, and normal diffusion capacity. No significant BD response. Feno is 24ppb which is not elevated.   Labs: Lab Results  Component Value Date   WBC 13.6 (H) 05/23/2019   HGB 11.0 (L) 05/23/2019   HCT 33.5 (L) 05/23/2019   MCV 99.4 05/23/2019   PLT 210 05/23/2019   Lab Results  Component Value Date   NA 136 10/20/2020   K 4.0 10/20/2020   CL 100 10/20/2020   CO2 25 10/20/2020   Cr 2/25 shows Cr 1.03.    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  Severe Persistent Allergic Asthma Allergic Rhinitis Nasal congestion  Plan/Recommendations: Continue Symbicort 2 puffs BID.  Continue prn albuterol  Continue fluticasone Follow up with Dr. Benjamine Mola -   Return to Care: Return in about 6 months (around 11/02/2021).   Lenice Llamas, MD Pulmonary and [place]

## 2021-05-05 NOTE — Patient Instructions (Addendum)
Please schedule follow up scheduled with myself in 6 months.  If my schedule is not open yet, we will contact you with a reminder closer to that time. Please call [phone] if you haven't heard from [place] a month before.    Continue symbicort and as needed albuterol.  Follow up with Dr. Suszanne Conners for your sinus issues.

## 2021-05-11 DIAGNOSIS — M0579 Rheumatoid arthritis with rheumatoid factor of multiple sites without organ or systems involvement: Secondary | ICD-10-CM | POA: Diagnosis not present

## 2021-05-11 DIAGNOSIS — M2550 Pain in unspecified joint: Secondary | ICD-10-CM | POA: Diagnosis not present

## 2021-05-11 DIAGNOSIS — H15009 Unspecified scleritis, unspecified eye: Secondary | ICD-10-CM | POA: Diagnosis not present

## 2021-05-11 DIAGNOSIS — M3130 Wegener's granulomatosis without renal involvement: Secondary | ICD-10-CM | POA: Diagnosis not present

## 2021-05-12 DIAGNOSIS — E242 Drug-induced Cushing's syndrome: Secondary | ICD-10-CM | POA: Diagnosis not present

## 2021-05-12 DIAGNOSIS — E2749 Other adrenocortical insufficiency: Secondary | ICD-10-CM | POA: Diagnosis not present

## 2021-05-25 ENCOUNTER — Other Ambulatory Visit: Payer: Self-pay

## 2021-05-25 ENCOUNTER — Ambulatory Visit: Payer: BC Managed Care – PPO

## 2021-05-25 ENCOUNTER — Other Ambulatory Visit: Payer: Self-pay | Admitting: Family Medicine

## 2021-05-25 ENCOUNTER — Ambulatory Visit
Admission: RE | Admit: 2021-05-25 | Discharge: 2021-05-25 | Disposition: A | Discharge status: Home / Self Care | Payer: BC Managed Care – PPO | Source: Ambulatory Visit | Attending: Family Medicine | Admitting: Family Medicine

## 2021-05-25 DIAGNOSIS — N6310 Unspecified lump in the right breast, unspecified quadrant: Secondary | ICD-10-CM

## 2021-05-25 DIAGNOSIS — R922 Inconclusive mammogram: Secondary | ICD-10-CM | POA: Diagnosis not present

## 2021-05-26 DIAGNOSIS — H40041 Steroid responder, right eye: Secondary | ICD-10-CM | POA: Diagnosis not present

## 2021-05-26 DIAGNOSIS — H15013 Anterior scleritis, bilateral: Secondary | ICD-10-CM | POA: Diagnosis not present

## 2021-05-26 DIAGNOSIS — Z79899 Other long term (current) drug therapy: Secondary | ICD-10-CM | POA: Diagnosis not present

## 2021-05-31 DIAGNOSIS — J060 Acute laryngopharyngitis: Secondary | ICD-10-CM | POA: Diagnosis not present

## 2021-05-31 DIAGNOSIS — E2749 Other adrenocortical insufficiency: Secondary | ICD-10-CM | POA: Diagnosis present

## 2021-05-31 DIAGNOSIS — I10 Essential (primary) hypertension: Secondary | ICD-10-CM | POA: Diagnosis present

## 2021-05-31 DIAGNOSIS — J0141 Acute recurrent pansinusitis: Secondary | ICD-10-CM | POA: Diagnosis not present

## 2021-05-31 DIAGNOSIS — L049 Acute lymphadenitis, unspecified: Secondary | ICD-10-CM | POA: Diagnosis not present

## 2021-05-31 DIAGNOSIS — R509 Fever, unspecified: Secondary | ICD-10-CM | POA: Diagnosis not present

## 2021-05-31 DIAGNOSIS — E242 Drug-induced Cushing's syndrome: Secondary | ICD-10-CM | POA: Insufficient documentation

## 2021-06-03 ENCOUNTER — Telehealth: Payer: Self-pay

## 2021-06-03 ENCOUNTER — Inpatient Hospital Stay (HOSPITAL_BASED_OUTPATIENT_CLINIC_OR_DEPARTMENT_OTHER)
Admission: EM | Admit: 2021-06-03 | Discharge: 2021-06-10 | DRG: 809 | Disposition: A | Discharge status: Home / Self Care | Payer: BC Managed Care – PPO | Source: Ambulatory Visit | Attending: Internal Medicine | Admitting: Internal Medicine

## 2021-06-03 ENCOUNTER — Other Ambulatory Visit: Payer: Self-pay

## 2021-06-03 ENCOUNTER — Encounter: Payer: Self-pay | Admitting: Family Medicine

## 2021-06-03 ENCOUNTER — Other Ambulatory Visit: Payer: BC Managed Care – PPO

## 2021-06-03 ENCOUNTER — Emergency Department (HOSPITAL_BASED_OUTPATIENT_CLINIC_OR_DEPARTMENT_OTHER): Payer: BC Managed Care – PPO

## 2021-06-03 ENCOUNTER — Ambulatory Visit (INDEPENDENT_AMBULATORY_CARE_PROVIDER_SITE_OTHER): Payer: BC Managed Care – PPO | Admitting: Family Medicine

## 2021-06-03 ENCOUNTER — Encounter (HOSPITAL_BASED_OUTPATIENT_CLINIC_OR_DEPARTMENT_OTHER): Payer: Self-pay

## 2021-06-03 VITALS — BP 110/80 | HR 85 | Temp 97.8°F | Resp 18 | Ht 67.0 in | Wt 293.6 lb

## 2021-06-03 DIAGNOSIS — R591 Generalized enlarged lymph nodes: Secondary | ICD-10-CM

## 2021-06-03 DIAGNOSIS — K1379 Other lesions of oral mucosa: Secondary | ICD-10-CM

## 2021-06-03 DIAGNOSIS — M3130 Wegener's granulomatosis without renal involvement: Secondary | ICD-10-CM | POA: Diagnosis not present

## 2021-06-03 DIAGNOSIS — Z823 Family history of stroke: Secondary | ICD-10-CM

## 2021-06-03 DIAGNOSIS — N189 Chronic kidney disease, unspecified: Secondary | ICD-10-CM | POA: Diagnosis not present

## 2021-06-03 DIAGNOSIS — Z806 Family history of leukemia: Secondary | ICD-10-CM

## 2021-06-03 DIAGNOSIS — N83201 Unspecified ovarian cyst, right side: Secondary | ICD-10-CM

## 2021-06-03 DIAGNOSIS — K6389 Other specified diseases of intestine: Secondary | ICD-10-CM | POA: Diagnosis not present

## 2021-06-03 DIAGNOSIS — R599 Enlarged lymph nodes, unspecified: Secondary | ICD-10-CM | POA: Diagnosis not present

## 2021-06-03 DIAGNOSIS — M0500 Felty's syndrome, unspecified site: Secondary | ICD-10-CM | POA: Diagnosis not present

## 2021-06-03 DIAGNOSIS — M0520 Rheumatoid vasculitis with rheumatoid arthritis of unspecified site: Secondary | ICD-10-CM | POA: Diagnosis not present

## 2021-06-03 DIAGNOSIS — Z8249 Family history of ischemic heart disease and other diseases of the circulatory system: Secondary | ICD-10-CM | POA: Diagnosis not present

## 2021-06-03 DIAGNOSIS — D709 Neutropenia, unspecified: Secondary | ICD-10-CM | POA: Diagnosis not present

## 2021-06-03 DIAGNOSIS — Z6841 Body Mass Index (BMI) 40.0 and over, adult: Secondary | ICD-10-CM

## 2021-06-03 DIAGNOSIS — D696 Thrombocytopenia, unspecified: Secondary | ICD-10-CM | POA: Diagnosis not present

## 2021-06-03 DIAGNOSIS — I129 Hypertensive chronic kidney disease with stage 1 through stage 4 chronic kidney disease, or unspecified chronic kidney disease: Secondary | ICD-10-CM | POA: Diagnosis not present

## 2021-06-03 DIAGNOSIS — E010 Iodine-deficiency related diffuse (endemic) goiter: Secondary | ICD-10-CM | POA: Diagnosis not present

## 2021-06-03 DIAGNOSIS — Z833 Family history of diabetes mellitus: Secondary | ICD-10-CM | POA: Diagnosis not present

## 2021-06-03 DIAGNOSIS — Z79899 Other long term (current) drug therapy: Secondary | ICD-10-CM

## 2021-06-03 DIAGNOSIS — E2749 Other adrenocortical insufficiency: Secondary | ICD-10-CM | POA: Diagnosis not present

## 2021-06-03 DIAGNOSIS — Z79624 Long term (current) use of inhibitors of nucleotide synthesis: Secondary | ICD-10-CM

## 2021-06-03 DIAGNOSIS — K121 Other forms of stomatitis: Secondary | ICD-10-CM | POA: Diagnosis not present

## 2021-06-03 DIAGNOSIS — Z800 Family history of malignant neoplasm of digestive organs: Secondary | ICD-10-CM | POA: Diagnosis not present

## 2021-06-03 DIAGNOSIS — Z825 Family history of asthma and other chronic lower respiratory diseases: Secondary | ICD-10-CM

## 2021-06-03 DIAGNOSIS — R221 Localized swelling, mass and lump, neck: Secondary | ICD-10-CM | POA: Diagnosis not present

## 2021-06-03 DIAGNOSIS — R161 Splenomegaly, not elsewhere classified: Secondary | ICD-10-CM | POA: Diagnosis not present

## 2021-06-03 DIAGNOSIS — K1239 Other oral mucositis (ulcerative): Secondary | ICD-10-CM | POA: Diagnosis present

## 2021-06-03 DIAGNOSIS — R5081 Fever presenting with conditions classified elsewhere: Secondary | ICD-10-CM | POA: Diagnosis not present

## 2021-06-03 DIAGNOSIS — I10 Essential (primary) hypertension: Secondary | ICD-10-CM | POA: Diagnosis not present

## 2021-06-03 DIAGNOSIS — D638 Anemia in other chronic diseases classified elsewhere: Secondary | ICD-10-CM

## 2021-06-03 DIAGNOSIS — J4540 Moderate persistent asthma, uncomplicated: Secondary | ICD-10-CM | POA: Diagnosis not present

## 2021-06-03 DIAGNOSIS — D708 Other neutropenia: Secondary | ICD-10-CM | POA: Diagnosis not present

## 2021-06-03 DIAGNOSIS — Z20822 Contact with and (suspected) exposure to covid-19: Secondary | ICD-10-CM | POA: Diagnosis present

## 2021-06-03 DIAGNOSIS — R6510 Systemic inflammatory response syndrome (SIRS) of non-infectious origin without acute organ dysfunction: Secondary | ICD-10-CM | POA: Diagnosis not present

## 2021-06-03 DIAGNOSIS — Z7951 Long term (current) use of inhaled steroids: Secondary | ICD-10-CM

## 2021-06-03 DIAGNOSIS — R509 Fever, unspecified: Secondary | ICD-10-CM | POA: Diagnosis not present

## 2021-06-03 DIAGNOSIS — D72819 Decreased white blood cell count, unspecified: Secondary | ICD-10-CM | POA: Diagnosis not present

## 2021-06-03 DIAGNOSIS — E041 Nontoxic single thyroid nodule: Secondary | ICD-10-CM

## 2021-06-03 DIAGNOSIS — D849 Immunodeficiency, unspecified: Secondary | ICD-10-CM

## 2021-06-03 DIAGNOSIS — G8929 Other chronic pain: Secondary | ICD-10-CM | POA: Diagnosis not present

## 2021-06-03 DIAGNOSIS — Z9225 Personal history of immunosupression therapy: Secondary | ICD-10-CM

## 2021-06-03 DIAGNOSIS — K449 Diaphragmatic hernia without obstruction or gangrene: Secondary | ICD-10-CM | POA: Diagnosis not present

## 2021-06-03 DIAGNOSIS — D7589 Other specified diseases of blood and blood-forming organs: Secondary | ICD-10-CM | POA: Diagnosis not present

## 2021-06-03 LAB — CBC WITH DIFFERENTIAL/PLATELET
Abs Immature Granulocytes: 0 10*3/uL (ref 0.00–0.07)
Basophils Absolute: 0 10*3/uL (ref 0.0–0.1)
Basophils Absolute: 0 10*3/uL (ref 0.0–0.1)
Basophils Relative: 0.4 % (ref 0.0–3.0)
Basophils Relative: 0 %
Eosinophils Absolute: 0.2 10*3/uL (ref 0.0–0.7)
Eosinophils Absolute: 0.1 10*3/uL (ref 0.0–0.5)
Eosinophils Relative: 10 % — ABNORMAL HIGH (ref 0.0–5.0)
Eosinophils Relative: 9 %
HCT: 37.5 % (ref 36.0–46.0)
HCT: 41 % (ref 36.0–46.0)
Hemoglobin: 12.3 g/dL (ref 12.0–15.0)
Hemoglobin: 13.3 g/dL (ref 12.0–15.0)
Immature Granulocytes: 0 %
Lymphocytes Relative: 52.6 % — ABNORMAL HIGH (ref 12.0–46.0)
Lymphocytes Relative: 61 %
Lymphs Abs: 0.9 10*3/uL (ref 0.7–4.0)
Lymphs Abs: 0.9 10*3/uL (ref 0.7–4.0)
MCH: 27.5 pg (ref 26.0–34.0)
MCHC: 32.8 g/dL (ref 30.0–36.0)
MCHC: 32.4 g/dL (ref 30.0–36.0)
MCV: 83.6 fl (ref 78.0–100.0)
MCV: 84.9 fL (ref 80.0–100.0)
Monocytes Absolute: 0.6 10*3/uL (ref 0.1–1.0)
Monocytes Absolute: 0.4 10*3/uL (ref 0.1–1.0)
Monocytes Relative: 36.5 % — ABNORMAL HIGH (ref 3.0–12.0)
Monocytes Relative: 29 %
Neutro Abs: 0 10*3/uL — ABNORMAL LOW (ref 1.4–7.7)
Neutro Abs: 0 10*3/uL — CL (ref 1.7–7.7)
Neutrophils Relative %: 0.5 % — ABNORMAL LOW (ref 43.0–77.0)
Neutrophils Relative %: 1 %
Platelets: 239 10*3/uL (ref 150.0–400.0)
Platelets: 254 10*3/uL (ref 150–400)
RBC: 4.48 Mil/uL (ref 3.87–5.11)
RBC: 4.83 MIL/uL (ref 3.87–5.11)
RDW: 14.6 % (ref 11.5–15.5)
RDW: 13.5 % (ref 11.5–15.5)
WBC: 1.8 10*3/uL — CL (ref 4.0–10.5)
WBC: 1.5 10*3/uL — ABNORMAL LOW (ref 4.0–10.5)
nRBC: 0 % (ref 0.0–0.2)

## 2021-06-03 LAB — COMPREHENSIVE METABOLIC PANEL
ALT: 9 U/L (ref 0–35)
AST: 11 U/L (ref 0–37)
Albumin: 3.7 g/dL (ref 3.5–5.2)
Alkaline Phosphatase: 42 U/L (ref 39–117)
BUN: 15 mg/dL (ref 6–23)
CO2: 27 mEq/L (ref 19–32)
Calcium: 8.6 mg/dL (ref 8.4–10.5)
Chloride: 100 mEq/L (ref 96–112)
Creatinine, Ser: 1.2 mg/dL (ref 0.40–1.20)
GFR: 56.67 mL/min — ABNORMAL LOW (ref >60.00)
Glucose, Bld: 94 mg/dL (ref 70–99)
Potassium: 3.7 mEq/L (ref 3.5–5.1)
Sodium: 137 mEq/L (ref 135–145)
Total Bilirubin: 0.3 mg/dL (ref 0.2–1.2)
Total Protein: 6.3 g/dL (ref 6.0–8.3)

## 2021-06-03 LAB — HEPATIC FUNCTION PANEL
ALT: 12 U/L (ref 0–44)
AST: 14 U/L — ABNORMAL LOW (ref 15–41)
Albumin: 3.7 g/dL (ref 3.5–5.0)
Alkaline Phosphatase: 45 U/L (ref 38–126)
Bilirubin, Direct: 0.1 mg/dL (ref 0.0–0.2)
Indirect Bilirubin: 0.4 mg/dL (ref 0.3–0.9)
Total Bilirubin: 0.5 mg/dL (ref 0.3–1.2)
Total Protein: 7.3 g/dL (ref 6.5–8.1)

## 2021-06-03 LAB — URINALYSIS, ROUTINE W REFLEX MICROSCOPIC
Bilirubin Urine: NEGATIVE
Glucose, UA: NEGATIVE mg/dL
Ketones, ur: NEGATIVE mg/dL
Leukocytes,Ua: NEGATIVE
Nitrite: NEGATIVE
Protein, ur: NEGATIVE mg/dL
Specific Gravity, Urine: 1.025 (ref 1.005–1.030)
pH: 5.5 (ref 5.0–8.0)

## 2021-06-03 LAB — RESP PANEL BY RT-PCR (FLU A&B, COVID) ARPGX2
Influenza A by PCR: NEGATIVE
Influenza B by PCR: NEGATIVE
SARS Coronavirus 2 by RT PCR: NEGATIVE

## 2021-06-03 LAB — BASIC METABOLIC PANEL
Anion gap: 9 (ref 5–15)
BUN: 15 mg/dL (ref 6–20)
CO2: 25 mmol/L (ref 22–32)
Calcium: 8.6 mg/dL — ABNORMAL LOW (ref 8.9–10.3)
Chloride: 101 mmol/L (ref 98–111)
Creatinine, Ser: 1.29 mg/dL — ABNORMAL HIGH (ref 0.44–1.00)
GFR, Estimated: 54 mL/min — ABNORMAL LOW (ref >60)
Glucose, Bld: 100 mg/dL — ABNORMAL HIGH (ref 70–99)
Potassium: 4.2 mmol/L (ref 3.5–5.1)
Sodium: 135 mmol/L (ref 135–145)

## 2021-06-03 LAB — URINALYSIS, MICROSCOPIC (REFLEX): WBC, UA: NONE SEEN WBC/hpf (ref 0–5)

## 2021-06-03 LAB — SEDIMENTATION RATE: Sed Rate: 25 mm/hr — ABNORMAL HIGH (ref 0–22)

## 2021-06-03 LAB — MONONUCLEOSIS SCREEN
Mono Screen: NEGATIVE
Mono Screen: NEGATIVE

## 2021-06-03 LAB — C-REACTIVE PROTEIN: CRP: 14.2 mg/dL — ABNORMAL HIGH (ref <1.0)

## 2021-06-03 LAB — LACTIC ACID, PLASMA: Lactic Acid, Venous: 1.3 mmol/L (ref 0.5–1.9)

## 2021-06-03 MED ORDER — LOSARTAN POTASSIUM 50 MG PO TABS
75.0000 mg | ORAL_TABLET | Freq: Every day | ORAL | Status: DC
  Administered 2021-06-03: 22:00:00 75 mg via ORAL
  Filled 2021-06-03: qty 3

## 2021-06-03 MED ORDER — PREDNISONE 10 MG PO TABS: ORAL_TABLET | ORAL | 0 refills | Status: DC

## 2021-06-03 MED ORDER — METHYLPREDNISOLONE ACETATE 40 MG/ML IJ SUSP
40.0000 mg | Freq: Once | INTRAMUSCULAR | Status: AC
  Administered 2021-06-03: 40 mg via INTRAMUSCULAR

## 2021-06-03 MED ORDER — CEFDINIR 300 MG PO CAPS: 300.0000 mg | ORAL_CAPSULE | Freq: Two times a day (BID) | ORAL | 0 refills | Status: DC

## 2021-06-03 MED ORDER — OXYCODONE-ACETAMINOPHEN 5-325 MG PO TABS
1.0000 | ORAL_TABLET | Freq: Four times a day (QID) | ORAL | Status: DC | PRN
  Administered 2021-06-03: 22:00:00 1 via ORAL
  Administered 2021-06-04 – 2021-06-10 (×20): 2 via ORAL
  Filled 2021-06-03 (×11): qty 2
  Filled 2021-06-03: qty 1
  Filled 2021-06-03 (×9): qty 2

## 2021-06-03 MED ORDER — SODIUM CHLORIDE 0.9 % IV SOLN
2.0000 g | Freq: Once | INTRAVENOUS | Status: AC
  Administered 2021-06-03: 2 g via INTRAVENOUS
  Filled 2021-06-03: qty 2

## 2021-06-03 MED ORDER — MAGIC MOUTHWASH W/LIDOCAINE: 5.0000 mL | Freq: Four times a day (QID) | ORAL | 0 refills | Status: DC | PRN

## 2021-06-03 MED ORDER — ALBUTEROL SULFATE HFA 108 (90 BASE) MCG/ACT IN AERS: 2.0000 | INHALATION_SPRAY | Freq: Once | RESPIRATORY_TRACT | Status: DC | PRN

## 2021-06-03 NOTE — ED Provider Notes (Incomplete)
[facility] EMERGENCY DEPARTMENT Provider Note   CSN: [id number] Arrival date & time: 06/03/21  1649     History {Add pertinent medical, surgical, social history, OB history to HPI:1} Chief Complaint  Patient presents with   Abnormal Lab    Diane Wells is a 41 y.o. female.  HPI Patient with history of granulomatosis with polyangiitis, rheumatoid, iatrogenic Cushing's syndrome as well as severe asthma.  Regular medications include CellCept and Plaquenil.  Patient reports that she is typically been well controlled for a while without any abnormalities in her white blood cell count.  Patient reports that Monday, 5 days ago she awakened in the middle the night with chills and fever.  She reports she had some sinus congestion and pressure.  She also subsequently developed large lymph nodes on the right side of her neck.  She reports she has been treated for several sinus infections over the past few months.  Her temperature was ranging between 102 and 103.  She was seen at urgent care 3/7 and was started on Augmentin, Zyrtec-D and Hycodan syrup.  Patient reports she did not feel that she was improving and she started getting mouth lesions that are very painful.  It is decreased her ability to take oral fluids.  She was seen at her PCP office today and today and changed to [facility] and a steroid shot administered in the office with a steroid taper ordered.  Lab work was ordered.  Patient was then notified to come to the emergency department for a very low white blood cell count.    Home Medications Prior to Admission medications   Medication Sig Start Date End Date Taking? Authorizing Provider  amoxicillin-clavulanate (AUGMENTIN) 875-125 MG tablet Take by mouth. 05/31/21 06/10/21  [provider]  budesonide-formoterol (SYMBICORT) 160-4.5 MCG/ACT inhaler Inhale 2 puffs into the lungs in the morning and at bedtime. 05/05/21   Charlott Holler, MD  butalbital-acetaminophen-caffeine  (FIORICET) [phone] MG tablet Take 1 tablet by mouth every 6 (six) hours as needed for headache. 10/20/20   Copland, Gwenlyn Found, MD  cefdinir (OMNICEF) 300 MG capsule Take 1 capsule (300 mg total) by mouth 2 (two) times daily. 06/03/21   Seabron Spates R, DO  EPINEPHrine 0.3 mg/0.3 mL IJ SOAJ injection INJECT 0.3 MLS (0.3 MG TOTAL) INTO THE MUSCLE ONCE FOR 1 DOSE. 10/20/20   Copland, Gwenlyn Found, MD  fexofenadine (ALLEGRA) 180 MG tablet Take 180 mg by mouth daily.    [provider]  fluticasone (FLONASE) 50 MCG/ACT nasal spray Place 2 sprays into both nostrils in the morning and at bedtime. 01/01/20 12/31/20  Charlott Holler, MD  furosemide (LASIX) 20 MG tablet TAKE 1 (ONE) TABLET DAILY AS NEEDED Patient taking differently: Patient taking  04/13/21   Copland, Gwenlyn Found, MD  gabapentin (NEURONTIN) 300 MG capsule TAKE 1 CAPSULE BY MOUTH THREE TIMES A DAY 10/20/20   Copland, Gwenlyn Found, MD  HYDROcodone bit-homatropine (HYCODAN) 5-1.5 MG/5ML syrup Take by mouth. 05/31/21 06/05/21  [provider]  hydrocortisone (CORTEF) 10 MG tablet 10 mg Am and 5 mg PM. 10/02/20   [provider]  hydroxychloroquine (PLAQUENIL) 200 MG tablet Take 200 mg by mouth 2 (two) times daily. 11/29/20   [provider]  ketorolac (ACULAR) 0.5 % ophthalmic solution Place 1 drop into the left eye 4 (four) times daily. 11/11/20   [provider]  losartan (COZAAR) 25 MG tablet TAKE 3 TABLETS BY MOUTH DAILY 05/03/21   Copland, Shanda Bumps  C, MD  magic mouthwash w/lidocaine SOLN Take 5 mLs by mouth 4 (four) times daily as needed for mouth pain. Swish and Spit 06/03/21   Zola Button, Grayling Congress, DO  methocarbamol (ROBAXIN) 750 MG tablet TAKE 1 TABLET (750 MG TOTAL) BY MOUTH EVERY 8 (EIGHT) HOURS AS NEEDED FOR MUSCLE SPASMS. 02/10/21   Copland, Gwenlyn Found, MD  Multiple Vitamin (MULTIVITAMIN) tablet Take 1 tablet by mouth daily.    [provider]  mycophenolate (CELLCEPT) 500 MG tablet Take 1,500 mg  by mouth 2 (two) times daily. 12/12/19   [provider]  ondansetron (ZOFRAN) 8 MG tablet Take 1 tablet (8 mg total) by mouth every 8 (eight) hours as needed for nausea or vomiting. 10/20/20   Copland, Gwenlyn Found, MD  predniSONE (DELTASONE) 10 MG tablet TAKE 3 TABLETS PO QD FOR 3 DAYS THEN TAKE 2 TABLETS PO QD FOR 3 DAYS THEN TAKE 1 TABLET PO QD FOR 3 DAYS THEN TAKE 1/2 TAB PO QD FOR 3 DAYS 06/03/21   Donato Schultz, DO  PROAIR HFA 108 [phone] Base) MCG/ACT inhaler Inhale 1-2 puffs into the lungs daily as needed for shortness of breath. 06/11/19   Charlott Holler, MD  VITAMIN D PO Take by mouth.    [provider]      Allergies    Albuterol, Other, Truxima [rituximab], and Sulfa antibiotics    Review of Systems   Review of Systems 10 systems reviewed negative except as per HPI Physical Exam Updated Vital Signs BP 133/88    Pulse (!) 104    Temp 99.2 F (37.3 C) (Oral)    Resp 16    Ht 5\' 7"  (1.702 m)    Wt 133.8 kg    LMP 05/31/2021    SpO2 100%    BMI 46.20 kg/m  Physical Exam Constitutional:      Comments: Alert nontoxic clear mental status no respiratory distress  HENT:     Head: Normocephalic and atraumatic.     Mouth/Throat:     Mouth: Mucous membranes are moist.     Comments: Several ulcerative lesions on the buccal mucosa.  See attached image.  Also an ulceration and some bleeding around the gumline inferior dentition left side. Eyes:     Extraocular Movements: Extraocular movements intact.     Conjunctiva/sclera: Conjunctivae normal.     Pupils: Pupils are equal, round, and reactive to light.  Neck:     Comments: Moderate to large lymphadenopathy on the right cervical region. Cardiovascular:     Rate and Rhythm: Normal rate and regular rhythm.  Pulmonary:     Effort: Pulmonary effort is normal.     Breath sounds: Normal breath sounds.     Comments: No axillary lymphadenopathy. Abdominal:     General: There is no distension.     Palpations: Abdomen is  soft.     Tenderness: There is no abdominal tenderness. There is no guarding.  Musculoskeletal:        General: No swelling or tenderness. Normal range of motion.     Right lower leg: No edema.     Left lower leg: No edema.  Skin:    General: Skin is warm and dry.  Neurological:     General: No focal deficit present.     Mental Status: She is oriented to person, place, and time.     Motor: No weakness.     Coordination: Coordination normal.  Psychiatric:  Mood and Affect: Mood normal.     ED Results / Procedures / Treatments   Labs (all labs ordered are listed, but only abnormal results are displayed) Labs Reviewed  CBC WITH DIFFERENTIAL/PLATELET - Abnormal; Notable for the following components:      Result Value   WBC 1.5 (*)    Neutro Abs 0.0 (*)    All other components within normal limits  BASIC METABOLIC PANEL - Abnormal; Notable for the following components:   Glucose, Bld 100 (*)    Creatinine, Ser 1.29 (*)    Calcium 8.6 (*)    GFR, Estimated 54 (*)    All other components within normal limits  HEPATIC FUNCTION PANEL - Abnormal; Notable for the following components:   AST 14 (*)    All other components within normal limits  SEDIMENTATION RATE - Abnormal; Notable for the following components:   Sed Rate 25 (*)    All other components within normal limits  RESP PANEL BY RT-PCR (FLU A&B, COVID) [id number]  MONONUCLEOSIS SCREEN  C-REACTIVE PROTEIN  URINALYSIS, ROUTINE W REFLEX MICROSCOPIC  PATHOLOGIST SMEAR REVIEW    EKG None  Radiology DG Chest 2 View  Result Date: 06/03/2021 CLINICAL DATA:  Fever EXAM: CHEST - 2 VIEW COMPARISON:  10/24/2019 FINDINGS: Heart and mediastinal contours are within normal limits. No focal opacities or effusions. No acute bony abnormality. IMPRESSION: No active cardiopulmonary disease. Electronically Signed   By: Charlett NoseKevin  Dover M.D.   On: 06/03/2021 19:12    Procedures Procedures  {Document cardiac monitor, telemetry assessment  procedure when appropriate:1}  Medications Ordered in ED Medications  ceFEPIme (MAXIPIME) 2 g in sodium chloride 0.9 % 100 mL IVPB (0 g Intravenous Stopped 06/03/21 2037)    ED Course/ Medical Decision Making/ A&P                           Medical Decision Making Amount and/or Complexity of Data Reviewed Labs: ordered. Radiology: ordered.  Risk Decision regarding hospitalization.   Patient is chronically immunosuppressed on CellCept and Plaquenil.  She developed fever and chills 5 days ago.  Patient had some sinus-like symptoms but has had recurrent sinus infections and frequently treated.  She was started on Augmentin 4 days ago.  Fever has persisted and she is feeling increasingly uncomfortable with right sided cervical lymphadenopathy and aphthous ulcers developing in her mouth.  Patient was seen by PCP office today and labs revealed neutropenia.  Patient was referred to the emergency department.  Labs drawn and confirmed significant neutropenia.  Source of potential infection unclear.  Patient does not have findings of pneumonia.  She has had negative COVID and influenza testing.  Monoscreen is negative.  Chest x-ray negative.  Urinalysis is pending.  Patient does not have hypotension.  Mental status and respiratory status are clear.  Currently she is not showing sepsis physiology.  Consultation: Reviewed with Dr. Mosetta PuttFeng.  Dr. Mosetta PuttFeng advises with neutropenia initiate broad-spectrum antibiotics.  She will do consultation in the morning and decide if  {Document critical care time when appropriate:1} {Document review of labs and clinical decision tools ie heart score, Chads2Vasc2 etc:1}  {Document your independent review of radiology images, and any outside records:1} {Document your discussion with family members, caretakers, and with consultants:1} {Document social determinants of health affecting pt's care:1} {Document your decision making why or why not admission, treatments were  needed:1} Final Clinical Impression(s) / ED Diagnoses Final diagnoses:  Fever and neutropenia (HCC)  Personal history of immunosuppressive therapy    Rx / DC Orders ED Discharge Orders     None

## 2021-06-03 NOTE — Progress Notes (Addendum)
Subjective:   By signing my name below, I, Zite Okoli, attest that this documentation has been prepared under the direction and in the presence of Donato Schultz, DO. 06/03/2021    Patient ID: Diane Wells, female    DOB: 1980/11/11, 41 y.o.   MRN: [id number]  Chief Complaint  Patient presents with   Sinus Problem    Pt states going to UC on Tuesday. Pt states she was started on Augmentin. Pt states sxs hav not improved and was worse last night. Pt states running fever but controlled by ibuprofen.     HPI Patient is in today for an office visit.  She went to urgent care on 03/07 and was given Augmentin, zyrtec and hycodan. She is feeling worse today.  She reports she had sinus pressure on Monday and Tuesday that has lessened at this time. She has fluid in her ears, bilateral swollen lymph nodes, sore throat, bumps in her mouth and a swollen tongue. She has been using 800 mg ibuprofen every 6 hours  to manage the pain from the bumps and swallowing. She denies trouble breathing, rhinorrhea and cough.  She has existent sinus problems and uses allegra and Flonase daily. She adds she has lost about 6 pounds this week because she has been unable to eat and it is hard to chew. Temperature this morning was 101.6. This is her fourth sinus infection since November.   She has a history of adrenal insufficiency. Her next ENT appointment is at the end of March.  Past Medical History:  Diagnosis Date   Allergic rhinitis    Asthma    Chronic kidney disease    Granulomatosis with polyangiitis (HCC)    Hypertension    Recurrent upper respiratory infection (URI)     Past Surgical History:  Procedure Laterality Date   ADENOIDECTOMY     ANTERIOR CERVICAL DECOMP/DISCECTOMY FUSION      Family History  Problem Relation Age of Onset   Diabetes Mother        Type 1 Diabetes, juvenile onset   Hypertension Mother    Pulmonary Hypertension Mother    Hyperthyroidism Mother    Glaucoma  Mother    Neuropathy Mother    Sjogren's syndrome Mother    Carpal tunnel syndrome Mother    Hypertension Father    Gallbladder disease Father    Hypertension Maternal Grandmother    Glaucoma Maternal Grandmother    Coronary artery disease Maternal Grandmother    Heart attack Maternal Grandmother    Angina Maternal Grandmother    Macular degeneration Maternal Grandmother    COPD Maternal Grandmother    Congestive Heart Failure Maternal Grandmother    Glaucoma Maternal Grandfather    Leukemia Paternal Grandmother    Hypertension Paternal Grandmother    Colon cancer Paternal Grandfather    Diabetes Maternal Uncle    Heart attack Maternal Uncle    Stroke Maternal Uncle    Allergic rhinitis Neg Hx    Angioedema Neg Hx    Asthma Neg Hx    Eczema Neg Hx     Social History   Socioeconomic History   Marital status: Single    Spouse name: Not on file   Number of children: 0   Years of education: Not on file   Highest education level: Not on file  Occupational History   Not on file  Tobacco Use   Smoking status: Never   Smokeless tobacco: Never  Vaping Use   Vaping  Use: Never used  Substance and Sexual Activity   Alcohol use: Yes    Comment: Rare   Drug use: No   Sexual activity: Not on file  Other Topics Concern   Not on file  Social History Narrative   Not on file   Social Determinants of Health   Financial Resource Strain: Not on file  Food Insecurity: Not on file  Transportation Needs: Not on file  Physical Activity: Not on file  Stress: Not on file  Social Connections: Not on file  Intimate Partner Violence: Not on file    Outpatient Medications Prior to Visit  Medication Sig Dispense Refill   amoxicillin-clavulanate (AUGMENTIN) 875-125 MG tablet Take by mouth.     budesonide-formoterol (SYMBICORT) 160-4.5 MCG/ACT inhaler Inhale 2 puffs into the lungs in the morning and at bedtime. 1 each 5   butalbital-acetaminophen-caffeine (FIORICET) 50-325-40 MG  tablet Take 1 tablet by mouth every 6 (six) hours as needed for headache. 60 tablet 1   EPINEPHrine 0.3 mg/0.3 mL IJ SOAJ injection INJECT 0.3 MLS (0.3 MG TOTAL) INTO THE MUSCLE ONCE FOR 1 DOSE. 1 each PRN   fexofenadine (ALLEGRA) 180 MG tablet Take 180 mg by mouth daily.     furosemide (LASIX) 20 MG tablet TAKE 1 (ONE) TABLET DAILY AS NEEDED (Patient taking differently: Patient taking 40mg ) 90 tablet 0   gabapentin (NEURONTIN) 300 MG capsule TAKE 1 CAPSULE BY MOUTH THREE TIMES A DAY 90 capsule 5   HYDROcodone bit-homatropine (HYCODAN) 5-1.5 MG/5ML syrup Take by mouth.     hydrocortisone (CORTEF) 10 MG tablet 10 mg Am and 5 mg PM.     hydroxychloroquine (PLAQUENIL) 200 MG tablet Take 200 mg by mouth 2 (two) times daily.     ketorolac (ACULAR) 0.5 % ophthalmic solution Place 1 drop into the left eye 4 (four) times daily.     losartan (COZAAR) 25 MG tablet TAKE 3 TABLETS BY MOUTH DAILY 270 tablet 1   methocarbamol (ROBAXIN) 750 MG tablet TAKE 1 TABLET (750 MG TOTAL) BY MOUTH EVERY 8 (EIGHT) HOURS AS NEEDED FOR MUSCLE SPASMS. 90 tablet 3   Multiple Vitamin (MULTIVITAMIN) tablet Take 1 tablet by mouth daily.     mycophenolate (CELLCEPT) 500 MG tablet Take 1,500 mg by mouth 2 (two) times daily.     ondansetron (ZOFRAN) 8 MG tablet Take 1 tablet (8 mg total) by mouth every 8 (eight) hours as needed for nausea or vomiting. 30 tablet 2   PROAIR HFA 108 (90 Base) MCG/ACT inhaler Inhale 1-2 puffs into the lungs daily as needed for shortness of breath. 18 g 5   VITAMIN D PO Take by mouth.     fluticasone (FLONASE) 50 MCG/ACT nasal spray Place 2 sprays into both nostrils in the morning and at bedtime. 16 g 11   Facility-Administered Medications Prior to Visit  Medication Dose Route Frequency Provider Last Rate Last Admin   albuterol (VENTOLIN HFA) 108 (90 Base) MCG/ACT inhaler 2 puff  2 puff Inhalation Once PRN Zenovia JordanHawkes, Angela, MD       albuterol (VENTOLIN HFA) 108 (90 Base) MCG/ACT inhaler 2 puff  2 puff  Inhalation Once PRN Zenovia JordanHawkes, Angela, MD       cilgavimab (part of EVUSHELD) injection SOLN 300 mg  300 mg Intramuscular Once Zenovia JordanHawkes, Angela, MD       diphenhydrAMINE (BENADRYL) injection 50 mg  50 mg Intramuscular Once PRN Zenovia JordanHawkes, Angela, MD       diphenhydrAMINE (BENADRYL) injection 50 mg  50 mg  Intramuscular Once PRN Zenovia Jordan, MD       EPINEPHrine (EPI-PEN) injection 0.3 mg  0.3 mg Intramuscular Once PRN Zenovia Jordan, MD       EPINEPHrine (EPI-PEN) injection 0.3 mg  0.3 mg Intramuscular Once PRN Zenovia Jordan, MD       methylPREDNISolone sodium succinate (SOLU-MEDROL) 125 mg/2 mL injection 125 mg  125 mg Intramuscular Once PRN Zenovia Jordan, MD       methylPREDNISolone sodium succinate (SOLU-MEDROL) 125 mg/2 mL injection 125 mg  125 mg Intramuscular Once PRN Zenovia Jordan, MD       tixagevimab (part of EVUSHELD) injection SOLN 300 mg  300 mg Intramuscular Once Zenovia Jordan, MD        Allergies  Allergen Reactions   Albuterol Other (See Comments) and Shortness Of Breath   Other Anaphylaxis    Essential Oil, scent unknown, caused tongue swelling   Truxima [Rituximab] Anaphylaxis   Penicillins Hives   Sulfa Antibiotics     Review of Systems  Constitutional:  Positive for fever.  HENT:  Positive for sinus pain and sore throat. Negative for congestion, ear pain and hearing loss.        (+) fluid in her ears   Eyes:  Negative for blurred vision and pain.  Respiratory:  Negative for cough, sputum production, shortness of breath and wheezing.   Cardiovascular:  Negative for chest pain and palpitations.  Gastrointestinal:  Negative for blood in stool, constipation, diarrhea, nausea and vomiting.  Genitourinary:  Negative for dysuria, frequency, hematuria and urgency.  Musculoskeletal:  Negative for back pain, falls and myalgias.  Neurological:  Negative for dizziness, sensory change, loss of consciousness, weakness and headaches.  Endo/Heme/Allergies:  Negative for environmental  allergies. Does not bruise/bleed easily.  Psychiatric/Behavioral:  Negative for depression and suicidal ideas. The patient is not nervous/anxious and does not have insomnia.       Objective:    Physical Exam Constitutional:      General: She is not in acute distress.    Appearance: Normal appearance. She is not ill-appearing.  HENT:     Head: Normocephalic and atraumatic.     Right Ear: External ear normal.     Left Ear: External ear normal.     Mouth/Throat:     Palate: Lesions present.     Pharynx: Posterior oropharyngeal erythema present.     Comments: Angry-looking sores on the inside of her cheek and tongue Eyes:     Extraocular Movements: Extraocular movements intact.     Pupils: Pupils are equal, round, and reactive to light.  Cardiovascular:     Rate and Rhythm: Normal rate and regular rhythm.     Pulses: Normal pulses.     Heart sounds: Normal heart sounds. No murmur heard.   No gallop.  Pulmonary:     Effort: Pulmonary effort is normal. No respiratory distress.     Breath sounds: Normal breath sounds. No wheezing, rhonchi or rales.  Abdominal:     General: Bowel sounds are normal. There is no distension.     Palpations: Abdomen is soft. There is no mass.     Tenderness: There is no abdominal tenderness. There is no guarding or rebound.     Hernia: No hernia is present.  Musculoskeletal:     Cervical back: Normal range of motion and neck supple.  Lymphadenopathy:     Cervical: Cervical adenopathy present.  Skin:    General: Skin is warm and dry.  Neurological:  Mental Status: She is alert and oriented to person, place, and time.  Psychiatric:        Behavior: Behavior normal.    BP 110/80 (BP Location: Left Arm, Patient Position: Sitting, Cuff Size: Large)    Pulse 85    Temp 97.8 F (36.6 C) (Oral)    Resp 18    Ht  (1.702 m)    Wt 293 lb 9.6 oz (133.2 kg)    LMP 05/06/2021    SpO2 100%    BMI 45.98 kg/m  Wt Readings from Last 3 Encounters:   06/03/21 293 lb 9.6 oz (133.2 kg)  05/05/21 295 lb 9.6 oz (134.1 kg)  12/29/20 (!) 306 lb 6.4 oz (139 kg)    Diabetic Foot Exam - Simple   No data filed    Wells Results  Component Value Date   WBC 13.6 (H) 05/23/2019   HGB 11.0 (L) 05/23/2019   HCT 33.5 (L) 05/23/2019   PLT 210 05/23/2019   GLUCOSE 80 10/20/2020   CHOL 177 05/16/2018   TRIG 83.0 05/16/2018   HDL 52.00 05/16/2018   LDLCALC 108 (H) 05/16/2018   ALT 26 05/23/2019   AST 19 05/23/2019   NA 136 10/20/2020   K 4.0 10/20/2020   CL 100 10/20/2020   CREATININE 0.89 10/20/2020   BUN 13 10/20/2020   CO2 25 10/20/2020   TSH 1.60 10/20/2020   HGBA1C 4.4 (L) 05/19/2019    Wells Results  Component Value Date   TSH 1.60 10/20/2020   Wells Results  Component Value Date   WBC 13.6 (H) 05/23/2019   HGB 11.0 (L) 05/23/2019   HCT 33.5 (L) 05/23/2019   MCV 99.4 05/23/2019   PLT 210 05/23/2019   Wells Results  Component Value Date   NA 136 10/20/2020   K 4.0 10/20/2020   CO2 25 10/20/2020   GLUCOSE 80 10/20/2020   BUN 13 10/20/2020   CREATININE 0.89 10/20/2020   BILITOT 1.1 05/23/2019   ALKPHOS 43 05/23/2019   AST 19 05/23/2019   ALT 26 05/23/2019   PROT 5.8 (L) 05/23/2019   ALBUMIN 3.5 05/23/2019   CALCIUM 8.8 10/20/2020   ANIONGAP 9 05/23/2019   GFR 81.47 10/20/2020   Wells Results  Component Value Date   CHOL 177 05/16/2018   Wells Results  Component Value Date   HDL 52.00 05/16/2018   Wells Results  Component Value Date   LDLCALC 108 (H) 05/16/2018   Wells Results  Component Value Date   TRIG 83.0 05/16/2018   Wells Results  Component Value Date   CHOLHDL 3 05/16/2018   Wells Results  Component Value Date   HGBA1C 4.4 (L) 05/19/2019       Assessment & Plan:   Problem List Items Addressed This Visit       Unprioritized   Lymphadenopathy    Check labs  pred taper  Depo medrol Change abx per orders F/u ent       Relevant Medications   cefdinir (OMNICEF) 300 MG capsule   predniSONE  (DELTASONE) 10 MG tablet   Other Relevant Orders   CBC with Differential/Platelet   Comprehensive metabolic panel   Monospot   Epstein-Barr virus VCA antibody panel   Mouth sores - Primary    Magic mouthwash Change abx to omnicef pred taper  Pt called Dr Sharl Ma and he said it was ok for her to take the pred taper--- hold hydrocortisone until on 5 mg and then restart prednisone  Relevant Medications   magic mouthwash w/lidocaine SOLN   cefdinir (OMNICEF) 300 MG capsule   predniSONE (DELTASONE) 10 MG tablet   Other Relevant Orders   Ambulatory referral to ENT   CBC with Differential/Platelet   Comprehensive metabolic panel   Monospot   Epstein-Barr virus VCA antibody panel    Meds ordered this encounter  Medications   magic mouthwash w/lidocaine SOLN    Sig: Take 5 mLs by mouth 4 (four) times daily as needed for mouth pain. Swish and Spit    Dispense:  240 mL    Refill:  0    ([facility] w/lidocaine 1:1) Diphenhydramine (12.66ml/5ml) Nystatin (100,000 units/79ml), 15ml Lidocaine 2% solution,   cefdinir (OMNICEF) 300 MG capsule    Sig: Take 1 capsule (300 mg total) by mouth 2 (two) times daily.    Dispense:  20 capsule    Refill:  0   predniSONE (DELTASONE) 10 MG tablet    Sig: TAKE 3 TABLETS PO QD FOR 3 DAYS THEN TAKE 2 TABLETS PO QD FOR 3 DAYS THEN TAKE 1 TABLET PO QD FOR 3 DAYS THEN TAKE 1/2 TAB PO QD FOR 3 DAYS    Dispense:  20 tablet    Refill:  0   methylPREDNISolone acetate (DEPO-MEDROL) injection 40 mg    I,Zite Okoli,acting as a Neurosurgeon for [employer], DO.,have documented all relevant documentation on the behalf of Donato Schultz, DO,as directed by  Donato Schultz, DO while in the presence of Donato Schultz, DO.   I, Donato Schultz, DO., personally preformed the services described in this documentation.  All medical record entries made by the scribe were at my direction and in my presence.  I have reviewed the chart and  discharge instructions (if applicable) and agree that the record reflects my personal performance and is accurate and complete. 06/03/2021

## 2021-06-03 NOTE — Progress Notes (Signed)
Plan of Care Note for accepted transfer ? ? ?Patient: Diane Wells MRN: [id number]   DOA: 06/03/2021 ? ?Facility requesting transfer: [facility] ED ?Requesting Provider: Dr. Donnald Garre ?Reason for transfer: Severe neutropenia with ongoing fevers, oral ulcers ?Facility course:  ? ?41 year old female with past medical history of granulomatosis with polyangiitis as well as rheumatoid arthritis on CellCept and Plaquenil presenting to med [facility] emergency department with complaints of fevers and malaise. ? ?Of note, patient presented to a local urgent care clinic on 3/7 with complaints of cough congestion and fevers.  Patient was sent home with a course of Augmentin for suspected sinus infection. ? ?In the days that followed patient continued to experience ongoing fevers and generalized malaise as well as oral ulcerations and enlarging cervical lymph nodes.  Patient went to see her primary care provider earlier in the day on 3/10.  Original plan was for patient to be sent home on Omnicef however outpatient blood work revealed that the patient was significantly leukopenic with initial white blood cell count of 1.8 and therefore decision was made to send the patient to the emergency department. ? ?Upon evaluation at [facility] emergency department chest x-ray revealed no evidence of pneumonia.  Patient was initiated on intravenous cefepime.  Repeat blood work confirmed significant leukopenia with white blood cell count of 1.5 and absolute neutrophil count of 0.  ER provider discussed case with Dr. Parke Poisson with hematology who recommended hospitalization on broad-spectrum antibiotics and that hematology would see the patient in the morning. ? ? ?Plan of care: ?The patient is accepted for admission to Med-surg  unit, at [facility]..  ? ? ?Author: ?Marinda Elk, MD ?06/03/2021 ? ?Check [email or web address] for on-call coverage. ? ?Nursing staff, Please call [facility] Admits & Consults System-Wide number on Amion  as soon as patient's arrival, so appropriate admitting provider can evaluate the pt. ?

## 2021-06-03 NOTE — ED Notes (Signed)
Patient states IV #20 R AC was painful and bothering her.  New #22 gauge IV started to L proximal forearm.  Unable to obtain serum labs.  Venipuncture performed to R forearm and serum labs obtained.   ?

## 2021-06-03 NOTE — Telephone Encounter (Signed)
Spoke with patient. Pt agreed to go to the ED ?

## 2021-06-03 NOTE — ED Notes (Signed)
Report given to Jasa with [facility] 

## 2021-06-03 NOTE — ED Notes (Signed)
Report to Virgilio Belling, RN. ?

## 2021-06-03 NOTE — Patient Instructions (Signed)
Oral Ulcers °Oral ulcers are small sores inside the mouth or near the mouth. They may occur on or inside the lips, inside the cheeks, on the tongue, or anywhere else inside or near the mouth. They may be called canker sores or cold sores, which are two types of oral ulcers. Many oral ulcers are harmless and go away on their own. In some cases, oral ulcers may require medical care to determine the cause and proper treatment. °What are the causes? °Common causes of this condition include: °Infections caused by viruses, bacteria, or fungi. °Emotional stress. °Foods or chemicals that irritate the mouth. °Injury or physical irritation of the mouth. °Medicines. °Allergies. °Tobacco use. °Less common causes include: °Skin disease. °A type of herpes virus infection (herpes simplex or herpes zoster). °Oral cancer. °In some cases, the cause may not be known. °What increases the risk? °You are more likely to develop this condition if: °You wear dental braces, dentures, or retainers. °You have poor oral hygiene. °You have sensitive skin. °You have a condition that affects the entire body (systemic condition), such as an immune disorder. °What are the signs or symptoms? °The main symptom of this condition is having one or more oval-shaped or round ulcers that have red borders. Symptoms may vary depending on the cause. This includes: °Location of the ulcers. Ulcers may be found inside the mouth, on the gums, or on the insides of the lips or cheeks. They may also be found on the lips or on skin that is near the mouth, such as the cheeks or chin. °Pain. Ulcers can be painful and uncomfortable, or they can be painless. °Appearance of the ulcers. They may look like red blisters and be filled with fluid, or they may be white or yellow patches. °Frequency of outbreaks. Ulcers may go away permanently after one outbreak, or they may come back (recur) often or rarely. °How is this diagnosed? °This condition is diagnosed with a physical  exam. Your health care provider may ask you questions about your lifestyle and your medical history. You may have tests, including: °Blood tests. °Removal of a small number of cells from an ulcer to be examined under a microscope (biopsy). °How is this treated? °Treatment depends on the severity and cause of the condition. Oral ulcers often go away on their own in 1-2 weeks. Treatment may include medicines, such as: °Medicines to treat a viral infection (antivirals), a bacterial infection (antibiotics), or a fungal infection (antifungals). °Medicines to help control pain. This may include: °Over-the-counter pain medicines. °Gel, cream, or spray to numb the area (topical anesthetic) if you have severe pain. °Other medicines to coat or numb your mouth. °Follow these instructions at home: °Medicines °Take or use over-the-counter and prescription medicines only as told by your health care provider. °If you were prescribed an antibiotic medicine, take it as told by your health care provider. Do not stop taking the antibiotic even if you start to feel better. °Do not use products that contain benzocaine (including numbing gels) to treat teething or mouth pain in children who are younger than 2 years. These products may cause a rare but serious blood condition. °Eating and drinking °Eat a balanced diet. Do not eat: °Spicy foods. °Citrus, such as oranges. °Other foods and drinks that you think may cause or irritate your ulcers. °Drink enough fluid to keep your urine pale yellow. °Avoid drinking alcohol. °Lifestyle ° °Practice good oral hygiene: °Gently brush your teeth with a soft toothbrush two times a day. °Floss   your teeth every day. °Get regular dental cleanings and checkups. °Do not use any products that contain nicotine or tobacco, such as cigarettes and e-cigarettes. If you need help quitting, ask your health care provider. °Managing pain °If directed, put ice on your face in the affected area to help reduce  pain. °Put ice in a plastic bag. °Place a towel between your skin and the bag. °Leave the ice on for 20 minutes, 2-3 times a day. °Avoid physical or chemical irritants that may have caused the ulcers or made them worse, such as mouthwashes that contain alcohol (ethanol). If you wear dental braces, dentures, or retainers, work with your health care provider to make sure these devices are fitted correctly. °If you were prescribed a prescription mouthwash to help reduce pain in your mouth, use it as told by your health care provider. °General instructions °Rinse with a salt-water mixture 3-4 times a day or as told by your health care provider. To make a salt-water mixture, completely dissolve ½-1 tsp (3-6 g) of salt in 1 cup (237 mL) of warm water. °Keep all follow-up visits as told by your health care provider. This is important. °Contact a health care provider if: °You have: °Pain that gets worse or does not get better with medicine. °Four or more ulcers at one time. °A fever. °New ulcers that look or feel different from other ulcers you have. °Inflammation in one eye or both eyes. °Ulcers that do not go away after 10 days. °You develop new symptoms in your mouth, such as: °Bleeding or crusting around your lips or gums. °Tooth pain. °Difficulty swallowing. °You develop symptoms on your skin or genitals, such as: °A rash or blisters. °Burning or itching sensations. °Your ulcers begin or get worse after you start a new medicine. °Get help right away if you have: °Difficulty breathing. °Swelling in your face or neck. °Excessive bleeding from your mouth. °Severe pain. °Summary °Oral ulcers may occur anywhere inside or near the mouth. °They can be caused by many things, such as infections, stress, injury or irritation, or tobacco use. °Oral ulcers can be painful or painless. °Treatment may include medicines to relieve pain or to treat an infection (if appropriate). °Most oral ulcers go away in 1-2 weeks. °This information  is not intended to replace advice given to you by your health care provider. Make sure you discuss any questions you have with your health care provider. °Document Revised: 09/16/2020 Document Reviewed: 07/26/2017 °Elsevier Patient Education © 2022 [employer]. ° °

## 2021-06-03 NOTE — Assessment & Plan Note (Addendum)
Magic mouthwash ?Change abx to omnicef ?pred taper  ?Pt called Dr Sharl Ma and he said it was ok for her to take the pred taper--- hold hydrocortisone until on 5 mg and then restart prednisone ?

## 2021-06-03 NOTE — ED Notes (Signed)
[facility] to bedside to assume care of patient. ?

## 2021-06-03 NOTE — Assessment & Plan Note (Signed)
Check labs  ?pred taper  ?Depo medrol ?Change abx per orders ?F/u ent  ?

## 2021-06-03 NOTE — ED Triage Notes (Signed)
Pt c/o flu like sx x 5 days-seen at [place] flu/strep-started on abx for sinus infection-reports neg covid home tests x 3-seen by PCP today-abx changed-drew blood work-was advised to come to ED due to [facility] gait ?

## 2021-06-03 NOTE — Telephone Encounter (Signed)
CRITICAL VALUE STICKER ? ?CRITICAL VALUE: WBC 1.8 ? ?RECEIVER (on-site recipient of call): ? ?DATE & TIME NOTIFIED: 06/03/21 at 4:04 PM ? ?MESSENGER (representative from lab): Clydie BraunKaren ? ?MD NOTIFIED: yes ? ?TIME OF NOTIFICATION: 06/03/21 at 4:04 PM ? ?RESPONSE: pending ? ?

## 2021-06-04 ENCOUNTER — Inpatient Hospital Stay (HOSPITAL_COMMUNITY): Payer: BC Managed Care – PPO

## 2021-06-04 DIAGNOSIS — R6510 Systemic inflammatory response syndrome (SIRS) of non-infectious origin without acute organ dysfunction: Secondary | ICD-10-CM | POA: Diagnosis not present

## 2021-06-04 DIAGNOSIS — E2749 Other adrenocortical insufficiency: Secondary | ICD-10-CM | POA: Diagnosis present

## 2021-06-04 DIAGNOSIS — M0520 Rheumatoid vasculitis with rheumatoid arthritis of unspecified site: Secondary | ICD-10-CM

## 2021-06-04 DIAGNOSIS — I10 Essential (primary) hypertension: Secondary | ICD-10-CM | POA: Diagnosis not present

## 2021-06-04 DIAGNOSIS — I129 Hypertensive chronic kidney disease with stage 1 through stage 4 chronic kidney disease, or unspecified chronic kidney disease: Secondary | ICD-10-CM | POA: Diagnosis present

## 2021-06-04 DIAGNOSIS — M3130 Wegener's granulomatosis without renal involvement: Secondary | ICD-10-CM | POA: Diagnosis present

## 2021-06-04 DIAGNOSIS — Z823 Family history of stroke: Secondary | ICD-10-CM | POA: Diagnosis not present

## 2021-06-04 DIAGNOSIS — M0500 Felty's syndrome, unspecified site: Secondary | ICD-10-CM | POA: Diagnosis present

## 2021-06-04 DIAGNOSIS — R5081 Fever presenting with conditions classified elsewhere: Secondary | ICD-10-CM | POA: Diagnosis present

## 2021-06-04 DIAGNOSIS — G8929 Other chronic pain: Secondary | ICD-10-CM | POA: Diagnosis present

## 2021-06-04 DIAGNOSIS — Z825 Family history of asthma and other chronic lower respiratory diseases: Secondary | ICD-10-CM | POA: Diagnosis not present

## 2021-06-04 DIAGNOSIS — Z79624 Long term (current) use of inhibitors of nucleotide synthesis: Secondary | ICD-10-CM | POA: Diagnosis not present

## 2021-06-04 DIAGNOSIS — J4540 Moderate persistent asthma, uncomplicated: Secondary | ICD-10-CM

## 2021-06-04 DIAGNOSIS — Z20822 Contact with and (suspected) exposure to covid-19: Secondary | ICD-10-CM | POA: Diagnosis present

## 2021-06-04 DIAGNOSIS — K121 Other forms of stomatitis: Secondary | ICD-10-CM | POA: Diagnosis present

## 2021-06-04 DIAGNOSIS — Z806 Family history of leukemia: Secondary | ICD-10-CM | POA: Diagnosis not present

## 2021-06-04 DIAGNOSIS — D709 Neutropenia, unspecified: Secondary | ICD-10-CM

## 2021-06-04 DIAGNOSIS — K1239 Other oral mucositis (ulcerative): Secondary | ICD-10-CM | POA: Diagnosis present

## 2021-06-04 DIAGNOSIS — Z79899 Other long term (current) drug therapy: Secondary | ICD-10-CM | POA: Diagnosis not present

## 2021-06-04 DIAGNOSIS — Z800 Family history of malignant neoplasm of digestive organs: Secondary | ICD-10-CM | POA: Diagnosis not present

## 2021-06-04 DIAGNOSIS — E041 Nontoxic single thyroid nodule: Secondary | ICD-10-CM | POA: Diagnosis present

## 2021-06-04 DIAGNOSIS — N189 Chronic kidney disease, unspecified: Secondary | ICD-10-CM | POA: Diagnosis present

## 2021-06-04 DIAGNOSIS — Z833 Family history of diabetes mellitus: Secondary | ICD-10-CM | POA: Diagnosis not present

## 2021-06-04 DIAGNOSIS — Z8249 Family history of ischemic heart disease and other diseases of the circulatory system: Secondary | ICD-10-CM | POA: Diagnosis not present

## 2021-06-04 DIAGNOSIS — N83201 Unspecified ovarian cyst, right side: Secondary | ICD-10-CM | POA: Diagnosis present

## 2021-06-04 DIAGNOSIS — R509 Fever, unspecified: Secondary | ICD-10-CM | POA: Diagnosis present

## 2021-06-04 DIAGNOSIS — Z6841 Body Mass Index (BMI) 40.0 and over, adult: Secondary | ICD-10-CM | POA: Diagnosis not present

## 2021-06-04 DIAGNOSIS — D708 Other neutropenia: Secondary | ICD-10-CM | POA: Diagnosis present

## 2021-06-04 LAB — VITAMIN B12: Vitamin B-12: 371 pg/mL (ref 180–914)

## 2021-06-04 LAB — COMPREHENSIVE METABOLIC PANEL
ALT: 13 U/L (ref 0–44)
AST: 13 U/L — ABNORMAL LOW (ref 15–41)
Albumin: 3.1 g/dL — ABNORMAL LOW (ref 3.5–5.0)
Alkaline Phosphatase: 36 U/L — ABNORMAL LOW (ref 38–126)
Anion gap: 8 (ref 5–15)
BUN: 12 mg/dL (ref 6–20)
CO2: 22 mmol/L (ref 22–32)
Calcium: 7.9 mg/dL — ABNORMAL LOW (ref 8.9–10.3)
Chloride: 104 mmol/L (ref 98–111)
Creatinine, Ser: 1.05 mg/dL — ABNORMAL HIGH (ref 0.44–1.00)
GFR, Estimated: >60 mL/min (ref >60)
Glucose, Bld: 100 mg/dL — ABNORMAL HIGH (ref 70–99)
Potassium: 3.8 mmol/L (ref 3.5–5.1)
Sodium: 134 mmol/L — ABNORMAL LOW (ref 135–145)
Total Bilirubin: 0.4 mg/dL (ref 0.3–1.2)
Total Protein: 6.2 g/dL — ABNORMAL LOW (ref 6.5–8.1)

## 2021-06-04 LAB — FOLATE: Folate: 43.7 ng/mL

## 2021-06-04 LAB — CBC WITH DIFFERENTIAL/PLATELET

## 2021-06-04 LAB — C DIFFICILE QUICK SCREEN W PCR REFLEX
C Diff antigen: NEGATIVE
C Diff interpretation: NOT DETECTED
C Diff toxin: NEGATIVE

## 2021-06-04 LAB — LACTIC ACID, PLASMA: Lactic Acid, Venous: 0.7 mmol/L (ref 0.5–1.9)

## 2021-06-04 MED ORDER — SODIUM CHLORIDE 0.9 % IV SOLN
2.0000 g | Freq: Three times a day (TID) | INTRAVENOUS | Status: DC
  Administered 2021-06-04 – 2021-06-08 (×14): 2 g via INTRAVENOUS
  Filled 2021-06-04 (×15): qty 2

## 2021-06-04 MED ORDER — MYCOPHENOLATE MOFETIL 250 MG PO CAPS: 1500.0000 mg | ORAL_CAPSULE | Freq: Two times a day (BID) | ORAL | Status: DC

## 2021-06-04 MED ORDER — IOHEXOL 300 MG/ML  SOLN
100.0000 mL | Freq: Once | INTRAMUSCULAR | Status: AC | PRN
  Administered 2021-06-04: 100 mL via INTRAVENOUS

## 2021-06-04 MED ORDER — SODIUM CHLORIDE 0.9 % IV SOLN: INTRAVENOUS | Status: AC

## 2021-06-04 MED ORDER — DIPHENHYDRAMINE HCL 25 MG PO CAPS
25.0000 mg | ORAL_CAPSULE | Freq: Four times a day (QID) | ORAL | Status: DC | PRN
  Administered 2021-06-04: 25 mg via ORAL
  Filled 2021-06-04: qty 1

## 2021-06-04 MED ORDER — IOHEXOL 9 MG/ML PO SOLN
ORAL | Status: AC
  Filled 2021-06-04: qty 2000

## 2021-06-04 MED ORDER — ONDANSETRON HCL 4 MG/2ML IJ SOLN
4.0000 mg | Freq: Four times a day (QID) | INTRAMUSCULAR | Status: DC | PRN
  Administered 2021-06-04 – 2021-06-10 (×6): 4 mg via INTRAVENOUS
  Filled 2021-06-04 (×6): qty 2

## 2021-06-04 MED ORDER — HYDROCORTISONE 5 MG PO TABS
5.0000 mg | ORAL_TABLET | Freq: Every day | ORAL | Status: DC
  Administered 2021-06-05 – 2021-06-09 (×5): 5 mg via ORAL
  Filled 2021-06-04 (×7): qty 1

## 2021-06-04 MED ORDER — TBO-FILGRASTIM 480 MCG/0.8ML ~~LOC~~ SOSY
480.0000 ug | PREFILLED_SYRINGE | Freq: Every day | SUBCUTANEOUS | Status: DC
  Administered 2021-06-04 – 2021-06-10 (×7): 480 ug via SUBCUTANEOUS
  Filled 2021-06-04 (×8): qty 0.8

## 2021-06-04 MED ORDER — HYDROCORTISONE 10 MG PO TABS
10.0000 mg | ORAL_TABLET | Freq: Every day | ORAL | Status: DC
  Administered 2021-06-04 – 2021-06-10 (×7): 10 mg via ORAL
  Filled 2021-06-04 (×7): qty 1

## 2021-06-04 MED ORDER — SODIUM CHLORIDE 0.9 % IV SOLN
2.0000 g | Freq: Three times a day (TID) | INTRAVENOUS | Status: DC
  Filled 2021-06-04: qty 2

## 2021-06-04 MED ORDER — MOMETASONE FURO-FORMOTEROL FUM 200-5 MCG/ACT IN AERO
2.0000 | INHALATION_SPRAY | Freq: Two times a day (BID) | RESPIRATORY_TRACT | Status: DC
  Administered 2021-06-04 – 2021-06-10 (×12): 2 via RESPIRATORY_TRACT
  Filled 2021-06-04: qty 8.8

## 2021-06-04 MED ORDER — SIMETHICONE 80 MG PO CHEW
80.0000 mg | CHEWABLE_TABLET | Freq: Once | ORAL | Status: AC
  Administered 2021-06-04: 80 mg via ORAL
  Filled 2021-06-04: qty 1

## 2021-06-04 MED ORDER — VANCOMYCIN HCL 1750 MG/350ML IV SOLN
1750.0000 mg | INTRAVENOUS | Status: DC
Start: 2021-06-05 — End: 2021-06-04

## 2021-06-04 MED ORDER — VANCOMYCIN HCL 2000 MG/400ML IV SOLN
2000.0000 mg | Freq: Once | INTRAVENOUS | Status: DC
  Administered 2021-06-04: 2000 mg via INTRAVENOUS
  Filled 2021-06-04: qty 400

## 2021-06-04 MED ORDER — IOHEXOL 9 MG/ML PO SOLN: 500.0000 mL | ORAL | Status: AC

## 2021-06-04 MED ORDER — LINEZOLID 600 MG/300ML IV SOLN
600.0000 mg | Freq: Two times a day (BID) | INTRAVENOUS | Status: DC
  Administered 2021-06-05 – 2021-06-06 (×3): 600 mg via INTRAVENOUS
  Filled 2021-06-04 (×3): qty 300

## 2021-06-04 MED ORDER — ALBUTEROL SULFATE HFA 108 (90 BASE) MCG/ACT IN AERS: 1.0000 | INHALATION_SPRAY | Freq: Once | RESPIRATORY_TRACT | Status: DC | PRN

## 2021-06-04 MED ORDER — EPINEPHRINE 0.3 MG/0.3ML IJ SOAJ
0.3000 mg | Freq: Once | INTRAMUSCULAR | Status: DC | PRN
  Filled 2021-06-04: qty 0.6

## 2021-06-04 MED ORDER — MAGIC MOUTHWASH W/LIDOCAINE
5.0000 mL | Freq: Four times a day (QID) | ORAL | Status: DC
  Administered 2021-06-04 – 2021-06-06 (×10): 5 mL via ORAL
  Filled 2021-06-04 (×10): qty 5

## 2021-06-04 MED ORDER — HYDROXYCHLOROQUINE SULFATE 200 MG PO TABS: 200.0000 mg | ORAL_TABLET | Freq: Two times a day (BID) | ORAL | Status: DC

## 2021-06-04 MED ORDER — SODIUM CHLORIDE 0.9 % IV SOLN: INTRAVENOUS | Status: DC

## 2021-06-04 MED ORDER — MAGIC MOUTHWASH W/LIDOCAINE
5.0000 mL | Freq: Four times a day (QID) | ORAL | Status: DC | PRN
  Filled 2021-06-04 (×2): qty 5

## 2021-06-04 MED ORDER — ALBUTEROL SULFATE HFA 108 (90 BASE) MCG/ACT IN AERS: 1.0000 | INHALATION_SPRAY | RESPIRATORY_TRACT | Status: DC | PRN

## 2021-06-04 MED ORDER — FLUCONAZOLE IN SODIUM CHLORIDE 200-0.9 MG/100ML-% IV SOLN
200.0000 mg | INTRAVENOUS | Status: DC
  Administered 2021-06-04 – 2021-06-09 (×6): 200 mg via INTRAVENOUS
  Filled 2021-06-04 (×7): qty 100

## 2021-06-04 NOTE — Assessment & Plan Note (Signed)
Continue with Dulera and PRN albuterol.  ?

## 2021-06-04 NOTE — H&P (Signed)
History and Physical   Diane Wells QIO:[id number] DOB: January 10, 1981 DOA: 06/03/2021  PCP: Pearline Cables, MD   Patient coming from: Home  Chief Complaint: Fever, fatigue, abnormal labs  HPI: Diane Wells is a 41 y.o. female with medical history significant of asthma, granulomatosis with polyangiitis, reported arthritis, iatrogenic Cushing's, hypertension, CKD presenting with ongoing fevers fatigue and new relief discovered abnormal labs.  Patient states that 5 days ago she woke with fevers and chills.  She also noted some sinus pressure and congestion.  She later developed some cervical lymphadenopathy.  Her fevers at the time were in the 10 2-1 03 range.  She had been having recurrent sinus infections and she went to be evaluated at urgent care.  She was started on Augmentin in addition to other supportive care.  She initially felt better but then started to develop oral lesions which were painful leading to decreased p.o. intake.  Went to see her PCP today and plan was initially to switch patient to [facility] however labs were checked and she was sent to the ED for further evaluation given her leukopenia and ANC of 0.0.  She states that due to her illness she has held her CellCept and Paxil and increased her home Cortef dose from 5 mg to 10 mg daily.  She denies chest pain, shortness of breath, abdominal pain, constipation, diarrhea, nausea, vomiting.  ED Course: Vital signs in the ED significant for tachycardia in the 100s.  Lab work-up included CMP with creatinine elevated to 1.29 from a baseline of 1, calcium 8.6.  CBC confirming leukopenia of 1.5 and ANC of 0.0.  ESR elevated to 25 and CRP elevated to 14.2.  Monospot test negative.  Lactic acid normal with repeat pending.  Respiratory panel for flu and COVID-negative.  Urinalysis with only trace hemoglobin and rare bacteria.  Chest x-ray showed no acute abnormality.  EBV labs also ordered outpatient per chart review.  And are  pending.  She received cefepime, oxycodone, losartan, albuterol in the ED.  Hematology was consulted and recommended admission with coverage with broad-spectrum antibiotics.  Hematology will consult in the morning.  Review of Systems: As per HPI otherwise all other systems reviewed and are negative.  Past Medical History:  Diagnosis Date   Allergic rhinitis    Asthma    Chronic kidney disease    Granulomatosis with polyangiitis (HCC)    Hypertension    Recurrent upper respiratory infection (URI)     Past Surgical History:  Procedure Laterality Date   ADENOIDECTOMY     ANTERIOR CERVICAL DECOMP/DISCECTOMY FUSION      Social History  reports that she has never smoked. She has never used smokeless tobacco. She reports current alcohol use. She reports that she does not use drugs.  Allergies  Allergen Reactions   Albuterol Other (See Comments) and Shortness Of Breath   Other Anaphylaxis    Essential Oil, scent unknown, caused tongue swelling   Truxima [Rituximab] Anaphylaxis   Sulfa Antibiotics     Family History  Problem Relation Age of Onset   Diabetes Mother        Type 1 Diabetes, juvenile onset   Hypertension Mother    Pulmonary Hypertension Mother    Hyperthyroidism Mother    Glaucoma Mother    Neuropathy Mother    Sjogren's syndrome Mother    Carpal tunnel syndrome Mother    Hypertension Father    Gallbladder disease Father    Hypertension Maternal Grandmother  Glaucoma Maternal Grandmother    Coronary artery disease Maternal Grandmother    Heart attack Maternal Grandmother    Angina Maternal Grandmother    Macular degeneration Maternal Grandmother    COPD Maternal Grandmother    Congestive Heart Failure Maternal Grandmother    Glaucoma Maternal Grandfather    Leukemia Paternal Grandmother    Hypertension Paternal Grandmother    Colon cancer Paternal Grandfather    Diabetes Maternal Uncle    Heart attack Maternal Uncle    Stroke Maternal Uncle     Allergic rhinitis Neg Hx    Angioedema Neg Hx    Asthma Neg Hx    Eczema Neg Hx   Reviewed on admission  Prior to Admission medications   Medication Sig Start Date End Date Taking? Authorizing Provider  budesonide-formoterol (SYMBICORT) 160-4.5 MCG/ACT inhaler Inhale 2 puffs into the lungs in the morning and at bedtime. 05/05/21   Charlott Holler, MD  butalbital-acetaminophen-caffeine (FIORICET) [id number] MG tablet Take 1 tablet by mouth every 6 (six) hours as needed for headache. 10/20/20   Copland, Gwenlyn Found, MD  fexofenadine (ALLEGRA) 180 MG tablet Take 180 mg by mouth daily.    [provider]  fluticasone (FLONASE) 50 MCG/ACT nasal spray Place 2 sprays into both nostrils in the morning and at bedtime. 01/01/20 12/31/20  Charlott Holler, MD  furosemide (LASIX) 20 MG tablet TAKE 1 (ONE) TABLET DAILY AS NEEDED Patient taking differently: Patient taking 40mg  04/13/21   Copland, Gwenlyn Found, MD  gabapentin (NEURONTIN) 300 MG capsule TAKE 1 CAPSULE BY MOUTH THREE TIMES A DAY 10/20/20   Copland, Gwenlyn Found, MD  HYDROcodone bit-homatropine (HYCODAN) 5-1.5 MG/5ML syrup Take by mouth. 05/31/21 06/05/21  [provider]  hydrocortisone (CORTEF) 10 MG tablet 10 mg Am and 5 mg PM. 10/02/20   [provider]  hydroxychloroquine (PLAQUENIL) 200 MG tablet Take 200 mg by mouth 2 (two) times daily. 11/29/20   [provider]  ketorolac (ACULAR) 0.5 % ophthalmic solution Place 1 drop into the left eye 4 (four) times daily. 11/11/20   [provider]  losartan (COZAAR) 25 MG tablet TAKE 3 TABLETS BY MOUTH DAILY 05/03/21   Copland, Gwenlyn Found, MD  magic mouthwash w/lidocaine SOLN Take 5 mLs by mouth 4 (four) times daily as needed for mouth pain. Swish and Spit 06/03/21   Zola Button, Grayling Congress, DO  methocarbamol (ROBAXIN) 750 MG tablet TAKE 1 TABLET (750 MG TOTAL) BY MOUTH EVERY 8 (EIGHT) HOURS AS NEEDED FOR MUSCLE SPASMS. 02/10/21   Copland, Gwenlyn Found, MD  Multiple Vitamin (MULTIVITAMIN)  tablet Take 1 tablet by mouth daily.    [provider]  mycophenolate (CELLCEPT) 500 MG tablet Take 1,500 mg by mouth 2 (two) times daily. 12/12/19   [provider]  ondansetron (ZOFRAN) 8 MG tablet Take 1 tablet (8 mg total) by mouth every 8 (eight) hours as needed for nausea or vomiting. 10/20/20   Copland, Gwenlyn Found, MD  predniSONE (DELTASONE) 10 MG tablet TAKE 3 TABLETS PO QD FOR 3 DAYS THEN TAKE 2 TABLETS PO QD FOR 3 DAYS THEN TAKE 1 TABLET PO QD FOR 3 DAYS THEN TAKE 1/2 TAB PO QD FOR 3 DAYS 06/03/21   Donato Schultz, DO  PROAIR HFA [phone] Base) MCG/ACT inhaler Inhale 1-2 puffs into the lungs daily as needed for shortness of breath. 06/11/19   Charlott Holler, MD  VITAMIN D PO Take by mouth.    [provider]  Physical Exam: Vitals:   06/03/21 2030 06/03/21 2037 06/03/21 2100 06/03/21 2330  BP: (!) 150/85  133/88 134/89  Pulse: (!) 111  (!) 104 (!) 114  Resp:   16 16  Temp:  99.2 F (37.3 C)  (!) 100.9 F (38.3 C)  TempSrc:  Oral  Oral  SpO2: 100%  100% 99%  Weight:      Height:        Physical Exam Constitutional:      General: She is not in acute distress.    Appearance: Normal appearance. She is obese.  HENT:     Head: Normocephalic and atraumatic.     Mouth/Throat:     Mouth: Mucous membranes are moist.     Comments: lesions Eyes:     Extraocular Movements: Extraocular movements intact.     Pupils: Pupils are equal, round, and reactive to light.  Cardiovascular:     Rate and Rhythm: Regular rhythm. Tachycardia present.     Pulses: Normal pulses.     Heart sounds: Normal heart sounds.  Pulmonary:     Effort: Pulmonary effort is normal. No respiratory distress.     Breath sounds: Normal breath sounds.  Abdominal:     General: Bowel sounds are normal. There is no distension.     Palpations: Abdomen is soft.     Tenderness: There is no abdominal tenderness.  Musculoskeletal:        General: No swelling or deformity.  Skin:     General: Skin is warm and dry.  Neurological:     General: No focal deficit present.     Mental Status: Mental status is at baseline.   Labs on Admission: I have personally reviewed following labs and imaging studies  CBC: Recent Labs  Lab 06/03/21 1104 06/03/21 1750  WBC 1.8 Repeated and verified X2.* 1.5*  NEUTROABS 0.0* 0.0*  HGB 12.3 13.3  HCT 37.5 41.0  MCV 83.6 84.9  PLT 239.0 254    Basic Metabolic Panel: Recent Labs  Lab 06/03/21 1104 06/03/21 1750  NA 137 135  K 3.7 4.2  CL 100 101  CO2 27 25  GLUCOSE 94 100*  BUN 15 15  CREATININE 1.20 1.29*  CALCIUM 8.6 8.6*    GFR: Estimated Creatinine Clearance: 82.8 mL/min (A) (by C-G formula based on SCr of 1.29 mg/dL (H)).  Liver Function Tests: Recent Labs  Lab 06/03/21 1104 06/03/21 1750  AST 11 14*  ALT 9 12  ALKPHOS 42 45  BILITOT 0.3 0.5  PROT 6.3 7.3  ALBUMIN 3.7 3.7    Urine analysis:    Component Value Date/Time   COLORURINE YELLOW 06/03/2021 2125   APPEARANCEUR HAZY (A) 06/03/2021 2125   LABSPEC 1.025 06/03/2021 2125   PHURINE 5.5 06/03/2021 2125   GLUCOSEU NEGATIVE 06/03/2021 2125   HGBUR TRACE (A) 06/03/2021 2125   BILIRUBINUR NEGATIVE 06/03/2021 2125   KETONESUR NEGATIVE 06/03/2021 2125   PROTEINUR NEGATIVE 06/03/2021 2125   NITRITE NEGATIVE 06/03/2021 2125   LEUKOCYTESUR NEGATIVE 06/03/2021 2125    Radiological Exams on Admission: DG Chest 2 View  Result Date: 06/03/2021 CLINICAL DATA:  Fever EXAM: CHEST - 2 VIEW COMPARISON:  10/24/2019 FINDINGS: Heart and mediastinal contours are within normal limits. No focal opacities or effusions. No acute bony abnormality. IMPRESSION: No active cardiopulmonary disease. Electronically Signed   By: Charlett Nose M.D.   On: 06/03/2021 19:12    EKG: not performed in the ED Assessment/Plan Principal Problem:   SIRS (systemic inflammatory response  syndrome) (HCC) Active Problems:   Moderate persistent asthma without complication   Granulomatosis  with polyangiitis with pulmonary involvement (HCC)   Neutropenia (HCC)   Essential (primary) hypertension   Rheumatoid arteritis (HCC)   Secondary adrenal insufficiency (HCC)   SIRS Neutropenia > Patient presenting with recent fevers, neutropenia, tachycardia consistent with SIRS.  No source of infection. > Leukopenia to 1.5 with ANC of 0.0. > No evidence of pneumonia on checks x-ray, no evidence of infection on urinalysis. > Flu and COVID negative in the ED. > Has been experiencing fevers initially treated for sinus infection as per HPI with plan to switch antibiotics given should her development of oral lesions but sent to the ED when lab work was done. > Is on immunosuppressant medication given her history of rheumatoid arthritis and granulomatosis with polyangiitis (which in and of itself puts her at risk of recurrent infection). > Hematology consulted in the ED, recommended admission, broad-spectrum antibiotics, will see the patient in the morning. - Appreciate hematology recommendations - Continue with broad-spectrum antibiotics, cefepime - Trend fever curve and white count/ANC - Follow-up pathology smear review - Hold CellCept and Plaquenil  Rheumatoid arthritis Granulomatosis with polyangiitis - Hold home CellCept and Plaquenil  Secondary adrenal insufficiency - Continue daily Cortef, appropriately increased from 5 to 10mg  daily by patient in the setting of illness - Consider stress dose steroids (she states she believes she got an injection of steroids at her PCP office)  Hypertension - Continue home losartan  Asthma - Replace home Symbicort with formulary Dulera - Continue as needed albuterol  DVT prophylaxis: Lovenox Code Status:   Full Family Communication:  None on admission, her family is aware of her admission and she has been updating them  Disposition Plan:   Patient is from:  Home  Anticipated DC to:  Home  Anticipated DC date:  1 to 5 days  Anticipated DC  barriers: None  Consults called:  Hematology, consulted by the ED, will see the patient in the morning. Admission status:  Observation, med surg  Severity of Illness: The appropriate patient status for this patient is OBSERVATION. Observation status is judged to be reasonable and necessary in order to provide the required intensity of service to ensure the patient's safety. The patient's presenting symptoms, physical exam findings, and initial radiographic and laboratory data in the context of their medical condition is felt to place them at decreased risk for further clinical deterioration. Furthermore, it is anticipated that the patient will be medically stable for discharge from the hospital within 2 midnights of admission.    Synetta Fail MD [facility]  How to contact the [facility] Attending or Consulting provider 7A - 7P or covering provider during after hours 7P -7A, for this patient?   Check the care team in [facility] and look for a) attending/consulting [facility] provider listed and b) the [facility] team listed Log into [email or web address] and use [place]'s universal password to access. If you do not have the password, please contact the hospital operator. Locate the [facility] provider you are looking for under [facility] and page to a number that you can be directly reached. If you still have difficulty reaching the provider, please page the [facility] (Director on Call) for the Hospitalists listed on amion for assistance.  06/04/2021, 12:09 AM

## 2021-06-04 NOTE — Consult Note (Signed)
[facility]       Reason for Consult: fever    Referring Physician: Dr. 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  Differential is medication, RA-related, infection-related.  She has been on CellCept but this is not a new medication for her.  Recently received Augmentin but was already sick.  Viral etiologies could be EBV (already sent) and CMV with her lymphadenopathy.   Also with oral ulcers on cheeks and tongue that appear plaque-like.  She is also starting to develop erythematous areas at her tooth to gum border and bleeding.    Antibiotics: Day 2 cefepime  HPI: Diane Wells is a 41 y.o. female with a history of rheumatoid arthritis, granulomatosis with polyangiitis on CellCept and Plaquenil came in with fever and cervical lymphadenopathy.  She was tested for viral etiologies which were negative and given Augmentin for concern for sinusitis.  She was then seen by her PCP and labs revealed new neutropenia with an ANC of 0 and sent in for admission.  She has oral lesions and tender cervical lymphadenopathy that is new.  Diarrhea overnight.  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     Review of Systems:  Gastrointestinal: negative for  nausea Hematologic/lymphatic: negative for lymphadenopathy except for cervical All other systems reviewed and are negative    Past Medical History:  Diagnosis Date   Allergic rhinitis    Asthma    Chronic kidney disease    Granulomatosis with polyangiitis (HCC)    Hypertension    Recurrent upper respiratory infection (URI)     Social History   Tobacco Use   Smoking status: Never   Smokeless tobacco: Never  Vaping Use   Vaping Use: Never used  Substance Use Topics   Alcohol use: Yes    Comment: Rare   Drug use: No    Family History  Problem Relation Age of Onset   Diabetes Mother        Type 1 Diabetes, juvenile onset   Hypertension Mother    Pulmonary Hypertension Mother    Hyperthyroidism Mother    Glaucoma Mother    Neuropathy Mother    Sjogren's syndrome Mother    Carpal tunnel syndrome Mother    Hypertension Father    Gallbladder disease Father    Hypertension Maternal Grandmother    Glaucoma Maternal Grandmother    Coronary artery disease Maternal Grandmother    Heart attack Maternal Grandmother    Angina Maternal Grandmother    Macular degeneration Maternal Grandmother    COPD Maternal Grandmother    Congestive Heart Failure Maternal Grandmother    Glaucoma Maternal Grandfather    Leukemia Paternal Grandmother    Hypertension Paternal Grandmother    Colon cancer Paternal Grandfather    Diabetes Maternal Uncle    Heart  attack Maternal Uncle    Stroke Maternal Uncle    Allergic rhinitis Neg Hx    Angioedema Neg Hx    Asthma Neg Hx    Eczema Neg Hx     Allergies  Allergen Reactions   Albuterol Shortness Of Breath and Other (See Comments)    Tolerates HFA inhaler, nebulizer treatment caused O2 drop   Other Anaphylaxis    Essential Oil, scent unknown, caused tongue swelling   Truxima [Rituximab] Anaphylaxis   Sulfa Antibiotics Hives   Vancomycin Itching    Scalp itching - felt like it was on fire    Physical Exam: Constitutional: in no  apparent distress  Vitals:   06/04/21 0333 06/04/21 0856  BP: 137/84 114/85  Pulse: (!) 115 98  Resp: 18 16  Temp: 100 F (37.8 C) 98.8 F (37.1 C)  SpO2: 98% 99%   EYES: anicteric ENMT: = multiple oral white plaques on both cheeks, tongue tip; erythematous/petechial area on roof of mouth at tooth border Respiratory: normal respiratory effort Musculoskeletal: no edema GI: obese, soft Skin: no rash Hematologic: + right anterior cervical lad about 3-4 mm, tender  Lab Results  Component Value Date   WBC 1.9 (L) 06/04/2021   HGB 11.6 (L) 06/04/2021   HCT 36.0 06/04/2021   MCV 84.9 06/04/2021   PLT 227 06/04/2021    Lab Results  Component Value Date   CREATININE 1.05 (H) 06/04/2021   BUN 12 06/04/2021   NA 134 (L) 06/04/2021   K 3.8 06/04/2021   CL 104 06/04/2021   CO2 22 06/04/2021    Lab Results  Component Value Date   ALT 13 06/04/2021   AST 13 (L) 06/04/2021   ALKPHOS 36 (L) 06/04/2021     Microbiology: Recent Results (from the past 240 hour(s))  Resp Panel by RT-PCR (Flu A&B, Covid) Nasopharyngeal Swab     Status: None   Collection Time: 06/03/21  7:23 PM   Specimen: Nasopharyngeal Swab; Nasopharyngeal(NP) swabs in vial transport medium  Result Value Ref Range Status   SARS Coronavirus 2 by RT PCR NEGATIVE NEGATIVE Final    Comment: (NOTE) SARS-CoV-2 target nucleic acids are NOT DETECTED.  The SARS-CoV-2 RNA is generally detectable in upper respiratory specimens during the acute phase of infection. The lowest concentration of SARS-CoV-2 viral copies this assay can detect is 138 copies/mL. A negative result does not preclude SARS-Cov-2 infection and should not be used as the sole basis for treatment or other patient management decisions. A negative result may occur with  improper specimen collection/handling, submission of specimen other than nasopharyngeal swab, presence of viral mutation(s) within the areas targeted by this assay, and inadequate number of  viral copies(<138 copies/mL). A negative result must be combined with clinical observations, patient history, and epidemiological information. The expected result is Negative.  Fact Sheet for Patients:  BloggerCourse.comhttps://www.fda.gov/media/152166/download  Fact Sheet for Healthcare Providers:  SeriousBroker.ithttps://www.fda.gov/media/152162/download  This test is no t yet approved or cleared by the [place] FDA and  has been authorized for detection and/or diagnosis of SARS-CoV-2 by FDA under an Emergency Use Authorization (EUA). This EUA will remain  in effect (meaning this test can be used) for the duration of the COVID-19 declaration under Section 564(b)(1) of the Act, 21 U.S.C.section 360bbb-3(b)(1), unless the authorization is terminated  or revoked sooner.       Influenza A by PCR NEGATIVE NEGATIVE Final   Influenza B by PCR NEGATIVE NEGATIVE Final    Comment: (NOTE) The Xpert Xpress  SARS-CoV-2/FLU/RSV plus assay is intended as an aid in the diagnosis of influenza from Nasopharyngeal swab specimens and should not be used as a sole basis for treatment. Nasal washings and aspirates are unacceptable for Xpert Xpress SARS-CoV-2/FLU/RSV testing.  Fact Sheet for Patients: BloggerCourse.com  Fact Sheet for Healthcare Providers: SeriousBroker.it  This test is not yet approved or cleared by the [place] FDA and has been authorized for detection and/or diagnosis of SARS-CoV-2 by FDA under an Emergency Use Authorization (EUA). This EUA will remain in effect (meaning this test can be used) for the duration of the COVID-19 declaration under Section 564(b)(1) of the Act, 21 U.S.C. section 360bbb-3(b)(1), unless the authorization is terminated or revoked.  Performed at [facility], [address] [place], [place] [address]   C Difficile Quick Screen w PCR reflex     Status: None   Collection Time: 06/04/21 10:41 AM   Specimen:  STOOL  Result Value Ref Range Status   C Diff antigen NEGATIVE NEGATIVE Corrected   C Diff toxin NEGATIVE NEGATIVE Corrected   C Diff interpretation No C. difficile detected.  Corrected    Comment: Performed at [facility], [address] [place], [place] [address]    Gardiner Barefoot, MD [facility] [email or web address] 06/04/2021, 3:21 PM

## 2021-06-04 NOTE — Consult Note (Signed)
[facility]  [phone]   HEMATOLOGY ONCOLOGY INPATIENT CONSULTATION   Diane Wells  DOB: 1981-03-10  MR#: [id number]  CSN#: [id number]    Requesting Physician: [facility]  Patient Care Team: Copland, Gay Filler, MD as PCP - General (Family Medicine)  Reason for consult: Severe neutropenia and cervical adenopathy  History of present illness:   41 year old female with known history of rheumatoid arthritis, GPA, HTN and CKD presented with recurrent fever, fatigue and a mild sore.  I was called to evaluate her severe neutropenia.  She was diagnosed with rheumatoid arthritis about 3 years ago, and granulomatosis with polyangiitis about 1-2 years ago, she has received multiple immunosuppressant, including steroids, Humira, Rituxan in the past, and has been on CellCept and Plaquenil for over a year.  She has been on stable maintenance dose CellCept 1500 mg twice daily for several months.  Per patient, her CBC was normal in February 2023, and she never had a neutropenia before.  She presented with high fever, and an enlarged painful lymph nodes in the right upper neck about 5 days ago, she was seen in the urgent care, CBC was not done, but COVID, influenza, and strep both negative.  She was prescribed with Augmentin.  She developed diarrhea, abdominal pain a few days later, was seen by her PCP yesterday morning, labs showed severe neutropenia with ANC 0, she was sent to ED for further evaluation and was subsequently admitted.   MEDICAL HISTORY:  Past Medical History:  Diagnosis Date   Allergic rhinitis    Asthma    Chronic kidney disease    Granulomatosis with polyangiitis (HCC)    Hypertension    Recurrent upper respiratory infection (URI)     SURGICAL HISTORY: Past Surgical History:  Procedure Laterality Date   ADENOIDECTOMY     ANTERIOR CERVICAL DECOMP/DISCECTOMY FUSION      SOCIAL HISTORY: Social History   Socioeconomic History   Marital  status: Single    Spouse name: Not on file   Number of children: 0   Years of education: Not on file   Highest education level: Not on file  Occupational History   Not on file  Tobacco Use   Smoking status: Never   Smokeless tobacco: Never  Vaping Use   Vaping Use: Never used  Substance and Sexual Activity   Alcohol use: Yes    Comment: Rare   Drug use: No   Sexual activity: Not Currently    Birth control/protection: None  Other Topics Concern   Not on file  Social History Narrative   Not on file   Social Determinants of Health   Financial Resource Strain: Not on file  Food Insecurity: Not on file  Transportation Needs: Not on file  Physical Activity: Not on file  Stress: Not on file  Social Connections: Not on file  Intimate Partner Violence: Not on file    FAMILY HISTORY: Family History  Problem Relation Age of Onset   Diabetes Mother        Type 1 Diabetes, juvenile onset   Hypertension Mother    Pulmonary Hypertension Mother    Hyperthyroidism Mother    Glaucoma Mother    Neuropathy Mother    Sjogren's syndrome Mother    Carpal tunnel syndrome Mother    Hypertension Father    Gallbladder disease Father    Hypertension Maternal Grandmother    Glaucoma Maternal Grandmother    Coronary artery disease Maternal Grandmother  Heart attack Maternal Grandmother    Angina Maternal Grandmother    Macular degeneration Maternal Grandmother    COPD Maternal Grandmother    Congestive Heart Failure Maternal Grandmother    Glaucoma Maternal Grandfather    Leukemia Paternal Grandmother    Hypertension Paternal Grandmother    Colon cancer Paternal Grandfather    Diabetes Maternal Uncle    Heart attack Maternal Uncle    Stroke Maternal Uncle    Allergic rhinitis Neg Hx    Angioedema Neg Hx    Asthma Neg Hx    Eczema Neg Hx     ALLERGIES:  is allergic to albuterol, other, truxima [rituximab], sulfa antibiotics, and vancomycin.  MEDICATIONS:  Current  Facility-Administered Medications  Medication Dose Route Frequency Provider Last Rate Last Admin   0.9 %  sodium chloride infusion   Intravenous Continuous Regalado, Belkys A, MD 100 mL/hr at 06/04/21 0807 New Bag at 06/04/21 0807   albuterol (VENTOLIN HFA) 108 (90 Base) MCG/ACT inhaler 1-2 puff  1-2 puff Inhalation Q4H PRN Marcelyn Bruins, MD       ceFEPIme (MAXIPIME) 2 g in sodium chloride 0.9 % 100 mL IVPB  2 g Intravenous Q8H Marcelyn Bruins, MD 200 mL/hr at 06/04/21 1113 2 g at 06/04/21 1113   diphenhydrAMINE (BENADRYL) capsule 25 mg  25 mg Oral Q6H PRN Regalado, Belkys A, MD   25 mg at 06/04/21 0955   EPINEPHrine (EPI-PEN) injection 0.3 mg  0.3 mg Intramuscular Once PRN Marcelyn Bruins, MD       hydrocortisone (CORTEF) tablet 10 mg  10 mg Oral Daily Marcelyn Bruins, MD   10 mg at 06/04/21 0806   hydrocortisone (CORTEF) tablet 5 mg  5 mg Oral q1800 Shalhoub, Sherryll Burger, MD       [START ON 06/05/2021] linezolid (ZYVOX) IVPB 600 mg  600 mg Intravenous Q12H Regalado, Belkys A, MD       magic mouthwash w/lidocaine  5 mL Oral QID Regalado, Belkys A, MD   5 mL at 06/04/21 0914   mometasone-formoterol (DULERA) 200-5 MCG/ACT inhaler 2 puff  2 puff Inhalation BID Marcelyn Bruins, MD   2 puff at 06/04/21 0808   ondansetron (ZOFRAN) injection 4 mg  4 mg Intravenous Q6H PRN Regalado, Belkys A, MD   4 mg at 06/04/21 1111   oxyCODONE-acetaminophen (PERCOCET/ROXICET) 5-325 MG per tablet 1-2 tablet  1-2 tablet Oral Q6H PRN Marcelyn Bruins, MD   2 tablet at 06/04/21 0421    REVIEW OF SYSTEMS:   Constitutional: Denies fevers, chills or abnormal night sweats, (+) fever  Eyes: Denies blurriness of vision, double vision or watery eyes Ears, nose, mouth, throat, and face: (+) mouth sore  Respiratory: Denies cough, dyspnea or wheezes Cardiovascular: Denies palpitation, chest discomfort or lower extremity swelling Gastrointestinal:  (+) low abdominal pain and diarrhea  Skin: Denies abnormal  skin rashes Lymphatics: (+) adenopathy in right upper neck. No easy bruising Neurological:Denies numbness, tingling or new weaknesses Behavioral/Psych: Mood is stable, no new changes  All other systems were reviewed with the patient and are negative.  PHYSICAL EXAMINATION: ECOG PERFORMANCE STATUS: 1 - Symptomatic but completely ambulatory  Vitals:   06/04/21 0333 06/04/21 0856  BP: 137/84 114/85  Pulse: (!) 115 98  Resp: 18 16  Temp: 100 F (37.8 C) 98.8 F (37.1 C)  SpO2: 98% 99%   Filed Weights   06/03/21 1705  Weight: 295 lb (133.8 kg)    GENERAL:alert, no distress and comfortable  SKIN: skin color, texture, turgor are normal, no rashes or significant lesions EYES: normal, conjunctiva are pink and non-injected, sclera clear NECK: supple, thyroid normal size, non-tender, (+) 1-2 cm node in right upper lateral neck  LYMPH:  no palpable lymphadenopathy in the cervical, axillary or inguinal LUNGS: clear to auscultation and percussion with normal breathing effort HEART: regular rate & rhythm and no murmurs and no lower extremity edema ABDOMEN:abdomen soft, mild diffuse tenderness in low abdomen, normal bowel sounds Musculoskeletal:no cyanosis of digits and no clubbing  PSYCH: alert & oriented x 3 with fluent speech NEURO: no focal motor/sensory deficits  LABORATORY DATA:  I have reviewed the data as listed Lab Results  Component Value Date   WBC 1.9 (L) 06/04/2021   HGB 11.6 (L) 06/04/2021   HCT 36.0 06/04/2021   MCV 84.9 06/04/2021   PLT 227 06/04/2021   Recent Labs    06/03/21 1104 06/03/21 1750 06/04/21 0808  NA 137 135 134*  K 3.7 4.2 3.8  CL 100 101 104  CO2 _0 GLUCOSE 94 100* 100*  BUN _1 CREATININE 1.20 1.29* 1.05*  CALCIUM 8.6 8.6* 7.9*  GFRNONAA  --  54* >60  PROT 6.3 7.3 6.2*  ALBUMIN 3.7 3.7 3.1*  AST 11 14* 13*  ALT _2 ALKPHOS 42 45 36*  BILITOT 0.3 0.5 0.4  BILIDIR  --  0.1  --   IBILI  --  0.4  --     RADIOGRAPHIC  STUDIES: I have personally reviewed the radiological images as listed and agreed with the findings in the report. DG Chest 2 View  Result Date: 06/03/2021 CLINICAL DATA:  Fever EXAM: CHEST - 2 VIEW COMPARISON:  10/24/2019 FINDINGS: Heart and mediastinal contours are within normal limits. No focal opacities or effusions. No acute bony abnormality. IMPRESSION: No active cardiopulmonary disease. Electronically Signed   By: Rolm Baptise M.D.   On: 06/03/2021 19:12   MM DIAG BREAST TOMO BILATERAL  Result Date: 05/25/2021 CLINICAL DATA:  Delayed followup for probably benign mass in the RIGHT breast. Patient was originally recalled after screening study February of 2020. EXAM: DIGITAL DIAGNOSTIC BILATERAL MAMMOGRAM WITH TOMOSYNTHESIS AND CAD TECHNIQUE: Bilateral digital diagnostic mammography and breast tomosynthesis was performed. The images were evaluated with computer-aided detection. COMPARISON:  Previous exam(s). ACR Breast Density Category b: There are scattered areas of fibroglandular density. FINDINGS: No suspicious mass, distortion, or microcalcifications are identified to suggest presence of malignancy. IMPRESSION: No mammographic evidence for malignancy in either breast. RECOMMENDATION: Patient will return for RIGHT breast ultrasound to complete follow-up of probably benign mass in the RIGHT breast. I have discussed the findings and recommendations with the patient. If applicable, a reminder letter will be sent to the patient regarding the next appointment. BI-RADS CATEGORY  0: Incomplete. Need additional imaging evaluation and/or prior mammograms for comparison. Electronically Signed   By: Nolon Nations M.D.   On: 05/25/2021 16:48   ASSESSMENT & PLAN:   Fever, mucositis, and upper cervical adenopathy Severe new onset of neutropenia with ANC 0 Rheumatoid arthritis, GPA Hypertension Asthma Obesity   Plan:  -Per patient her CBC was normal less than a month ago, her severe neutropenia appears  to be acute.  We discussed the, etiology for acute neutropenia, including infection, medication induced, autoimmune related, and primary bone marrow diseases.  She was on CellCept, which can cause marrow suppression, but she previously tolerated well.  The only new medicine is Augmentin in the  past week, which is unlikely the cause of neutropenia -I personally reviewed her peripheral blood smear this morning, which showed severe neutropenia, some atypical lymphocytes, no evidence of acute leukemia to my eyes, will ask pathology review on Monday. -Given her extensive underlying rheumatological disorder, especially rheumatoid arthritis, autoimmune related neutropenia, such as Felty syndrome, is probable the etiology of her neutropenia.  Given the recent onset fever, viral infection induced neutropenia is also a possibility.  -I agree with broad antibiotics due to her fever and severe neutropenia.  I will start her on Granix 433mg daily until ANC>1.5 -I will order CT chest, abdomen pelvis to evaluate additional adenopathy.  Due to her previous immunosuppressants, malignancies especially lymphoproliferative disorders needs to be ruled out. -I will continue follow-up, if the above work-up is negative and she clinically does not improve with antibiotics in 3 to 4 days, it is also reasonable to consider bone marrow biopsy.   Recommendations: -We will start her on Granix -CT chest, abdomen pelvis with contrast -We will follow-up, will decide if she needs BM biopsy next week   All questions were answered. The patient knows to call the clinic with any problems, questions or concerns.      YTruitt Merle MD 06/04/2021 12:27 PM

## 2021-06-04 NOTE — Assessment & Plan Note (Addendum)
Continue with Hydrocortisone 10 mg in am and 5 mg afternoon.  She takes 5 mh hydrocortisone at home. Dose was increase due to acute illness.  I discussed with Dr. Sharl Ma,  ok to use hydrocortisone 10 mg in am and 5 mg afternoon. Taper down as needed, depending on clinical stability. If she is discharge home soon, ok to discharge on 10 mg daily for 2 -3 days after discharge. Follow up with him after discharge.  -Consider transition to hydrocortisone 10 mg daily from 3/15 if stable.

## 2021-06-04 NOTE — Progress Notes (Signed)
Pharmacy Antibiotic Note ? ?Diane Wells is a 41 y.o. female admitted on 06/03/2021 with past medical history of granulomatosis with polyangiitis as well as rheumatoid arthritis on CellCept and Plaquenil presenting to med [facility] emergency department with complaints of fevers and malaise.Marland Kitchen  Pharmacy has been consulted to dose cefepime for febrile neutropenia. ? ?Plan: ?Cefepime 2gm IV q8h ?Follow renal function and clinical course ? ?Height: 5\' 7"  (170.2 cm) ?Weight: 133.8 kg (295 lb) ?IBW/kg (Calculated) : 61.6 ? ?Temp (24hrs), Avg:99.1 ?F (37.3 ?C), Min:97.8 ?F (36.6 ?C), Max:100.9 ?F (38.3 ?C) ? ?Recent Labs  ?Lab 06/03/21 ?1104 06/03/21 ?1750 06/03/21 ?2156  ?WBC 1.8 Repeated and verified X2.* 1.5*  --   ?CREATININE 1.20 1.29*  --   ?LATICACIDVEN  --   --  1.3  ?  ?Estimated Creatinine Clearance: 82.8 mL/min (A) (by C-G formula based on SCr of 1.29 mg/dL (H)).   ? ?Allergies  ?Allergen Reactions  ? Albuterol Other (See Comments) and Shortness Of Breath  ? Other Anaphylaxis  ?  Essential Oil, scent unknown, caused tongue swelling  ? Truxima [Rituximab] Anaphylaxis  ? Sulfa Antibiotics   ? ? ? ?Thank you for allowing pharmacy to be a part of this patient?s care. ? ?Arley Phenix RPh ?06/04/2021, 12:41 AM ? ?

## 2021-06-04 NOTE — Progress Notes (Signed)
?   06/03/21 2330  ?Assess: MEWS Score  ?Temp (!) 100.9 ?F (38.3 ?C)  ?BP 134/89  ?Pulse Rate (!) 114  ?Resp 16  ?Level of Consciousness Alert  ?SpO2 99 %  ?O2 Device Room Air  ?Assess: MEWS Score  ?MEWS Temp 1  ?MEWS Systolic 0  ?MEWS Pulse 2  ?MEWS RR 0  ?MEWS LOC 0  ?MEWS Score 3  ?MEWS Score Color Yellow  ?Assess: if the MEWS score is Yellow or Red  ?Were vital signs taken at a resting state? Yes  ?Focused Assessment No change from prior assessment ?(pt recieved from [place])  ?Does the patient meet 2 or more of the SIRS criteria? Yes  ?Does the patient have a confirmed or suspected source of infection? No  ?MEWS guidelines implemented *See Row Information* Yes  ?Treat  ?MEWS Interventions Escalated (See documentation below)  ?Pain Scale 0-10  ?Pain Score 5  ?Pain Type Acute pain  ?Pain Location Mouth  ?Pain Descriptors / Indicators Sore  ?Pain Frequency Constant  ?Pain Onset On-going  ?Pain Intervention(s) MD notified (Comment)  ?Multiple Pain Sites Yes  ?2nd Pain Site  ?Pain Score 4  ?Pain Type Acute pain  ?Pain Location Neck  ?Pain Orientation Right  ?Pain Descriptors / Indicators Aching  ?Pain Frequency Constant  ?Pain Onset On-going  ?Pain Intervention(s) MD notified (Comment)  ?Take Vital Signs  ?Increase Vital Sign Frequency  Yellow: Q 2hr X 2 then Q 4hr X 2, if remains yellow, continue Q 4hrs  ?Escalate  ?MEWS: Escalate Yellow: discuss with charge nurse/RN and consider discussing with provider and RRT  ?Notify: Charge Nurse/RN  ?Name of Charge Nurse/RN Notified [place], RN  ?Date Charge Nurse/RN Notified 06/04/21  ?Time Charge Nurse/RN Notified 0020  ?Notify: Provider  ?Provider Name/Title Earley Favor, MD  ?Date Provider Notified 06/04/21  ?Time Provider Notified 0020  ?Notification Type  ?(Secure Chat)  ?Notification Reason Change in status ?(tachycardia, and increase in temp)  ?Provider response At bedside  ?Date of Provider Response 06/04/21  ?Time of Provider Response [phone]  ?Notify: Rapid Response   ?Name of Rapid Response RN Notified  ?(No need)  ?Date Rapid Response Notified  ?(no need)  ?Time Rapid Response Notified  ?(no need)  ?Document  ?Progress note created (see row info) Yes  ?Assess: SIRS CRITERIA  ?SIRS Temperature  0  ?SIRS Pulse 1  ?SIRS Respirations  0  ?SIRS WBC 1  ?SIRS Score Sum  2  ? ?No distress noted, MD at bedside. Waiting for orders. Will continue to monitor. ?

## 2021-06-04 NOTE — Assessment & Plan Note (Addendum)
Presents with fever, neutropenia, tachycardia.  ?Continue with broad spectrum antibiotics.  ?Blood culture: no growth to date. , C diff negative, GI pathogen negative. Urine culture no growth.  ?CT chest, abdomen, negative for infection.  ?

## 2021-06-04 NOTE — Assessment & Plan Note (Addendum)
Holding  Cozaar in setting of infection.  If BP remain elevated will resume Cozaar.

## 2021-06-04 NOTE — Assessment & Plan Note (Signed)
Hold cellcept and plaquenil./  ?

## 2021-06-04 NOTE — Progress Notes (Signed)
CRITICAL VALUE STICKER ? ?CRITICAL VALUE:Absolute neutrophils of 0.0 ? ?RECEIVER (on-site recipient of call): Ceasar LundJessica Franco Duley, RN   ? ?DATE & TIME NOTIFIED: 06/04/2021 AT 0900 ? ?MESSENGER (representative from lab): ? ?MD NOTIFIED: [place]  ? ?TIME OF NOTIFICATION: 0900 ? ?RESPONSE: See new orders  ? ?

## 2021-06-04 NOTE — Plan of Care (Signed)
?  Problem: Education: ?Goal: Knowledge of General Education information will improve ?Description: Including pain rating scale, medication(s)/side effects and non-pharmacologic comfort measures ?Outcome: Progressing ?  ?Problem: Activity: ?Goal: Risk for activity intolerance will decrease ?Outcome: Progressing ?  ?Problem: Clinical Measurements: ?Goal: Ability to maintain clinical measurements within normal limits will improve ?Outcome: Progressing ?  ?

## 2021-06-04 NOTE — Assessment & Plan Note (Addendum)
Patient presents with fever, tachycardia, neutropenia white blood cell 1.5 ANC 0.0. Lymphadenopathy/ oral ulcers.  Blood cultures: No growth to date.  Chest x-ray negative for acute infection. Continue with Cefepime and Linezolid. Had itching with vancomycin.  -She had 2-3 small oral ulcers white plaque. Started on magic mouth wash, fluconazole.  -ID consulted.  -Appreciate Dr Burr Medico evaluation -CT abdomen-pelvis chest, no lymphadenopathy, splenomegaly, thyroid nodule, right ovary cyst.  -Started on Granix 3/11 -ANC; 0.1 -In regards  Felty syndrome, Discussed with Dr Trudie Reed, neutropenia could be related to infection, autoimmune process, medications. Felty syndrome maybe, patient has not required significant medications management for her RA, her treatment has been to target her vasculitis. She agrees with hematologist evaluation. -Patient is asking about been tested for ticks born disease, lyme. She works at [employer]. Dr Baxter Flattery with ID will evaluate further.

## 2021-06-04 NOTE — Progress Notes (Signed)
?  Progress Note ? ? ?Patient: Diane Wells ZOX:[id number] DOB: Nov 17, 1980 DOA: 06/03/2021     0 ?DOS: the patient was seen and examined on 06/04/2021 ?  ?Brief hospital course: ?41 year old with past medical history significant for asthma, granulomatosis with polyangiitis, arthritis, iatrogenic Cushing's, hypertension, CKD presents with persistence fever, fatigue and abnormal labs.  5 days ago she developed fevers, sinus pressure, she went to urgent care and was a started on Augmentin.  She also subsequently developed oral lesions, and decreased oral intake.  Saw her PCP and lab work revealed leukopenia ANC 0.0 she was referred for admission. ? ?She has not been taking her CellCept and Paxil due to her acute illness. ? ?Patient was found to be tachycardic, febrile, creatinine 1.2, white blood cell 1.5 ANC 0.0.  ESR 25, Monospot test negative.  Respiratory panel and COVID-negative.  Chest x-ray no acute abnormality. ? ?Admitted with neutropenic fever.  ? ?Assessment and Plan: ?* Neutropenic fever ([place]) ?Patient presents with fever, tachycardia, neutropenia white blood cell 1.5 ANC 0.0. Lymphadenopathy/  ?Blood cultures ordered ?Chest x-ray negative for acute infection ?She was a started on cefepime.  I ordered vancomycin but patient developed itching and could not tolerated.  Started on linezolid. ?-She had 2-3 small oral ulcers white plaque. Started on magic mouth wash.  ?-ID consulted.  ?-Appreciate Dr Burr Medico evaluation: checking CT abdomen-pelvis chest, started on Granix.  ? ?Secondary adrenal insufficiency ([place]) ?Continue with Hydrocortisone 10 mg in am and 5 mg afternoon.  ?If she develops hypotension or worsening vomiting, she will need stress dose steroids.  ? ?Rheumatoid arteritis ([place]) ?Hold cellcept and plaquenil./  ? ?Essential (primary) hypertension ?Hold Cozaar in setting of infection.  ? ?SIRS (systemic inflammatory response syndrome) (HCC) ?Presents with fever, neutropenia, tachycardia.  ?Continue with  broad spectrum antibiotics.  ?Check blood culture, C diff, GI pathogen.  ?Oncology recommends CT abdomen and pelvis.  ? 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  ?Follows with Dr Trudie Reed rheumatology/.   ? ?Moderate persistent asthma without complication ?Continue with Dulera and PRN albuterol.  ? ? ? ? ?  ? ?Subjective: she develops itching in her scalp after she was started on Vancomycin.  ?She report oral ulcers, has 2-3 white plaques ulcer.  ?She has lymphadenopathy.  ?Report diarrhea, since she was started on antibiotics (Augmentin) ? ? ?Physical Exam: ?Vitals:  ? 06/03/21 2330 06/04/21 0128 06/04/21 8119 06/04/21 0856  ?BP: 134/89 123/83 137/84 114/85  ?Pulse: (!) 114 (!) 107 (!) 115 98  ?Resp: _0 ?Temp: (!) 100.9 ?F (38.3 ?C) 99 ?F (37.2 ?C) 100 ?F (37.8 ?C) 98.8 ?F (37.1 ?C)  ?TempSrc: Oral Oral Oral Oral  ?SpO2: 99% 100% 98% 99%  ?Weight:      ?Height:      ? ?General; NAD ?Oral mucosa; small ulcers white plaque.  ?CVS; S 1, S 2 RRR ?Lungs; CTA ? ?Data Reviewed: ? ?Cbc, bmet reviewed.  ? ?Family Communication: Care discussed with patient.  ? ?Disposition: ?Status is: Observation ?The patient will require care spanning > 2 midnights and should be moved to inpatient because: admitted with neutropenic fevers.  ? Planned Discharge Destination: Home ? ? ? ?Time spent: 45 minutes ? ?Author: ?Elmarie Shiley, MD ?06/04/2021 3:48 PM ? ?For on call review [email or web address].  ?

## 2021-06-04 NOTE — Assessment & Plan Note (Addendum)
Continue to hold Plaquenil and Cellcept due to neutropenic fever.  ?Follows with Dr Nickola Major rheumatology/.   ?Discussed with Dr Nickola Major, ok to hold Cellcept and plaquenil.  ?

## 2021-06-04 NOTE — Plan of Care (Signed)

## 2021-06-04 NOTE — Progress Notes (Signed)
Pharmacy Antibiotic Note ? ?Diane Wells is a 41 y.o. female admitted on 06/03/2021 with past medical history of granulomatosis with polyangiitis as well as rheumatoid arthritis on CellCept and Plaquenil presenting to med [facility] emergency department with complaints of fevers and malaise.  Pharmacy has been consulted to dose cefepime and vancomycin for febrile neutropenia. ? ?Plan: ?Cefepime 2gm IV q8h ?Vancomycin 2g IV x1 then vancomycin 1750 mg IV q24h.  (SCr 1.29, eAUC 511) ?Measure Vanc levels as needed.  Goal AUC = 400 - 550 ?Follow up renal function, culture results, and clinical course. ? ? ?Height: 5\' 7"  (170.2 cm) ?Weight: 133.8 kg (295 lb) ?IBW/kg (Calculated) : 61.6 ? ?Temp (24hrs), Avg:99.2 ?F (37.3 ?C), Min:97.8 ?F (36.6 ?C), Max:100.9 ?F (38.3 ?C) ? ?Recent Labs  ?Lab 06/03/21 ?1104 06/03/21 ?1750 06/03/21 ?2156 06/03/21 ?2351  ?WBC 1.8 Repeated and verified X2.* 1.5*  --   --   ?CREATININE 1.20 1.29*  --   --   ?LATICACIDVEN  --   --  1.3 0.7  ? ?  ?Estimated Creatinine Clearance: 82.8 mL/min (A) (by C-G formula based on SCr of 1.29 mg/dL (H)).   ? ?Allergies  ?Allergen Reactions  ? Albuterol Other (See Comments) and Shortness Of Breath  ? Other Anaphylaxis  ?  Essential Oil, scent unknown, caused tongue swelling  ? Truxima [Rituximab] Anaphylaxis  ? Sulfa Antibiotics   ? ?Antimicrobials this admission:  ?3/10 Cefepime >>  ?3/11 Vancomycin >>  ? ?Dose adjustments this admission:  ? ?Microbiology results:  ?3/10 Covid neg; influenza neg ?3/11 BCx: ?3/11 UCx: ? ? ?Thank you for allowing pharmacy to be a part of this patient?s care. ? ?Lynann Beaver PharmD, BCPS ?Clinical Pharmacist ?[facility] [phone] ?06/04/2021 7:11 AM ? ? ?

## 2021-06-04 NOTE — Hospital Course (Addendum)
41 year old with past medical history significant for asthma, granulomatosis with polyangiitis, arthritis, iatrogenic Cushing's, hypertension, CKD presents with persistence fever, fatigue and abnormal labs.  5 days ago she developed fevers, sinus pressure, she went to urgent care and was a started on Augmentin.  She also subsequently developed oral lesions, and decreased oral intake.  Saw her PCP and lab work revealed leukopenia ANC 0.0 she was referred for admission.  She has not been taking her CellCept and Paxil due to her acute illness.  Patient was found to be tachycardic, febrile, creatinine 1.2, white blood cell 1.5 ANC 0.0.  ESR 25, Monospot test negative.  Respiratory panel and COVID-negative.  Chest x-ray no acute abnormality.  Admitted with neutropenic fever. ID and oncology following. Started on broad spectrum antibiotics. Started on Granix.

## 2021-06-05 ENCOUNTER — Inpatient Hospital Stay (HOSPITAL_COMMUNITY): Payer: BC Managed Care – PPO

## 2021-06-05 DIAGNOSIS — E041 Nontoxic single thyroid nodule: Secondary | ICD-10-CM

## 2021-06-05 DIAGNOSIS — N83201 Unspecified ovarian cyst, right side: Secondary | ICD-10-CM

## 2021-06-05 LAB — CBC WITH DIFFERENTIAL/PLATELET
Abs Immature Granulocytes: 0.01 10*3/uL (ref 0.00–0.07)
Abs Immature Granulocytes: 0 10*3/uL (ref 0.00–0.07)
Basophils Absolute: 0 10*3/uL (ref 0.0–0.1)
Basophils Absolute: 0 10*3/uL (ref 0.0–0.1)
Basophils Relative: 1 %
Basophils Relative: 1 %
Eosinophils Absolute: 0.1 10*3/uL (ref 0.0–0.5)
Eosinophils Absolute: 0.1 10*3/uL (ref 0.0–0.5)
Eosinophils Relative: 3 %
Eosinophils Relative: 3 %
HCT: 36 % (ref 36.0–46.0)
HCT: 36.4 % (ref 36.0–46.0)
Hemoglobin: 11.6 g/dL — ABNORMAL LOW (ref 12.0–15.0)
Hemoglobin: 11.7 g/dL — ABNORMAL LOW (ref 12.0–15.0)
Immature Granulocytes: 1 %
Immature Granulocytes: 0 %
Lymphocytes Relative: 57 %
Lymphocytes Relative: 58 %
Lymphs Abs: 1.1 10*3/uL (ref 0.7–4.0)
Lymphs Abs: 1.1 10*3/uL (ref 0.7–4.0)
MCH: 27.4 pg (ref 26.0–34.0)
MCH: 27.5 pg (ref 26.0–34.0)
MCHC: 32.2 g/dL (ref 30.0–36.0)
MCHC: 32.1 g/dL (ref 30.0–36.0)
MCV: 84.9 fL (ref 80.0–100.0)
MCV: 85.4 fL (ref 80.0–100.0)
Monocytes Absolute: 0.7 10*3/uL (ref 0.1–1.0)
Monocytes Absolute: 0.6 10*3/uL (ref 0.1–1.0)
Monocytes Relative: 36 %
Monocytes Relative: 35 %
Neutro Abs: 0 10*3/uL — CL (ref 1.7–7.7)
Neutro Abs: 0.1 10*3/uL — CL (ref 1.7–7.7)
Neutrophils Relative %: 2 %
Neutrophils Relative %: 3 %
Platelet Morphology: NORMAL
Platelets: 227 10*3/uL (ref 150–400)
Platelets: 226 10*3/uL (ref 150–400)
RBC: 4.24 MIL/uL (ref 3.87–5.11)
RBC: 4.26 MIL/uL (ref 3.87–5.11)
RDW: 13.6 % (ref 11.5–15.5)
RDW: 13.7 % (ref 11.5–15.5)
WBC: 1.9 10*3/uL — ABNORMAL LOW (ref 4.0–10.5)
WBC: 1.8 10*3/uL — ABNORMAL LOW (ref 4.0–10.5)
nRBC: 0 % (ref 0.0–0.2)
nRBC: 0 % (ref 0.0–0.2)

## 2021-06-05 LAB — BASIC METABOLIC PANEL
Anion gap: 8 (ref 5–15)
BUN: 10 mg/dL (ref 6–20)
CO2: 21 mmol/L — ABNORMAL LOW (ref 22–32)
Calcium: 7.9 mg/dL — ABNORMAL LOW (ref 8.9–10.3)
Chloride: 104 mmol/L (ref 98–111)
Creatinine, Ser: 0.87 mg/dL (ref 0.44–1.00)
GFR, Estimated: >60 mL/min (ref >60)
Glucose, Bld: 99 mg/dL (ref 70–99)
Potassium: 3.4 mmol/L — ABNORMAL LOW (ref 3.5–5.1)
Sodium: 133 mmol/L — ABNORMAL LOW (ref 135–145)

## 2021-06-05 LAB — GASTROINTESTINAL PANEL BY PCR, STOOL (REPLACES STOOL CULTURE)

## 2021-06-05 LAB — URINE CULTURE: Culture: NO GROWTH

## 2021-06-05 MED ORDER — POTASSIUM CHLORIDE CRYS ER 20 MEQ PO TBCR
40.0000 meq | EXTENDED_RELEASE_TABLET | Freq: Once | ORAL | Status: AC
  Administered 2021-06-05: 40 meq via ORAL
  Filled 2021-06-05: qty 2

## 2021-06-05 MED ORDER — SIMETHICONE 80 MG PO CHEW
80.0000 mg | CHEWABLE_TABLET | Freq: Four times a day (QID) | ORAL | Status: DC | PRN
  Administered 2021-06-05 (×2): 80 mg via ORAL
  Filled 2021-06-05 (×2): qty 1

## 2021-06-05 MED ORDER — ENOXAPARIN SODIUM 40 MG/0.4ML IJ SOSY
40.0000 mg | PREFILLED_SYRINGE | INTRAMUSCULAR | Status: DC
  Administered 2021-06-05 – 2021-06-09 (×5): 40 mg via SUBCUTANEOUS
  Filled 2021-06-05 (×6): qty 0.4

## 2021-06-05 NOTE — Assessment & Plan Note (Signed)
Per CT likely functional.  ?Follow out patient.  ?

## 2021-06-05 NOTE — Plan of Care (Signed)
  Problem: Activity: Goal: Risk for activity intolerance will decrease Outcome: Progressing   Problem: Pain Managment: Goal: General experience of comfort will improve Outcome: Progressing   Problem: Clinical Measurements: Goal: Diagnostic test results will improve Outcome: Progressing   

## 2021-06-05 NOTE — Plan of Care (Signed)
  Problem: Coping: Goal: Level of anxiety will decrease Outcome: Progressing   Problem: Pain Managment: Goal: General experience of comfort will improve Outcome: Progressing   

## 2021-06-05 NOTE — Assessment & Plan Note (Addendum)
Thyroid US; Borderline thyromegaly, solitary 2.4 cm TR3 left nodule.  Recommend annual/biennial US>  ?She will follow up with Dr Buddy Duty, endocrinologist.  ?

## 2021-06-05 NOTE — Progress Notes (Signed)
CRITICAL VALUE STICKER ? ?CRITICAL VALUE: ANC 0.1 (yest 0.0) ? ?RECEIVER (on-site recipient of call): ?Deanna Artis, RN  ?DATE & TIME NOTIFIED: 06/05/2021 @0939  ? ?MESSENGER (representative from lab): ? ?MD NOTIFIED:  ?Physician aware from yesterday; informed her of increase ? ?TIME OF NOTIFICATION: [id number] ? ?RESPONSE: cont to monitor  ? ?*also lab reported a correction from yesterday  with lymphocytes, see lab values-informed physician to look  ? ?

## 2021-06-05 NOTE — Progress Notes (Signed)
?  Progress Note ? ? ?Patient: Diane Wells GBE:[id number] DOB: 07/30/1980 DOA: 06/03/2021     1 ?DOS: the patient was seen and examined on 06/05/2021 ?  ?Brief hospital course: ?41 year old with past medical history significant for asthma, granulomatosis with polyangiitis, arthritis, iatrogenic Cushing's, hypertension, CKD presents with persistence fever, fatigue and abnormal labs.  5 days ago she developed fevers, sinus pressure, she went to urgent care and was a started on Augmentin.  She also subsequently developed oral lesions, and decreased oral intake.  Saw her PCP and lab work revealed leukopenia ANC 0.0 she was referred for admission. ? ?She has not been taking her CellCept and Paxil due to her acute illness. ? ?Patient was found to be tachycardic, febrile, creatinine 1.2, white blood cell 1.5 ANC 0.0.  ESR 25, Monospot test negative.  Respiratory panel and COVID-negative.  Chest x-ray no acute abnormality. ? ?Admitted with neutropenic fever.  ? ?Assessment and Plan: ?* Neutropenic fever ([place]) ?Patient presents with fever, tachycardia, neutropenia white blood cell 1.5 ANC 0.0. Lymphadenopathy/  ?Blood cultures ordered ?Chest x-ray negative for acute infection. ?Continue with Cefepime and Linezolid. Had itching with vancomycin.  ?-She had 2-3 small oral ulcers white plaque. Started on magic mouth wash, fluconazole.  ?-ID consulted.  ?-Appreciate Dr Burr Medico evaluation: ?-CT abdomen-pelvis chest, no lymphadenopathy, splenomegaly, thyroid nodule, right ovary cyst.  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 Will contact her Rheumatologist tomorrow.  ? ?Left thyroid nodule ?Will get Thyroid US.  ? ?Cyst of ovary, right ?Per CT likely functional.  ?Follow out patient.  ? ?Secondary adrenal insufficiency ([place]) ?Continue with Hydrocortisone 10 mg in am and 5 mg afternoon.  ?If she develops hypotension or worsening vomiting, she will need stress dose steroids.   ? ?Rheumatoid arteritis ([place]) ?Hold cellcept and plaquenil./  ? ?Essential (primary) hypertension ?Hold Cozaar in setting of infection.  ? ?SIRS (systemic inflammatory response syndrome) (HCC) ?Presents with fever, neutropenia, tachycardia.  ?Continue with broad spectrum antibiotics.  ? blood culture: no growth to date. , C diff negative, GI pathogen pending. Urine culture pending.  ?CT chest, abdomen, negative for infection.  ? 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  ?Follows with Dr Trudie Reed rheumatology/.   ? ?Moderate persistent asthma without complication ?Continue with Dulera and PRN albuterol.  ? ? ? ? ?  ? ?Subjective: she report some abdominal gas. She report less watery BM.  ?She denies cough ? ?Physical Exam: ?Vitals:  ? 06/04/21 0333 06/04/21 0856 06/04/21 2034 06/05/21 0510  ?BP: 137/84 114/85 (!) 150/96 117/70  ?Pulse: (!) 115 98 (!) 103 86  ?Resp: _0 ?Temp: 100 ?F (37.8 ?C) 98.8 ?F (37.1 ?C) 99.8 ?F (37.7 ?C) 98.7 ?F (37.1 ?C)  ?TempSrc: Oral Oral Oral Oral  ?SpO2: 98% 99% 98% 98%  ?Weight:      ?Height:      ? ?General; NAD ?Lungs; CTA ?Abdomen; soft, nt ? ?Data Reviewed: ? ?Cbc bmet reviewed ? ?Family Communication: Care discussed with patient.  ? ?Disposition: ?Status is: Inpatient ?Remains inpatient appropriate because: admitted with neutropenic fever.  ? Planned Discharge Destination: Home ? ? ? ?Time spent: 45 minutes ? ?Author: ?Elmarie Shiley, MD ?06/05/2021 3:28 PM ? ?For on call review [email or web address].  ?

## 2021-06-06 LAB — CBC WITH DIFFERENTIAL/PLATELET
Abs Immature Granulocytes: 0.01 10*3/uL (ref 0.00–0.07)
Basophils Absolute: 0 10*3/uL (ref 0.0–0.1)
Basophils Relative: 1 %
Eosinophils Absolute: 0.1 10*3/uL (ref 0.0–0.5)
Eosinophils Relative: 5 %
HCT: 35 % — ABNORMAL LOW (ref 36.0–46.0)
Hemoglobin: 11 g/dL — ABNORMAL LOW (ref 12.0–15.0)
Immature Granulocytes: 1 %
Lymphocytes Relative: 55 %
Lymphs Abs: 1 10*3/uL (ref 0.7–4.0)
MCH: 27 pg (ref 26.0–34.0)
MCHC: 31.4 g/dL (ref 30.0–36.0)
MCV: 85.8 fL (ref 80.0–100.0)
Monocytes Absolute: 0.6 10*3/uL (ref 0.1–1.0)
Monocytes Relative: 34 %
Neutro Abs: 0.1 10*3/uL — CL (ref 1.7–7.7)
Neutrophils Relative %: 4 %
Platelets: 210 10*3/uL (ref 150–400)
RBC: 4.08 MIL/uL (ref 3.87–5.11)
RDW: 13.4 % (ref 11.5–15.5)
WBC: 1.9 10*3/uL — ABNORMAL LOW (ref 4.0–10.5)
nRBC: 0 % (ref 0.0–0.2)

## 2021-06-06 LAB — COMPREHENSIVE METABOLIC PANEL
ALT: 15 U/L (ref 0–44)
AST: 12 U/L — ABNORMAL LOW (ref 15–41)
Albumin: 2.8 g/dL — ABNORMAL LOW (ref 3.5–5.0)
Alkaline Phosphatase: 35 U/L — ABNORMAL LOW (ref 38–126)
Anion gap: 7 (ref 5–15)
BUN: 7 mg/dL (ref 6–20)
CO2: 22 mmol/L (ref 22–32)
Calcium: 7.7 mg/dL — ABNORMAL LOW (ref 8.9–10.3)
Chloride: 104 mmol/L (ref 98–111)
Creatinine, Ser: 0.88 mg/dL (ref 0.44–1.00)
GFR, Estimated: >60 mL/min (ref >60)
Glucose, Bld: 90 mg/dL (ref 70–99)
Potassium: 3.8 mmol/L (ref 3.5–5.1)
Sodium: 133 mmol/L — ABNORMAL LOW (ref 135–145)
Total Bilirubin: 0.1 mg/dL — ABNORMAL LOW (ref 0.3–1.2)
Total Protein: 5.8 g/dL — ABNORMAL LOW (ref 6.5–8.1)

## 2021-06-06 LAB — CMV ANTIBODY, IGG (EIA): CMV Ab - IgG: 2.7 U/mL — ABNORMAL HIGH (ref 0.00–0.59)

## 2021-06-06 LAB — PATHOLOGIST SMEAR REVIEW

## 2021-06-06 LAB — CMV IGM: CMV IgM: <30 AU/mL (ref 0.0–29.9)

## 2021-06-06 MED ORDER — ENSURE ENLIVE PO LIQD
237.0000 mL | Freq: Two times a day (BID) | ORAL | Status: DC
  Administered 2021-06-06 – 2021-06-10 (×6): 237 mL via ORAL

## 2021-06-06 MED ORDER — MAGIC MOUTHWASH W/LIDOCAINE
5.0000 mL | ORAL | Status: DC
  Administered 2021-06-06 – 2021-06-10 (×23): 5 mL via ORAL
  Filled 2021-06-06 (×26): qty 5

## 2021-06-06 MED ORDER — SODIUM CHLORIDE 0.9 % IV SOLN: INTRAVENOUS | Status: DC | PRN

## 2021-06-06 NOTE — Plan of Care (Signed)
Plan of care reviewed and discussed with the patient. 

## 2021-06-06 NOTE — Plan of Care (Signed)
  Problem: Coping: Goal: Level of anxiety will decrease Outcome: Progressing   Problem: Pain Managment: Goal: General experience of comfort will improve Outcome: Progressing   

## 2021-06-06 NOTE — Progress Notes (Addendum)
HEMATOLOGY-ONCOLOGY PROGRESS NOTE  ASSESSMENT AND PLAN: Fever, mucositis, and upper cervical adenopathy Severe new onset of neutropenia with ANC 0 Rheumatoid arthritis, GPA Hypertension Asthma Obesity   Plan:  -CBC reviewed.  She has persistent neutropenia which appears to be acute.  Etiologies for the acute anemia have been discussed with her including infection, medication induced, autoimmune related, and primary bone diseases.  She was on CellCept which can cause bone marrow suppression but she has been on this for quite some time and has tolerated well.  The only new medication is Augmentin which is unlikely the cause of the neutropenia. -Given her extensive underlying rheumatological disorder, especially rheumatoid arthritis, autoimmune related neutropenia, such as Felty syndrome, is probable the etiology of her neutropenia.  Given the recent onset fever, viral infection induced neutropenia is also a possibility.  -She has been started on Granix 480 mcg subcu daily.  We will plan to continue this medication until [place] is above 1.5. -Continue broad-spectrum antibiotics. -CT chest/abdomen/pelvis did not show any evidence of additional adenopathy.  She had mild splenomegaly. -We will continue to follow.  If she clinically does not begin to improve in the next 1 to 2 days and counts are not improving, may consider bone marrow biopsy.  Mikey Bussing, DNP, AGPCNP-BC, AOCNP  SUBJECTIVE: Overall, not feeling well this morning.  She does have a headache.  She is not having any fevers or chills.  Continues to have mouth ulcers.  Right cervical adenopathy persists but somewhat improved today.  No bleeding reported.  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  She is not sure if she has been exposed, but was wondering this could be a possibility.  She is not aware of any recent tick bites.  REVIEW OF SYSTEMS:   Review of Systems  Constitutional:   Positive for malaise/fatigue. Negative for chills and fever.  Eyes: Negative.   Respiratory: Negative.    Cardiovascular: Negative.   Genitourinary: Negative.   Skin: Negative.   Neurological:  Positive for headaches.  Endo/Heme/Allergies: Negative.   Psychiatric/Behavioral: Negative.     I have reviewed the past medical history, past surgical history, social history and family history with the patient and they are unchanged from previous note.   PHYSICAL EXAMINATION:  Vitals:   06/05/21 2117 06/06/21 0439  BP: 138/86 (!) 148/94  Pulse: 100 100  Resp: 16 16  Temp: 98.9 F (37.2 C) 98.6 F (37 C)  SpO2: 99% 97%   Filed Weights   06/03/21 1705  Weight: 133.8 kg    Intake/Output from previous day: 03/12 0701 - 03/13 0700 In: 4096.8 [P.O.:840; I.V.:2156.1; IV Piggyback:1100.7] Out: -   Physical Exam Vitals reviewed.  Constitutional:      General: She is not in acute distress. HENT:     Head: Normocephalic.     Mouth/Throat:     Mouth: Mucous membranes are moist.     Comments: Plaque-like oral ulcers on her tongue and buccal mucosa Pulmonary:     Effort: Pulmonary effort is normal. No respiratory distress.  Lymphadenopathy:     Cervical: Cervical adenopathy present.     Right cervical: Superficial cervical adenopathy present.  Skin:    General: Skin is warm and dry.  Neurological:     Mental Status: She is alert and oriented to person, place, and time.  Psychiatric:        Mood and Affect: Mood normal.  Behavior: Behavior normal.        Thought Content: Thought content normal.    LABORATORY DATA:  I have reviewed the data as listed CMP Latest Ref Rng & Units 06/06/2021 06/05/2021 06/04/2021  Glucose 70 - 99 mg/dL 90 99 100(H)  BUN 6 - 20 mg/dL '7 10 12  ' Creatinine 0.44 - 1.00 mg/dL 0.88 0.87 1.05(H)  Sodium 135 - 145 mmol/L 133(L) 133(L) 134(L)  Potassium 3.5 - 5.1 mmol/L 3.8 3.4(L) 3.8  Chloride 98 - 111 mmol/L 104 104 104  CO2 22 - 32 mmol/L 22 21(L)  22  Calcium 8.9 - 10.3 mg/dL 7.7(L) 7.9(L) 7.9(L)  Total Protein 6.5 - 8.1 g/dL 5.8(L) - 6.2(L)  Total Bilirubin 0.3 - 1.2 mg/dL 0.1(L) - 0.4  Alkaline Phos 38 - 126 U/L 35(L) - 36(L)  AST 15 - 41 U/L 12(L) - 13(L)  ALT 0 - 44 U/L 15 - 13    Lab Results  Component Value Date   WBC 1.9 (L) 06/06/2021   HGB 11.0 (L) 06/06/2021   HCT 35.0 (L) 06/06/2021   MCV 85.8 06/06/2021   PLT 210 06/06/2021   NEUTROABS 0.1 (LL) 06/06/2021    No results found for: CEA1, CEA, CAN199, CA125, PSA1  DG Chest 2 View  Result Date: 06/03/2021 CLINICAL DATA:  Fever EXAM: CHEST - 2 VIEW COMPARISON:  10/24/2019 FINDINGS: Heart and mediastinal contours are within normal limits. No focal opacities or effusions. No acute bony abnormality. IMPRESSION: No active cardiopulmonary disease. Electronically Signed   By: Rolm Baptise M.D.   On: 06/03/2021 19:12   CT CHEST ABDOMEN PELVIS W CONTRAST  Result Date: 06/04/2021 CLINICAL DATA:  Lymphadenopathy, chest or axilla EXAM: CT CHEST, ABDOMEN, AND PELVIS WITH CONTRAST TECHNIQUE: Multidetector CT imaging of the chest, abdomen and pelvis was performed following the standard protocol during bolus administration of intravenous contrast. RADIATION DOSE REDUCTION: This exam was performed according to the departmental dose-optimization program which includes automated exposure control, adjustment of the mA and/or kV according to patient size and/or use of iterative reconstruction technique. CONTRAST:  14m OMNIPAQUE IOHEXOL 300 MG/ML  SOLN COMPARISON:  None. FINDINGS: CT CHEST FINDINGS Cardiovascular: Heart is normal size. Aorta is normal caliber. Mediastinum/Nodes: No mediastinal, hilar, or axillary adenopathy. Trachea and esophagus are unremarkable. 1.6 cm nodule in the left thyroid lobe. Small hiatal hernia. Lungs/Pleura: Linear scarring or atelectasis in the lung bases. No confluent opacities or effusions. Musculoskeletal: Chest wall soft tissues are unremarkable. No acute bony  abnormality. CT ABDOMEN PELVIS FINDINGS Hepatobiliary: No focal hepatic abnormality. Gallbladder unremarkable. Pancreas: No focal abnormality or ductal dilatation. Spleen: Splenomegaly with a craniocaudal length of 14 cm. No focal abnormality. Adrenals/Urinary Tract: No adrenal abnormality. No focal renal abnormality. No stones or hydronephrosis. Urinary bladder is unremarkable. Stomach/Bowel: Normal appendix. Mild gaseous distention of the right colon and transverse colon with moderate stool burden. Stomach and small bowel decompressed. Vascular/Lymphatic: No evidence of aneurysm or adenopathy. Reproductive: Uterus and left adnexa unremarkable. 3 cm cyst in the right ovary. Other: No free fluid or free air. Musculoskeletal: No acute bony abnormality. IMPRESSION: No adenopathy in the chest, abdomen or pelvis. No acute cardiopulmonary disease. Splenomegaly. 3 cm cyst in the right ovary, likely functional cyst or dominant follicle. 1.6 cm left thyroid nodule. Recommend thyroid [place](ref: J Am Coll Radiol. 2015 Feb;12(2): 143-50). Electronically Signed   By: KRolm BaptiseM.D.   On: 06/04/2021 17:08   MM DIAG BREAST TOMO BILATERAL  Result Date: 05/25/2021 CLINICAL DATA:  Delayed followup for probably benign mass in the RIGHT breast. Patient was originally recalled after screening study February of 2020. EXAM: DIGITAL DIAGNOSTIC BILATERAL MAMMOGRAM WITH TOMOSYNTHESIS AND CAD TECHNIQUE: Bilateral digital diagnostic mammography and breast tomosynthesis was performed. The images were evaluated with computer-aided detection. COMPARISON:  Previous exam(s). ACR Breast Density Category b: There are scattered areas of fibroglandular density. FINDINGS: No suspicious mass, distortion, or microcalcifications are identified to suggest presence of malignancy. IMPRESSION: No mammographic evidence for malignancy in either breast. RECOMMENDATION: Patient will return for RIGHT breast ultrasound to complete follow-up of probably benign  mass in the RIGHT breast. I have discussed the findings and recommendations with the patient. If applicable, a reminder letter will be sent to the patient regarding the next appointment. BI-RADS CATEGORY  0: Incomplete. Need additional imaging evaluation and/or prior mammograms for comparison. Electronically Signed   By: Nolon Nations M.D.   On: 05/25/2021 16:48  US THYROID  Result Date: 06/06/2021 CLINICAL DATA:  Nodule noted on CT chest EXAM: THYROID ULTRASOUND TECHNIQUE: Ultrasound examination of the thyroid gland and adjacent soft tissues was performed. COMPARISON:  CT 06/04/2021 FINDINGS: Parenchymal Echotexture: Moderately heterogenous Isthmus: 0.5 cm thickness Right lobe: 4.1 x 2.4 x 1.2 cm Left lobe: 5.2 x 2.1 x 2.8 cm _________________________________________________________ Estimated total number of nodules >/= 1 cm: 0 Number of spongiform nodules >/=  2 cm not described below (TR1): 0 Number of mixed cystic and solid nodules >/= 1.5 cm not described below (TR2): 0 _________________________________________________________ Nodule # 1: Location: Left; mid Maximum size: 2.4 cm; Other 2 dimensions: 1.8 x 2.3 cm Composition: mixed cystic and solid (1) Echogenicity: hypoechoic (2) Shape: not taller-than-wide (0) Margins: smooth (0) Echogenic foci: none (0) ACR TI-RADS total points: 3. ACR TI-RADS risk category: TR 3. ACR TI-RADS recommendations: *Given size (>/= 1.5 - 2.4 cm) and appearance, a follow-up ultrasound in 1 year should be considered based on TI-RADS criteria. _________________________________________________________ No regional cervical adenopathy identified. IMPRESSION: 1. Borderline thyromegaly with solitary 2.4 cm TR3 left nodule. Recommend annual/biennial ultrasound follow-up as above, until stability x5 years confirmed. The above is in keeping with the ACR TI-RADS recommendations - J Am Coll Radiol 2017;14:587-595. Electronically Signed   By: Lucrezia Europe M.D.   On: 06/06/2021 09:24      No future appointments.    LOS: 2 days   Addendum  I have seen the patient, examined her. I agree with the assessment and and plan and have edited the notes.   Patient remains to be afebrile, still has mild pain, no other new complaints.  Her neutrophil was 0.1K yesterday and today, mild anemia, CT scan was unremarkable, I reviewed with patient.  I think her neutropenia is likely related to her current infection (unknown source yet) or immune mediated, primary bone marrow disease is less likely.  If she is clinically stable, I will likely wait for a few weeks to a month before we do a bone marrow biopsy if she neutrophil still does not recover. She was seen by ID today. I will f/u on Wednesday.   Truitt Merle  06/06/2021

## 2021-06-06 NOTE — Progress Notes (Signed)
?  [facility]   ? ?Date of Admission:  06/03/2021   Total days of antibiotics 4 ?        ? ?ID: VERONA SINKFIELD is a 41 y.o. female with  new onset febrile neutropenia after close contact to respiratory viral infection ?Principal Problem: ?  Neutropenic fever (HCC) ?Active Problems: ?  Moderate persistent asthma without complication ?  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  SIRS (systemic inflammatory response syndrome) (HCC) ?  Essential (primary) hypertension ?  Rheumatoid arteritis (HCC) ?  Secondary adrenal insufficiency (HCC) ?  Cyst of ovary, right ?  Left thyroid nodule ? ? ? ?Subjective: ?Afebrile. Still having pain associated with aphous ulcers to mouth.  ? ?Medications:  ? enoxaparin (LOVENOX) injection  40 mg Subcutaneous Q24H  ? feeding supplement  237 mL Oral BID BM  ? hydrocortisone  10 mg Oral Daily  ? hydrocortisone  5 mg Oral q1800  ? magic mouthwash w/lidocaine  5 mL Oral Q4H  ? mometasone-formoterol  2 puff Inhalation BID  ? Tbo-filgastrim (GRANIX) SQ  480 mcg Subcutaneous q1800  ? ? ?Objective: ?Vital signs in last 24 hours: ?Temp:  [98.3 ?F (36.8 ?C)-98.9 ?F (37.2 ?C)] 98.3 ?F (36.8 ?C) (03/13 1421) ?Pulse Rate:  [93-100] 93 (03/13 1421) ?Resp:  [16] 16 (03/13 1421) ?BP: (138-150)/(86-94) 150/92 (03/13 1421) ?SpO2:  [97 %-99 %] 97 % (03/13 0439) ?Physical Exam  ?Constitutional:  oriented to person, place, and time. appears well-developed and well-nourished. No distress.  ?HENT: [place]/AT, PERRLA, no scleral icterus ?Mouth/Throat: Oropharynx is clear and moist. + buccal mucosa, and small pinpoint lesion to tip of tongue,+ oropharyngeal exudate.  ?Cardiovascular: Normal rate, regular rhythm and normal heart sounds. Exam reveals no gallop and no friction rub.  ?No murmur heard.  ?Pulmonary/Chest: Effort normal and breath sounds normal. No respiratory distress.  has no wheezes.  ?Neck = supple, no nuchal rigidity ?Abdominal: Soft. Bowel sounds  are normal.  exhibits no distension. There is no tenderness.  ?Lymphadenopathy: no cervical adenopathy. No axillary adenopathy ?Neurological: alert and oriented to person, place, and time.  ?Skin: Skin is warm and dry. No rash noted. No erythema. Ecchymosis to right fore arm ?Psychiatric: a normal mood and affect.  behavior is normal.  ? ? ? ?Lab Results ?Recent Labs  ?  06/05/21 ?0326 06/06/21 ?0250  ?WBC 1.8* 1.9*  ?HGB 11.7* 11.0*  ?HCT 36.4 35.0*  ?NA 133* 133*  ?K 3.4* 3.8  ?CL 104 104  ?CO2 21* 22  ?BUN 10 7  ?CREATININE 0.87 0.88  ? ?Liver Panel ?Recent Labs  ?  06/03/21 ?1750 06/04/21 ?0808 06/06/21 ?0250  ?PROT 7.3 6.2* 5.8*  ?ALBUMIN 3.7 3.1* 2.8*  ?AST 14* 13* 12*  ?ALT 12 13 15   ?ALKPHOS 45 36* 35*  ?BILITOT 0.5 0.4 0.1*  ?BILIDIR 0.1  --   --   ?IBILI 0.4  --   --   ? ?Sedimentation Rate ?Recent Labs  ?  06/03/21 ?1750  ?ESRSEDRATE 25*  ? ?C-Reactive Protein ?Recent Labs  ?  06/03/21 ?1952  ?CRP 14.2*  ? ? ?Microbiology: ?06/04/21: NGTD ?Studies/Results: ?CT CHEST ABDOMEN PELVIS W CONTRAST ? ?Result Date: 06/04/2021 ?CLINICAL DATA:  Lymphadenopathy, chest or axilla EXAM: CT CHEST, ABDOMEN, AND PELVIS WITH CONTRAST TECHNIQUE: Multidetector CT imaging of the chest, abdomen and pelvis was performed following the standard protocol during bolus administration of intravenous contrast. RADIATION DOSE REDUCTION: This exam was performed according to the departmental dose-optimization  program which includes automated exposure control, adjustment of the mA and/or kV according to patient size and/or use of iterative reconstruction technique. CONTRAST:  100mL OMNIPAQUE IOHEXOL 300 MG/ML  SOLN COMPARISON:  None. FINDINGS: CT CHEST FINDINGS Cardiovascular: Heart is normal size. Aorta is normal caliber. Mediastinum/Nodes: No mediastinal, hilar, or axillary adenopathy. Trachea and esophagus are unremarkable. 1.6 cm nodule in the left thyroid lobe. Small hiatal hernia. Lungs/Pleura: Linear scarring or atelectasis in the  lung bases. No confluent opacities or effusions. Musculoskeletal: Chest wall soft tissues are unremarkable. No acute bony abnormality. CT ABDOMEN PELVIS FINDINGS Hepatobiliary: No focal hepatic abnormality. Gallbladder unremarkable. Pancreas: No focal abnormality or ductal dilatation. Spleen: Splenomegaly with a craniocaudal length of 14 cm. No focal abnormality. Adrenals/Urinary Tract: No adrenal abnormality. No focal renal abnormality. No stones or hydronephrosis. Urinary bladder is unremarkable. Stomach/Bowel: Normal appendix. Mild gaseous distention of the right colon and transverse colon with moderate stool burden. Stomach and small bowel decompressed. Vascular/Lymphatic: No evidence of aneurysm or adenopathy. Reproductive: Uterus and left adnexa unremarkable. 3 cm cyst in the right ovary. Other: No free fluid or free air. Musculoskeletal: No acute bony abnormality. IMPRESSION: No adenopathy in the chest, abdomen or pelvis. No acute cardiopulmonary disease. Splenomegaly. 3 cm cyst in the right ovary, likely functional cyst or dominant follicle. 1.6 cm left thyroid nodule. Recommend thyroid US (ref: J Am Coll Radiol. 2015 Feb;12(2): 143-50). Electronically Signed   By: Charlett NoseKevin  Dover M.D.   On: 06/04/2021 17:08  ? ?US THYROID ? ?Result Date: 06/06/2021 ?CLINICAL DATA:  Nodule noted on CT chest EXAM: THYROID ULTRASOUND TECHNIQUE: Ultrasound examination of the thyroid gland and adjacent soft tissues was performed. COMPARISON:  CT 06/04/2021 FINDINGS: Parenchymal Echotexture: Moderately heterogenous Isthmus: 0.5 cm thickness Right lobe: 4.1 x 2.4 x 1.2 cm Left lobe: 5.2 x 2.1 x 2.8 cm _________________________________________________________ Estimated total number of nodules >/= 1 cm: 0 Number of spongiform nodules >/=  2 cm not described below (TR1): 0 Number of mixed cystic and solid nodules >/= 1.5 cm not described below (TR2): 0 _________________________________________________________ Nodule # 1: Location: Left;  mid Maximum size: 2.4 cm; Other 2 dimensions: 1.8 x 2.3 cm Composition: mixed cystic and solid (1) Echogenicity: hypoechoic (2) Shape: not taller-than-wide (0) Margins: smooth (0) Echogenic foci: none (0) ACR TI-RADS total points: 3. ACR TI-RADS risk category: TR 3. ACR TI-RADS recommendations: *Given size (>/= 1.5 - 2.4 cm) and appearance, a follow-up ultrasound in 1 year should be considered based on TI-RADS criteria. _________________________________________________________ No regional cervical adenopathy identified. IMPRESSION: 1. Borderline thyromegaly with solitary 2.4 cm TR3 left nodule. Recommend annual/biennial ultrasound follow-up as above, until stability x5 years confirmed. The above is in keeping with the ACR TI-RADS recommendations - J Am Coll Radiol 2017;14:587-595. Electronically Signed   By: Corlis Leak  Hassell M.D.   On: 06/06/2021 09:24   ? ? ?Assessment/Plan: ?Neutropenia = afebrile, suspect viral cause. EBV negative. Awaiting CMV. Will check bartonella serology. Will continue with cefepime and discontinue linezolid. Continue to check cbc with diff. We can check for tickborne serology,  but suspect that presentation would be different ? ?Aphous ulcer = continue with magic mouthwash, increase to Q 4 hr. PRN ? ?Judyann Munsonynthia Emely Fahy ?[facility] ?Pager: [phone] ? ?06/06/2021, 2:57 PM ? ? ? ? ? ?

## 2021-06-06 NOTE — Progress Notes (Signed)
?  Transition of Care (TOC) Screening Note ? ? ?Patient Details  ?Name: Diane Wells ?Date of Birth: November 27, 1980 ? ? ?Transition of Care (TOC) CM/SW Contact:    ?Derico Mitton, LCSW ?Phone Number: ?06/06/2021, 9:12 AM ? ? ? ?Transition of Care Department [facility]) has reviewed patient and no TOC needs have been identified at this time. We will continue to monitor patient advancement through interdisciplinary progression rounds. If new patient transition needs arise, please place a TOC consult. ? ? ? ?

## 2021-06-06 NOTE — Progress Notes (Signed)
Date and time results received: 06/06/21 at 0421 ? ?Test: ANC (absolute neutrophil count)  ?Critical Value: 0.1 ? ?Name of Provider Notified: result same as yesterday, provider already aware and patient already receiving Granix injections daily. ?On-call provider (x.Blount, NP) notified via secure chat ? ?Orders Received? Or Actions Taken?: no new orders at this time. ?

## 2021-06-06 NOTE — Progress Notes (Signed)
?Progress Note ? ? ?Patient: Diane Wells [id number]:[id number] DOB: 1980-04-22 DOA: 06/03/2021     2 ?DOS: the patient was seen and examined on 06/06/2021 ?  ?Brief hospital course: ?41 year old with past medical history significant for asthma, granulomatosis with polyangiitis, arthritis, iatrogenic Cushing's, hypertension, CKD presents with persistence fever, fatigue and abnormal labs.  5 days ago she developed fevers, sinus pressure, she went to urgent care and was a started on Augmentin.  She also subsequently developed oral lesions, and decreased oral intake.  Saw her PCP and lab work revealed leukopenia ANC 0.0 she was referred for admission. ? ?She has not been taking her CellCept and Paxil due to her acute illness. ? ?Patient was found to be tachycardic, febrile, creatinine 1.2, white blood cell 1.5 ANC 0.0.  ESR 25, Monospot test negative.  Respiratory panel and COVID-negative.  Chest x-ray no acute abnormality. ? ?Admitted with neutropenic fever. ID and oncology following. Started on broad spectrum antibiotics. Started on Granix.  ? ?Assessment and Plan: ?* Neutropenic fever ([place]) ?Patient presents with fever, tachycardia, neutropenia white blood cell 1.5 ANC 0.0. Lymphadenopathy/ oral ulcers.  ?Blood cultures: No growth to date.  ?Chest x-ray negative for acute infection. ?Continue with Cefepime and Linezolid. Had itching with vancomycin.  ?-She had 2-3 small oral ulcers white plaque. Started on magic mouth wash, fluconazole.  ?-ID consulted.  ?-Appreciate Dr Burr Medico evaluation ?-CT abdomen-pelvis chest, no lymphadenopathy, splenomegaly, thyroid nodule, right ovary cyst.  ?-Started on Granix 3/11 ?-ANC; 0.1 ?-In regards  Felty syndrome, Discussed with Dr Trudie Reed, neutropenia could be related to infection, autoimmune process, medications. Felty syndrome maybe, patient has not required significant medications management for her RA, her treatment has been to target her vasculitis. She agrees with hematologist  evaluation. ?-Patient is asking about been tested for ticks born disease, lyme. She works at [employer]. Dr Baxter Flattery with ID will evaluate further.  ? ?Left thyroid nodule ?Thyroid US; Borderline thyromegaly, solitary 2.4 cm TR3 left nodule.  Recommend annual/biennial US>  ?She will follow up with Dr Buddy Duty, endocrinologist.  ? ?Cyst of ovary, right ?Per CT likely functional.  ?Follow out patient.  ? ?Secondary adrenal insufficiency ([place]) ?Continue with Hydrocortisone 10 mg in am and 5 mg afternoon.  ?If she develops hypotension or worsening vomiting, she will need stress dose steroids.  ? ?Rheumatoid arteritis ([place]) ?Hold cellcept and plaquenil./  ? ?Essential (primary) hypertension ?Holding  Cozaar in setting of infection.  ?If BP remain elevated will resume Cozaar.  ? ?SIRS (systemic inflammatory response syndrome) (HCC) ?Presents with fever, neutropenia, tachycardia.  ?Continue with broad spectrum antibiotics.  ?Blood culture: no growth to date. , C diff negative, GI pathogen negative. Urine culture no growth.  ?CT chest, abdomen, negative for infection.  ? ?Granulomatosis with polyangiitis with pulmonary involvement ([place]) ?Continue to hold Plaquenil and Cellcept due to neutropenic fever.  ?Follows with Dr Trudie Reed rheumatology/.   ?Discussed with Dr Trudie Reed, ok to hold Cellcept and plaquenil.  ? ?Moderate persistent asthma without complication ?Continue with Dulera and PRN albuterol.  ? ? ? ? ?  ? ?Subjective:  ?She report headaches today. She has ho headaches, but is now more frequent here. She has not been able to sleep well.  ?Neck lymphadenopathy feels a little smaller.  ? ? ?Physical Exam: ?Vitals:  ? 06/05/21 0510 06/05/21 2101 06/05/21 2117 06/06/21 0439  ?BP: 117/70  138/86 (!) 148/94  ?Pulse: 86  100 100  ?Resp: _0 ?Temp: 98.7 ?F (37.1 ?C)  98.9 ?F (37.2 ?C) 98.6 ?F (37 ?C)  ?TempSrc: Oral  Oral Oral  ?SpO2: 98% 97% 99% 97%  ?Weight:      ?Height:      ? ? ?Data Reviewed: ? ?CBC and c-met, cultures:  blood culture, urine culture.   ? ?Family Communication: Care discussed with patient.  ? ?Disposition: ?Status is: Inpatient ?Remains inpatient appropriate because: Still neutropenic, requiring IV antibiotics, granix.  ? Planned Discharge Destination: Home ? ? ? ?Time spent: 45 minutes ? ?Author: ?Elmarie Shiley, MD ?06/06/2021 2:09 PM ? ?For on call review [email or web address].  ?

## 2021-06-06 NOTE — Progress Notes (Signed)
Oncology Discharge Planning Admission Note ? ?[facility] ?Address: [address] [place], [place], [place] [address] ?Hours of Operation:  8am - 5pm, Monday - Friday  ?Clinic Contact Information:  [phone]) [phone] ? ?Oncology Care Team: ?Medical Oncologist:  Dr. Malachy Mood ? ?Dr. Mosetta Putt  is aware of this hospital admission dated 06/04/21 and has assessed patient at the bedside. The cancer center will follow Diane Wells?s inpatient care to assist with discharge planning as indicated by the oncologist.  We will arrange for necessary follow up closer to discharge. ? ? ?Disclaimer:  This Cancer Center nursing note does not imply a formal consult request has been made by the admitting attending for this admission or there will be an inpatient consult completed by oncology.  Please request oncology consults as per standard process as indicated. ?

## 2021-06-07 DIAGNOSIS — D709 Neutropenia, unspecified: Secondary | ICD-10-CM | POA: Diagnosis not present

## 2021-06-07 DIAGNOSIS — R5081 Fever presenting with conditions classified elsewhere: Secondary | ICD-10-CM | POA: Diagnosis not present

## 2021-06-07 LAB — CBC WITH DIFFERENTIAL/PLATELET
Abs Immature Granulocytes: 0.03 10*3/uL (ref 0.00–0.07)
Basophils Absolute: 0 10*3/uL (ref 0.0–0.1)
Basophils Relative: 1 %
Eosinophils Absolute: 0.1 10*3/uL (ref 0.0–0.5)
Eosinophils Relative: 3 %
HCT: 34.9 % — ABNORMAL LOW (ref 36.0–46.0)
Hemoglobin: 11.2 g/dL — ABNORMAL LOW (ref 12.0–15.0)
Immature Granulocytes: 2 %
Lymphocytes Relative: 59 %
Lymphs Abs: 1.1 10*3/uL (ref 0.7–4.0)
MCH: 27.1 pg (ref 26.0–34.0)
MCHC: 32.1 g/dL (ref 30.0–36.0)
MCV: 84.3 fL (ref 80.0–100.0)
Monocytes Absolute: 0.5 10*3/uL (ref 0.1–1.0)
Monocytes Relative: 29 %
Neutro Abs: 0.1 10*3/uL — CL (ref 1.7–7.7)
Neutrophils Relative %: 6 %
Platelets: 219 10*3/uL (ref 150–400)
RBC: 4.14 MIL/uL (ref 3.87–5.11)
RDW: 13.4 % (ref 11.5–15.5)
WBC: 1.8 10*3/uL — ABNORMAL LOW (ref 4.0–10.5)
nRBC: 1.1 % — ABNORMAL HIGH (ref 0.0–0.2)

## 2021-06-07 LAB — BARTONELLA ANTIBODY PANEL
B Quintana IgM: NEGATIVE titer
B henselae IgG: NEGATIVE titer
B henselae IgM: NEGATIVE titer
B quintana IgG: NEGATIVE titer

## 2021-06-07 LAB — EPSTEIN-BARR VIRUS VCA ANTIBODY PANEL
EBV NA IgG: <18 U/mL
EBV VCA IgG: <18 U/mL
EBV VCA IgM: <36 U/mL

## 2021-06-07 LAB — PATHOLOGIST SMEAR REVIEW

## 2021-06-07 LAB — COPPER, SERUM: Copper: 183 ug/dL — ABNORMAL HIGH (ref 80–158)

## 2021-06-07 MED ORDER — BUTALBITAL-APAP-CAFFEINE 50-325-40 MG PO TABS
1.0000 | ORAL_TABLET | Freq: Four times a day (QID) | ORAL | Status: DC | PRN
  Administered 2021-06-07 – 2021-06-10 (×7): 1 via ORAL
  Filled 2021-06-07 (×8): qty 1

## 2021-06-07 MED ORDER — LOSARTAN POTASSIUM 50 MG PO TABS
75.0000 mg | ORAL_TABLET | Freq: Every day | ORAL | Status: DC
Start: 2021-06-07 — End: 2021-06-10
  Administered 2021-06-07 – 2021-06-10 (×4): 75 mg via ORAL
  Filled 2021-06-07 (×4): qty 1

## 2021-06-07 NOTE — Plan of Care (Cosign Needed)
Completed during shift assessment ?

## 2021-06-07 NOTE — Progress Notes (Signed)
?Progress Note ? ? ?Patient: Diane Wells [id number] DOB: 01-15-1981 DOA: 06/03/2021     3 ?DOS: the patient was seen and examined on 06/07/2021 ?  ?Brief hospital course: ?41 year old with past medical history significant for asthma, granulomatosis with polyangiitis, arthritis, iatrogenic Cushing's, hypertension, CKD presents with persistence fever, fatigue and abnormal labs.  5 days ago she developed fevers, sinus pressure, she went to urgent care and was a started on Augmentin.  She also subsequently developed oral lesions, and decreased oral intake.  Saw her PCP and lab work revealed leukopenia ANC 0.0 she was referred for admission. ? ?She has not been taking her CellCept and Paxil due to her acute illness. ? ?Patient was found to be tachycardic, febrile, creatinine 1.2, white blood cell 1.5 ANC 0.0.  ESR 25, Monospot test negative.  Respiratory panel and COVID-negative.  Chest x-ray no acute abnormality. ? ?Admitted with neutropenic fever. ID and oncology following. Started on broad spectrum antibiotics. Started on Granix.  ? ?Bartonella IGG and IgM negative. RMSF pending. Viral load for CMV ordered.  ? ?Assessment and Plan: ?* Neutropenic fever ([place]) ?Patient presents with fever, tachycardia, neutropenia white blood cell 1.5 ANC 0.0. Lymphadenopathy/ oral ulcers.  ?Blood cultures: No growth to date.  ?Chest x-ray negative for acute infection. ?Treated initially with Linezolid and Cefepime, now only on Cefepime  ?-She had 2-3 small oral ulcers white plaque. Started on magic mouth wash, fluconazole.  ?-ID consulted. appreciate assistance.  ?-Appreciate Dr Burr Medico evaluation ?-CT abdomen-pelvis chest, no lymphadenopathy, splenomegaly, thyroid nodule, right ovary cyst.  ?-Started on Granix 3/11 ?-ANC; remain at 0.1 ?-In regards  Felty syndrome, Discussed with Dr Trudie Reed, neutropenia could be related to infection, autoimmune process, medications. Felty syndrome maybe, patient has not required significant  medications management for her RA, her treatment has been to target her vasculitis. She agrees with hematologist evaluation. ?-Bartonella antibody negative, RMSF pending. CMV IgM negative, IgG at 2.7. plan to check CMV viral load.  ?-follow recommendation from Dr Burr Medico in regards when can patient be discharge.  ? ?Left thyroid nodule ?Thyroid US; Borderline thyromegaly, solitary 2.4 cm TR3 left nodule.  Recommend annual/biennial US>  ?She will follow up with Dr Buddy Duty, endocrinologist.  ? ?Cyst of ovary, right ?Per CT likely functional.  ?Follow out patient.  ? ?Secondary adrenal insufficiency ([place]) ?Continue with Hydrocortisone 10 mg in am and 5 mg afternoon.  ?She takes 5 mh hydrocortisone at home. Dose was increase due to acute illness.  ?I discussed with Dr. Buddy Duty,  ok to use hydrocortisone 10 mg in am and 5 mg afternoon. Taper down as needed, depending on clinical stability. If she is discharge home soon, ok to discharge on 10 mg daily for 2 -3 days after discharge. Follow up with him after discharge.  ?-Consider transition to hydrocortisone 10 mg daily from 3/15 if stable.  ? ?Rheumatoid arteritis ([place]) ?Hold cellcept and plaquenil./  ? ?Essential (primary) hypertension ?Holding  Cozaar in setting of infection.  ?Resume Cozaar today.  ? ?SIRS (systemic inflammatory response syndrome) (HCC) ?Presents with fever, neutropenia, tachycardia.  ?Continue with broad spectrum antibiotics.  ?Blood culture: no growth to date. , C diff negative, GI pathogen negative. Urine culture no growth.  ?CT chest, abdomen, negative for infection.  ? ?Granulomatosis with polyangiitis with pulmonary involvement ([place]) ?Continue to hold Plaquenil and Cellcept due to neutropenic fever.  ?Follows with Dr Trudie Reed rheumatology/.   ?Discussed with Dr Trudie Reed, ok to hold Cellcept and plaquenil.  ? ?Moderate persistent asthma without  complication ?Continue with Dulera and PRN albuterol.  ? ? ?Headaches; Resume home Fioricept/  ? ? ?  ? ?Subjective:   ?She is complaining of bad headaches. No nuchal rigidity/ , temporal and occipital area.  ? ? ? ?Physical Exam: ?Vitals:  ? 06/06/21 1936 06/06/21 2058 06/07/21 0548 06/07/21 1353  ?BP:  140/87 (!) 143/95 136/87  ?Pulse:  96 97 95  ?Resp:  '16 16 18  ' ?Temp:  98.9 ?F (37.2 ?C) 98.6 ?F (37 ?C) 98.6 ?F (37 ?C)  ?TempSrc:  Oral Oral Oral  ?SpO2: 97% 98% 94% 97%  ?Weight:      ?Height:      ? ?General; NAD, no nuchal rigidity.  ?Lung; CTA ?Abdomen; soft, nt, nd ?Extremities no edema.  ? ?Data Reviewed: ? ?Cbc and Bmet , CMV results reviewed.  ? ?Family Communication: Care  discussed with patient.  ? ?Disposition: ?Status is: Inpatient ?Remains inpatient appropriate because: admitted for neutropenic fever.  ? Planned Discharge Destination: Home ? ? ? ?Time spent: 45 minutes ? ?Author: ?Elmarie Shiley, MD ?06/07/2021 2:35 PM ? ?For on call review [email or web address].  ?

## 2021-06-07 NOTE — Progress Notes (Signed)
Pharmacy Antibiotic Note ? ?Diane Wells is a 41 y.o. female admitted on 06/03/2021 with past medical history of granulomatosis with polyangiitis as well as rheumatoid arthritis on CellCept and Plaquenil presenting to [facility] emergency department with complaints of fevers and malaise.  Pharmacy has been consulted to dose cefepime and vancomycin for febrile neutropenia. Vanc was changed to zyvox due to possible vanc allergy and then zyvox was d/c'd, cefepime continues along with Diflucan ? ?Day 4 Cefepime  ?Day 4 Diflucan ?WBC 1.8, ANC 0.1 - both essentially unchanged ?Afebrile ?All cultures negative to date ? ? ?Plan: ?Continue Cefepime 2g IV q8h ?Diflucan per ID ?Follow up renal function, culture results, and clinical course. ? ? ?Height: 5\' 7"  (170.2 cm) ?Weight: 133.8 kg (295 lb) ?IBW/kg (Calculated) : 61.6 ? ?Temp (24hrs), Avg:98.6 ?F (37 ?C), Min:98.3 ?F (36.8 ?C), Max:98.9 ?F (37.2 ?C) ? ?Recent Labs  ?Lab 06/03/21 ?1104 06/03/21 ?1750 06/03/21 ?2156 06/03/21 ?2351 06/04/21 ?[id number] 06/05/21 ?[id number] 06/06/21 ?[id number] 06/07/21 ?[id number]  ?WBC 1.8 Repeated and verified X2.* 1.5*  --   --  1.9* 1.8* 1.9* 1.8*  ?CREATININE 1.20 1.29*  --   --  1.05* 0.87 0.88  --   ?LATICACIDVEN  --   --  1.3 0.7  --   --   --   --   ? ?  ?Estimated Creatinine Clearance: 121.4 mL/min (by C-G formula based on SCr of 0.88 mg/dL).   ? ?Allergies  ?Allergen Reactions  ? Albuterol Shortness Of Breath and Other (See Comments)  ?  Tolerates HFA inhaler, nebulizer treatment caused O2 drop  ? Other Anaphylaxis  ?  Essential Oil, scent unknown, caused tongue swelling  ? Truxima [Rituximab] Anaphylaxis  ? Sulfa Antibiotics Hives  ? Vancomycin Itching  ?  Scalp itching - felt like it was on fire  ? ?Antimicrobials this admission:  ?3/10 Cefepime >>  ?3/11 Vancomycin >> 3/11 (dev itching to vanc) ?3/12 Linezolid >> 3/13 ?3/11 Diflucan >>  ? ?Dose adjustments this admission:  ? ?Microbiology results:  ?3/10 Covid neg; influenza neg ?3/10  [place] screen: neg ?3/11 BCx: ngtd ?3/11 UCx:ngf ?3/11 GI panel: neg ?3/11 Cdiff: neg ?3/11 CMV antibodies: not detected ?3/13 tickborne serology: ordered per ID ? ?Thank you for allowing pharmacy to be a part of this patient?s care. ? ?Adrian Saran, PharmD, BCPS ?Secure Chat if ?s ?06/07/2021 7:25 AM ? ? ? ?

## 2021-06-07 NOTE — Progress Notes (Signed)
?  [facility]   ? ?Date of Admission:  06/03/2021   Total days of antibiotics 5/cefepime ?        ? ?ID: Diane Wells is a 41 y.o. female with new onset neutropenia ?Principal Problem: ?  Neutropenic fever (HCC) ?Active Problems: ?  Moderate persistent asthma without complication ?  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  SIRS (systemic inflammatory response syndrome) (HCC) ?  Essential (primary) hypertension ?  Rheumatoid arteritis (HCC) ?  Secondary adrenal insufficiency (HCC) ?  Cyst of ovary, right ?  Left thyroid nodule ? ? ? ?Subjective: ?Afebrile.  ? ?Medications:  ? enoxaparin (LOVENOX) injection  40 mg Subcutaneous Q24H  ? feeding supplement  237 mL Oral BID BM  ? hydrocortisone  10 mg Oral Daily  ? hydrocortisone  5 mg Oral q1800  ? losartan  75 mg Oral Daily  ? magic mouthwash w/lidocaine  5 mL Oral Q4H  ? mometasone-formoterol  2 puff Inhalation BID  ? Tbo-filgastrim (GRANIX) SQ  480 mcg Subcutaneous q1800  ? ? ?Objective: ?Vital signs in last 24 hours: ?Temp:  [98.6 ?F (37 ?C)-98.9 ?F (37.2 ?C)] 98.6 ?F (37 ?C) (03/14 1353) ?Pulse Rate:  [95-97] 95 (03/14 1353) ?Resp:  [16-18] 18 (03/14 1353) ?BP: (136-143)/(87-95) 136/87 (03/14 1353) ?SpO2:  [94 %-98 %] 97 % (03/14 1353) ?Physical Exam  ?Constitutional:  oriented to person, place, and time. appears well-developed and well-nourished. No distress.  ?HENT: Oval/AT, PERRLA, no scleral icterus; aphous ulcer still present to buccal mucosa and right side of tongue., injected gumline ?Mouth/Throat: Oropharynx is clear and moist. No oropharyngeal exudate.  ?Cardiovascular: Normal rate, regular rhythm and normal heart sounds. Exam reveals no gallop and no friction rub.  ?No murmur heard.  ?Pulmonary/Chest: Effort normal and breath sounds normal. No respiratory distress.  has no wheezes.  ?Neck = supple, no nuchal rigidity ?Abdominal: Soft. Bowel sounds are normal.  exhibits no distension. There is no  tenderness.  ?Lymphadenopathy: no cervical adenopathy. No axillary adenopathy ?Neurological: alert and oriented to person, place, and time.  ?Skin: Skin is warm and dry. No rash noted. No erythema.  ?Psychiatric: a normal mood and affect.  behavior is normal.  ? ? ?Lab Results ?Recent Labs  ?  06/05/21 ?0326 06/06/21 ?0250 06/07/21 ?0238  ?WBC 1.8* 1.9* 1.8*  ?HGB 11.7* 11.0* 11.2*  ?HCT 36.4 35.0* 34.9*  ?NA 133* 133*  --   ?K 3.4* 3.8  --   ?CL 104 104  --   ?CO2 21* 22  --   ?BUN 10 7  --   ?CREATININE 0.87 0.88  --   ? ?Liver Panel ?Recent Labs  ?  06/06/21 ?0250  ?PROT 5.8*  ?ALBUMIN 2.8*  ?AST 12*  ?ALT 15  ?ALKPHOS 35*  ?BILITOT 0.1*  ? ?Sedimentation Rate ?No results for input(s): ESRSEDRATE in the last 72 hours. ?C-Reactive Protein ?No results for input(s): CRP in the last 72 hours. ? ?Microbiology: ?3/11 blood culture -- NGTD ?3/11 urine cx ?3/13 bartonella NEGATIVE ?CMV Ig M -/ IG G + ?EBV NEGATIVE ?Studies/Results: ?No results found. ? ? ?Assessment/Plan: ?New onset neutropenia/febrile neutropenia = remains to have ANC 100 despite 4 days of GCSF. Hem/onc plans to continue daily until ANC 1500. For the time being continue cefepime. Not sure if flow cytometry would be helpful, repeat evaluation of PBS for atypical cells. Defer to hem/onc for next steps in work up.  ?Infectious work up thus far -shows CMV+  IG G,likely previous infection. Will check CMV VL ? ?Judyann Munsonynthia Lason [place] ?[facility] ?Pager: [phone] ? ?06/07/2021, 5:07 PM ? ? ? ? ? ?

## 2021-06-08 DIAGNOSIS — R5081 Fever presenting with conditions classified elsewhere: Secondary | ICD-10-CM | POA: Diagnosis not present

## 2021-06-08 DIAGNOSIS — D709 Neutropenia, unspecified: Secondary | ICD-10-CM | POA: Diagnosis not present

## 2021-06-08 LAB — CBC WITH DIFFERENTIAL/PLATELET
Abs Immature Granulocytes: 0 10*3/uL (ref 0.00–0.07)
Basophils Absolute: 0 10*3/uL (ref 0.0–0.1)
Basophils Relative: 0 %
Eosinophils Absolute: 0.1 10*3/uL (ref 0.0–0.5)
Eosinophils Relative: 5 %
HCT: 37.4 % (ref 36.0–46.0)
Hemoglobin: 12.1 g/dL (ref 12.0–15.0)
Immature Granulocytes: 0 %
Lymphocytes Relative: 56 %
Lymphs Abs: 1 10*3/uL (ref 0.7–4.0)
MCH: 27.7 pg (ref 26.0–34.0)
MCHC: 32.4 g/dL (ref 30.0–36.0)
MCV: 85.6 fL (ref 80.0–100.0)
Monocytes Absolute: 0.6 10*3/uL (ref 0.1–1.0)
Monocytes Relative: 33 %
Neutro Abs: 0.1 10*3/uL — CL (ref 1.7–7.7)
Neutrophils Relative %: 6 %
Platelets: 176 10*3/uL (ref 150–400)
RBC: 4.37 MIL/uL (ref 3.87–5.11)
RDW: 13.3 % (ref 11.5–15.5)
WBC: 1.7 10*3/uL — ABNORMAL LOW (ref 4.0–10.5)
nRBC: 1.1 % — ABNORMAL HIGH (ref 0.0–0.2)

## 2021-06-08 LAB — ROCKY MTN SPOTTED FVR ABS PNL(IGG+IGM)
RMSF IgG: NEGATIVE
RMSF IgM: 0.68 index (ref 0.00–0.89)

## 2021-06-08 MED ORDER — GABAPENTIN 300 MG PO CAPS
300.0000 mg | ORAL_CAPSULE | Freq: Two times a day (BID) | ORAL | Status: DC
  Administered 2021-06-08 – 2021-06-10 (×4): 300 mg via ORAL
  Filled 2021-06-08 (×4): qty 1

## 2021-06-08 MED ORDER — METHOCARBAMOL 500 MG PO TABS
750.0000 mg | ORAL_TABLET | Freq: Three times a day (TID) | ORAL | Status: DC | PRN
  Administered 2021-06-09 – 2021-06-10 (×3): 750 mg via ORAL
  Filled 2021-06-08 (×3): qty 2

## 2021-06-08 MED ORDER — SODIUM CHLORIDE 0.9 % IV SOLN
3.0000 g | Freq: Four times a day (QID) | INTRAVENOUS | Status: DC
  Administered 2021-06-08 – 2021-06-10 (×8): 3 g via INTRAVENOUS
  Filled 2021-06-08 (×9): qty 8

## 2021-06-08 MED ORDER — CHLORHEXIDINE GLUCONATE 0.12 % MT SOLN
15.0000 mL | Freq: Two times a day (BID) | OROMUCOSAL | Status: DC
  Administered 2021-06-08 – 2021-06-10 (×4): 15 mL via OROMUCOSAL
  Filled 2021-06-08 (×4): qty 15

## 2021-06-08 NOTE — Progress Notes (Signed)
?PROGRESS NOTE ? ? ? ?TYLER ROBIDOUX  OHY:[id number] DOB: 22-Jul-1980 DOA: 06/03/2021 ?PCP: Darreld Mclean, MD  ? ? ? ?Brief Narrative:  ? ?41 year old with past medical history significant for asthma, RA, granulomatosis with polyangiitis, arthritis, iatrogenic Cushing's, hypertension, CKD presents with persistence fever, fatigue and abnormal labs.   ? ?5 days ago she developed fevers, sinus pressure, she went to urgent care and was a started on Augmentin.  She also subsequently developed oral lesions, and decreased oral intake.  Saw her PCP and lab work revealed leukopenia ANC 0.0 she was referred for admission. ? ?She has not been taking her CellCept and Paxil due to her acute illness. ? ?Patient was found to be tachycardic, febrile, creatinine 1.2, white blood cell 1.5 ANC 0.0.  ESR 25, Monospot test negative.  Respiratory panel and COVID-negative.  Chest x-ray no acute abnormality. ? ?Admitted with neutropenic fever. ID and oncology following. Started on broad spectrum antibiotics. Started on Granix.  ? ? ? ?Subjective: ? ?T max 99.3 last 24hrs ?Mild sinus tachycardia ?Bp stable ? ?Diarrhea started after augmentin has improved, no ab pain, no n/v ?Mouth sore remains the same, think start to have teeth issues/abscesses as well ?Right neck lym a little better ?No cough ? ?Reports low back pain which is chronic  ? ?Assessment & Plan: ? Principal Problem: ?  Neutropenic fever ([place]) ?Active Problems: ?  Moderate persistent asthma without complication ?  Granulomatosis with polyangiitis with pulmonary involvement ([place]) ?  SIRS (systemic inflammatory response syndrome) (HCC) ?  Essential (primary) hypertension ?  Rheumatoid arteritis ([place]) ?  Secondary adrenal insufficiency (HCC) ?  Cyst of ovary, right ?  Left thyroid nodule ? ? ? ?Assessment and Plan: ? ? ?* Neutropenic fever (HCC)/SIRS ?-Patient presents with fever, tachycardia, neutropenia white blood cell 1.5 ANC 0.0. Lymphadenopathy/ oral ulcers.   ? ?-Bartonella IGG and IgM negative.  ?-RMSF negative.  ?-Cmv IgM negative, cmv IgG +, Viral load for CMV DNA quantitative in process ?-Cdiff negative, gi pcr panel negative ?-Urine culture negative ?-Blood culture no growth ?-Chest x-ray negative for acute infection. Ct ab/pel no acute infectious process identified  ?-covid screening negative, flu negative ?-She had 2-3 small oral ulcers white plaque. Started on magic mouth wash, fluconazole. ?-Treated initially with Linezolid and Cefepime, now only on Cefepime and iv diflucan ?- -ID consulted. appreciate assistance. Will discuss with ID regarding CT maxillofacial/CT soft tissue neck versus Panorex ? ?Neutropenia: right cervical lymphadenopathy ?-Appreciate oncology Dr Burr Medico evaluation ?-CT abdomen-pelvis chest, no lymphadenopathy, splenomegaly, thyroid nodule, right ovary cyst.  ?-Started on Granix 3/11 ?-ANC; remain at 0.1 ?-In regards  Felty syndrome, Discussed with Dr Trudie Reed, neutropenia could be related to infection, autoimmune process, medications. Felty syndrome maybe, patient has not required significant medications management for her RA, her treatment has been to target her vasculitis. She agrees with hematologist evaluation. ?-plan for bone marrow biopsy, then outpatient oncology follow up ? ? ? ?Essential (primary) hypertension ? Cozaar held initially in setting of infection ?Resumed on 3/14 ? ?Secondary adrenal insufficiency (HCC) ?Continue with Hydrocortisone 10 mg in am and 5 mg afternoon.  ?She takes 5 mh hydrocortisone at home. Dose was increase due to acute illness.  ?I discussed with Dr. Buddy Duty,  ok to use hydrocortisone 10 mg in am and 5 mg afternoon. Taper down as needed, depending on clinical stability. If she is discharge home soon, ok to discharge on 10 mg daily for 2 -3 days after discharge. Follow up with him  after discharge.  ?-Consider transition to hydrocortisone 10 mg daily from 3/15 if stable.  ? ?Rheumatoid arteritis ([place]) ?Hold  cellcept and plaquenil. ? ? ? ?Granulomatosis with polyangiitis with pulmonary involvement ([place]) ?Continue to hold Plaquenil and Cellcept due to neutropenic fever.  ?Follows with Dr Trudie Reed rheumatology/.   ?Discussed with Dr Trudie Reed, ok to hold Cellcept and plaquenil.  ? ?Moderate persistent asthma without complication ?Continue with Dulera and PRN albuterol.  ? ? ? ?Left thyroid nodule ?Thyroid US; Borderline thyromegaly, solitary 2.4 cm TR3 left nodule.  Recommend annual/biennial US>  ?She will follow up with Dr Buddy Duty, endocrinologist.  ? ?Cyst of ovary, right ?Per CT likely functional.  ?Follow out patient.  ? ? ? Body mass index is 46.2 kg/m?Marland Kitchen.  Meet criteria for class III obesity ?. ? ? ?I have Reviewed nursing notes, Vitals, pain scores, I/o's, Lab results and  imaging results since pt's last encounter, details please see discussion above Case discussed with infectious disease Dr. Graylon Good , oncology Dr. Burr Medico  and IR PA Mr Allred  ?I ordered the following labs:  ?Unresulted Labs (From admission, onward)  ? ?  Start     Ordered  ? 06/09/21 1694  Basic metabolic panel  Tomorrow morning,   R       ?Question:  Specimen collection method  Answer:  IV Team=IV Team collect  ? 06/08/21 1508  ? 06/09/21 0500  Magnesium  Tomorrow morning,   STAT       ?Question:  Specimen collection method  Answer:  IV Team=IV Team collect  ? 06/08/21 1508  ? 06/07/21 1136  CMV DNA, quantitative, PCR  Once,   R       ?Question:  Specimen collection method  Answer:  IV Team=IV Team collect  ? 06/07/21 1135  ? 06/06/21 0500  CBC with Differential/Platelet  Daily,   R     ? 06/05/21 0717  ? ?  ?  ? ?  ? ? ? ?DVT prophylaxis: enoxaparin (LOVENOX) injection 40 mg Start: 06/05/21 1000 ? ? ?Code Status:   Code Status: Not on file ? ?Family Communication: Patient ?Disposition:  ? ?Status is: Inpatient ? ? Dispo: The patient is from: home ?             Anticipated d/c is to: home on Friday or Saturday if she is stable after bone marrow biopsy ?              ?Antimicrobials:   ?Anti-infectives (From admission, onward)  ? ? Start     Dose/Rate Route Frequency Ordered Stop  ? 06/05/21 1000  linezolid (ZYVOX) IVPB 600 mg  Status:  Discontinued       ? 600 mg ?300 mL/hr over 60 Minutes Intravenous Every 12 hours 06/04/21 0854 06/06/21 1434  ? 06/05/21 0900  vancomycin (VANCOREADY) IVPB 1750 mg/350 mL  Status:  Discontinued       ?See Hyperspace for full Linked Orders Report.  ? 1,750 mg ?175 mL/hr over 120 Minutes Intravenous Every 24 hours 06/04/21 0716 06/04/21 0853  ? 06/04/21 1645  fluconazole (DIFLUCAN) IVPB 200 mg       ? 200 mg ?100 mL/hr over 60 Minutes Intravenous Every 24 hours 06/04/21 1558    ? 06/04/21 1000  hydroxychloroquine (PLAQUENIL) tablet 200 mg  Status:  Discontinued       ? 200 mg Oral 2 times daily 06/04/21 0008 06/04/21 0034  ? 06/04/21 0815  vancomycin (VANCOREADY) IVPB 2000 mg/400 mL  Status:  Discontinued       ?See Hyperspace for full Linked Orders Report.  ? 2,000 mg ?200 mL/hr over 120 Minutes Intravenous  Once 06/04/21 0716 06/04/21 0853  ? 06/04/21 0400  ceFEPIme (MAXIPIME) 2 g in sodium chloride 0.9 % 100 mL IVPB  Status:  Discontinued       ? 2 g ?200 mL/hr over 30 Minutes Intravenous Every 8 hours 06/04/21 0016 06/04/21 0034  ? 06/04/21 0400  ceFEPIme (MAXIPIME) 2 g in sodium chloride 0.9 % 100 mL IVPB       ? 2 g ?200 mL/hr over 30 Minutes Intravenous Every 8 hours 06/04/21 0034    ? 06/03/21 1930  ceFEPIme (MAXIPIME) 2 g in sodium chloride 0.9 % 100 mL IVPB       ? 2 g ?200 mL/hr over 30 Minutes Intravenous  Once 06/03/21 1929 06/03/21 2037  ? ?  ? ? ? ? ? ? ?Objective: ?Vitals:  ? 06/08/21 0445 06/08/21 0840 06/08/21 1400 06/08/21 1402  ?BP: (!) 140/93  137/88   ?Pulse: (!) 110  (!) 108 (!) 106  ?Resp: '20  11 16  ' ?Temp: 99.1 ?F (37.3 ?C)  99.3 ?F (37.4 ?C)   ?TempSrc: Oral  Oral   ?SpO2: 93% 95% 94%   ?Weight:      ?Height:      ? ? ?Intake/Output Summary (Last 24 hours) at 06/08/2021 1523 ?Last data filed at 06/08/2021 0907 ?Gross  per 24 hour  ?Intake 830.22 ml  ?Output --  ?Net 830.22 ml  ? ?Filed Weights  ? 06/03/21 1705  ?Weight: 133.8 kg  ? ? ?Examination: ? ?General exam: Appear weak ,alert, awake, communicative,calm, NAD ?Respiratory syste

## 2021-06-08 NOTE — Consult Note (Signed)
? ?Chief Complaint: ?Patient was seen in consultation today for CT guided bone marrow biopsy ?Chief Complaint  ?Patient presents with  ? Abnormal Lab  ? ? ?Referring Physician(s): ?Feng,Y ? ?Supervising Physician: Watts, John ? ?Patient Status: [facility] - In-pt ? ?History of Present Illness: ?Diane Wells is a 41 y.o. female with a history of rheumatoid arthritis, asthma, obesity, HTN, CKD, secondary adrenal insuff, granulomatosis with polyangiitis on CellCept and Plaquenil who was recently admitted to [facility] with fever, mucositis, cervical lymphadenopathy and new onset severe neutropenia.   She was tested for viral etiologies which were negative and given Augmentin for concern for sinusitis.  She also has oral lesions . 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  Request received for CT guided bone marrow biopsy for further evaluation.  ? ?Past Medical History:  ?Diagnosis Date  ? Allergic rhinitis   ? Asthma   ? Chronic kidney disease   ? Granulomatosis with polyangiitis (HCC)   ? Hypertension   ? Recurrent upper respiratory infection (URI)   ? ? ?Past Surgical History:  ?Procedure Laterality Date  ? ADENOIDECTOMY    ? ANTERIOR CERVICAL DECOMP/DISCECTOMY FUSION    ? ? ?Allergies: ?Albuterol, Other, Truxima [rituximab], Sulfa antibiotics, and Vancomycin ? ?Medications: ?Prior to Admission medications   ?Medication Sig Start Date End Date Taking? Authorizing Provider  ?amoxicillin-clavulanate (AUGMENTIN) 875-125 MG tablet Take 1 tablet by mouth 2 (two) times daily.   Yes [provider]  ?budesonide-formoterol (SYMBICORT) 160-4.5 MCG/ACT inhaler Inhale 2 puffs into the lungs in the morning and at bedtime. 05/05/21  Yes Desai, Nikita S, MD  ?butalbital-acetaminophen-caffeine (FIORICET) 50-325-40 MG tablet Take 1 tablet by mouth every 6 (six) hours as needed for headache. ?Patient taking differently: Take 1 tablet by mouth every 6 (six) hours as needed (severe tension  headache). 10/20/20  Yes Copland, Jessica C, MD  ?EPINEPHrine 0.3 mg/0.3 mL IJ SOAJ injection Inject 0.3 mg into the muscle once as needed for anaphylaxis.   Yes [provider]  ?fexofenadine (ALLEGRA) 180 MG tablet Take 180 mg by mouth every morning.   Yes [provider]  ?fluticasone (FLONASE) 50 MCG/ACT nasal spray Place 2 sprays into both nostrils in the morning and at bedtime. 01/01/20 06/04/21 Yes Desai, Nikita S, MD  ?furosemide (LASIX) 20 MG tablet TAKE 1 (ONE) TABLET DAILY AS NEEDED ?Patient taking differently: Take 20-40 mg by mouth daily as needed (swelling). 04/13/21  Yes Copland, Jessica C, MD  ?gabapentin (NEURONTIN) 300 MG capsule TAKE 1 CAPSULE BY MOUTH THREE TIMES A DAY ?Patient taking differently: Take 300 mg by mouth See admin instructions. Take one capsule (300 mg) by mouth twice daily, may also take one capsule (300 mg) midday as needed for pain 10/20/20  Yes Copland, Jessica C, MD  ?hydrocortisone (CORTEF) 5 MG tablet Take 5 mg by mouth See admin instructions. Take one tablet (5 mg) by mouth daily for adrenal insufficiency; double the dose (10 mg) during illness 04/04/21  Yes [provider]  ?hydroxychloroquine (PLAQUENIL) 200 MG tablet Take 200 mg by mouth 2 (two) times daily. 11/29/20  Yes [provider]  ?ketorolac (ACULAR) 0.5 % ophthalmic solution 1 drop See admin instructions. Apply one drop into left eye daily at bedtime, may also use up to three more times daily in affected eye(s) as needed for scleritis 11/11/20  Yes [provider]  ?losartan (COZAAR) 25 MG tablet TAKE 3 TABLETS BY MOUTH DAILY ?Patient taking differently: Take 75 mg by   mouth at bedtime. 05/03/21  Yes Copland, Jessica C, MD  ?magic mouthwash w/lidocaine SOLN Take 5 mLs by mouth 4 (four) times daily as needed for mouth pain. Swish and Spit 06/03/21  Yes Lowne Chase, Yvonne R, DO  ?methocarbamol (ROBAXIN) 750 MG tablet TAKE 1 TABLET (750 MG TOTAL) BY MOUTH EVERY 8 (EIGHT) HOURS AS  NEEDED FOR MUSCLE SPASMS. ?Patient taking differently: Take 750 mg by mouth See admin instructions. Take one tablet (750 mg) by mouth twice daily, may also take one tablet (750 mg) midday as needed for muscle spasms/pain 02/10/21  Yes Copland, Jessica C, MD  ?mycophenolate (CELLCEPT) 500 MG tablet Take 1,500 mg by mouth 2 (two) times daily. 12/12/19  Yes [provider]  ?ondansetron (ZOFRAN) 8 MG tablet Take 1 tablet (8 mg total) by mouth every 8 (eight) hours as needed for nausea or vomiting. 10/20/20  Yes Copland, Jessica C, MD  ?PROAIR HFA 108 (90 Base) MCG/ACT inhaler Inhale 1-2 puffs into the lungs daily as needed for shortness of breath. 06/11/19  Yes Desai, Nikita S, MD  ?Vitamin D3 (VITAMIN D) 25 MCG tablet Take 1,000 Units by mouth every morning.   Yes [provider]  ?hydrocortisone (CORTEF) 10 MG tablet 10 mg Am and 5 mg PM. ?Patient not taking: Reported on 06/04/2021 10/02/20   [provider]  ?predniSONE (DELTASONE) 10 MG tablet TAKE 3 TABLETS PO QD FOR 3 DAYS THEN TAKE 2 TABLETS PO QD FOR 3 DAYS THEN TAKE 1 TABLET PO QD FOR 3 DAYS THEN TAKE 1/2 TAB PO QD FOR 3 DAYS ?Patient not taking: Reported on 06/04/2021 06/03/21   Lowne Chase, Yvonne R, DO  ?  ? ?Family History  ?Problem Relation Age of Onset  ? Diabetes Mother   ?     Type 1 Diabetes, juvenile onset  ? Hypertension Mother   ? Pulmonary Hypertension Mother   ? Hyperthyroidism Mother   ? Glaucoma Mother   ? Neuropathy Mother   ? Sjogren's syndrome Mother   ? Carpal tunnel syndrome Mother   ? Hypertension Father   ? Gallbladder disease Father   ? Hypertension Maternal Grandmother   ? Glaucoma Maternal Grandmother   ? Coronary artery disease Maternal Grandmother   ? Heart attack Maternal Grandmother   ? Angina Maternal Grandmother   ? Macular degeneration Maternal Grandmother   ? COPD Maternal Grandmother   ? Congestive Heart Failure Maternal Grandmother   ? Glaucoma Maternal Grandfather   ? Leukemia Paternal Grandmother   ?  Hypertension Paternal Grandmother   ? Colon cancer Paternal Grandfather   ? Diabetes Maternal Uncle   ? Heart attack Maternal Uncle   ? Stroke Maternal Uncle   ? Allergic rhinitis Neg Hx   ? Angioedema Neg Hx   ? Asthma Neg Hx   ? Eczema Neg Hx   ? ? ?Social History  ? ?Socioeconomic History  ? Marital status: Single  ?  Spouse name: Not on file  ? Number of children: 0  ? Years of education: Not on file  ? Highest education level: Not on file  ?Occupational History  ? Not on file  ?Tobacco Use  ? Smoking status: Never  ? Smokeless tobacco: Never  ?Vaping Use  ? Vaping Use: Never used  ?Substance and Sexual Activity  ? Alcohol use: Yes  ?  Comment: Rare  ? Drug use: No  ? Sexual activity: Not Currently  ?  Birth control/protection: None  ?Other Topics Concern  ? Not   on file  ?Social History Narrative  ? Not on file  ? ?Social Determinants of Health  ? ?Financial Resource Strain: Not on file  ?Food Insecurity: Not on file  ?Transportation Needs: Not on file  ?Physical Activity: Not on file  ?Stress: Not on file  ?Social Connections: Not on file  ? ? ? ? ?Review of Systems denies high fever, chest pain, dyspnea, cough, abd pain,vomiting or bleeding; she does have occ HA, mouth soreness, occ back pain, fatigue ? ?Vital Signs: ?BP 137/88 (BP Location: Right Arm)   Pulse (!) 106   Temp 99.3 ?F (37.4 ?C) (Oral)   Resp 16   Ht 5' 7" (1.702 m)   Wt 295 lb (133.8 kg)   LMP 05/31/2021   SpO2 94%   BMI 46.20 kg/m?  ? ?Physical Exam awake/alert; chest- CTA bilat; heart- RRR; abd- soft,+BS,NT; no LE edema ? ?Imaging: ?DG Chest 2 View ? ?Result Date: 06/03/2021 ?CLINICAL DATA:  Fever EXAM: CHEST - 2 VIEW COMPARISON:  10/24/2019 FINDINGS: Heart and mediastinal contours are within normal limits. No focal opacities or effusions. No acute bony abnormality. IMPRESSION: No active cardiopulmonary disease. Electronically Signed   By: Rolm Baptise M.D.   On: 06/03/2021 19:12  ? ?CT CHEST ABDOMEN PELVIS W CONTRAST ? ?Result Date:  06/04/2021 ?CLINICAL DATA:  Lymphadenopathy, chest or axilla EXAM: CT CHEST, ABDOMEN, AND PELVIS WITH CONTRAST TECHNIQUE: Multidetector CT imaging of the chest, abdomen and pelvis was performed following the standard protoc

## 2021-06-08 NOTE — Progress Notes (Addendum)
Diane Wells   DOB:03/11/1981   ZO#:[id number]   JWJ#:[id number] ? ?Hematology follow-up ? ?Subjective: Patient is afebrile, with stable vital signs.  She has persistent mild sore, fatigue, and headaches.  No other new complaints. ? ? ?Objective:  ?Vitals:  ? 06/08/21 0445 06/08/21 0840  ?BP: (!) 140/93   ?Pulse: (!) 110   ?Resp: 20   ?Temp: 99.1 ?F (37.3 ?C)   ?SpO2: 93% 95%  ?  Body mass index is 46.2 kg/m?. ? ?Intake/Output Summary (Last 24 hours) at 06/08/2021 1316 ?Last data filed at 06/08/2021 0907 ?Gross per 24 hour  ?Intake 830.22 ml  ?Output --  ?Net 830.22 ml  ? ? ? Sclerae unicteric ? MSK no focal spinal tenderness, no peripheral edema ? Neuro nonfocal ?  ? ?CBG (last 3)  ?No results for input(s): GLUCAP in the last 72 hours. ? ? ?Labs:  ? ?Urine Studies ?No results for input(s): UHGB, CRYS in the last 72 hours. ? ?Invalid input(s): UACOL, UAPR, USPG, UPH, UTP, UGL, UKET, UBIL, UNIT, UROB, ULEU, UEPI, UWBC, URBC, UBAC, CAST, UCOM, BILUA ? ?Basic Metabolic Panel: ?Recent Labs  ?Lab 06/03/21 ?1104 06/03/21 ?1750 06/04/21 ?6213 06/05/21 ?0326 06/06/21 ?0250  ?NA 137 135 134* 133* 133*  ?K 3.7 4.2 3.8 3.4* 3.8  ?CL 100 101 104 104 104  ?CO2 21* 22  ?GLUCOSE 94 100* 100* 99 90  ?BUN ?CREATININE 1.20 1.29* 1.05* 0.87 0.88  ?CALCIUM 8.6 8.6* 7.9* 7.9* 7.7*  ? ?GFR ?Estimated Creatinine Clearance: 121.4 mL/min (by C-G formula based on SCr of 0.88 mg/dL). ?Liver Function Tests: ?Recent Labs  ?Lab 06/03/21 ?1104 06/03/21 ?1750 06/04/21 ?0808 06/06/21 ?0250  ?AST 11 14* 13* 12*  ?ALT ?ALKPHOS 42 45 36* 35*  ?BILITOT 0.3 0.5 0.4 0.1*  ?PROT 6.3 7.3 6.2* 5.8*  ?ALBUMIN 3.7 3.7 3.1* 2.8*  ? ?No results for input(s): LIPASE, AMYLASE in the last 168 hours. ?No results for input(s): AMMONIA in the last 168 hours. ?Coagulation profile ?No results for input(s): INR, PROTIME in the last 168 hours. ? ?CBC: ?Recent Labs  ?Lab 06/04/21 ?0808 06/05/21 ?0326 06/06/21 ?0250 06/07/21 ?0238  06/08/21 ?0315  ?WBC 1.9* 1.8* 1.9* 1.8* 1.7*  ?NEUTROABS 0.0* 0.1* 0.1* 0.1* 0.1*  ?HGB 11.6* 11.7* 11.0* 11.2* 12.1  ?HCT 36.0 36.4 35.0* 34.9* 37.4  ?MCV 84.9 85.4 85.8 84.3 85.6  ?PLT 227 226 210 219 176  ? ?Cardiac Enzymes: ?No results for input(s): CKTOTAL, CKMB, CKMBINDEX, TROPONINI in the last 168 hours. ?BNP: ?Invalid input(s): POCBNP ?CBG: ?No results for input(s): GLUCAP in the last 168 hours. ?D-Dimer ?No results for input(s): DDIMER in the last 72 hours. ?Hgb A1c ?No results for input(s): HGBA1C in the last 72 hours. ?Lipid Profile ?No results for input(s): CHOL, HDL, LDLCALC, TRIG, CHOLHDL, LDLDIRECT in the last 72 hours. ?Thyroid function studies ?No results for input(s): TSH, T4TOTAL, T3FREE, THYROIDAB in the last 72 hours. ? ?Invalid input(s): FREET3 ?Anemia work up ?No results for input(s): VITAMINB12, FOLATE, FERRITIN, TIBC, IRON, RETICCTPCT in the last 72 hours. ?Microbiology ?Recent Results (from the past 240 hour(s))  ?Resp Panel by RT-PCR (Flu A&B, Covid) Nasopharyngeal Swab     Status: None  ? Collection Time: 06/03/21  7:23 PM  ? Specimen: Nasopharyngeal Swab; Nasopharyngeal(NP) swabs in vial transport medium  ?Result Value Ref Range Status  ? SARS Coronavirus 2 by RT PCR NEGATIVE NEGATIVE Final  ?  Comment: (NOTE) ?SARS-CoV-2  target nucleic acids are NOT DETECTED. ? ?The SARS-CoV-2 RNA is generally detectable in upper respiratory ?specimens during the acute phase of infection. The lowest ?concentration of SARS-CoV-2 viral copies this assay can detect is ?138 copies/mL. A negative result does not preclude SARS-Cov-2 ?infection and should not be used as the sole basis for treatment or ?other patient management decisions. A negative result may occur with  ?improper specimen collection/handling, submission of specimen other ?than nasopharyngeal swab, presence of viral mutation(s) within the ?areas targeted by this assay, and inadequate number of viral ?copies(<138 copies/mL). A negative result  must be combined with ?clinical observations, patient history, and epidemiological ?information. The expected result is Negative. ? ?Fact Sheet for Patients:  ?BloggerCourse.com ? ?Fact Sheet for Healthcare Providers:  ?SeriousBroker.it ? ?This test is no t yet approved or cleared by the [place] FDA and  ?has been authorized for detection and/or diagnosis of SARS-CoV-2 by ?FDA under an Emergency Use Authorization (EUA). This EUA will remain  ?in effect (meaning this test can be used) for the duration of the ?COVID-19 declaration under Section 564(b)(1) of the Act, 21 ?U.S.C.section 360bbb-3(b)(1), unless the authorization is terminated  ?or revoked sooner.  ? ? ?  ? Influenza A by PCR NEGATIVE NEGATIVE Final  ? Influenza B by PCR NEGATIVE NEGATIVE Final  ?  Comment: (NOTE) ?The Xpert Xpress SARS-CoV-2/FLU/RSV plus assay is intended as an aid ?in the diagnosis of influenza from Nasopharyngeal swab specimens and ?should not be used as a sole basis for treatment. Nasal washings and ?aspirates are unacceptable for Xpert Xpress SARS-CoV-2/FLU/RSV ?testing. ? ?Fact Sheet for Patients: ?BloggerCourse.com ? ?Fact Sheet for Healthcare Providers: ?SeriousBroker.it ? ?This test is not yet approved or cleared by the [place] FDA and ?has been authorized for detection and/or diagnosis of SARS-CoV-2 by ?FDA under an Emergency Use Authorization (EUA). This EUA will remain ?in effect (meaning this test can be used) for the duration of the ?COVID-19 declaration under Section 564(b)(1) of the Act, 21 U.S.C. ?section 360bbb-3(b)(1), unless the authorization is terminated or ?revoked. ? ?Performed at [facility], [address] [place] ?[place], [place] [address] ?  ?Culture, blood (routine x 2)     Status: None (Preliminary result)  ? Collection Time: 06/04/21  8:08 AM  ? Specimen: Right Antecubital; Blood  ?Result Value  Ref Range Status  ? Specimen Description   Final  ?  RIGHT ANTECUBITAL ?Performed at [facility], [address], [place], [place] [address] ?  ? Special Requests   Final  ?  BOTTLES DRAWN AEROBIC AND ANAEROBIC Blood Culture adequate volume ?Performed at [facility], [address], [place], [place] [address] ?  ? Culture   Final  ?  NO GROWTH 3 DAYS ?Performed at [facility], [address], [place], [place] [address] ?  ? Report Status PENDING  Incomplete  ?Culture, blood (routine x 2)     Status: None (Preliminary result)  ? Collection Time: 06/04/21  8:08 AM  ? Specimen: BLOOD RIGHT FOREARM  ?Result Value Ref Range Status  ? Specimen Description   Final  ?  BLOOD RIGHT FOREARM ?Performed at [facility], [address] [place], [place] [address] ?  ? Special Requests   Final  ?  BOTTLES DRAWN AEROBIC AND ANAEROBIC Blood Culture adequate volume ?Performed at [facility], [address], [place], [place] [address] ?  ? Culture   Final  ?  NO GROWTH 3 DAYS ?Performed at [facility], [address] [place], [place] [address] ?  ? Report Status PENDING  Incomplete  ?Urine Culture     Status: None  ? Collection Time: 06/04/21 10:38 AM  ? Specimen: Urine, Clean Catch  ?Result Value Ref Range Status  ? Specimen Description   Final  ?  URINE, CLEAN CATCH ?Performed at [facility], [address] [place], [place] [address] ?  ? Special Requests   Final  ?  NONE ?Performed at [facility], [address] [place], [place] [address] ?  ? Culture   Final  ?  NO GROWTH ?Performed at [facility], [address] [place], [place] [address] ?  ? Report Status 06/05/2021 FINAL  Final  ?Gastrointestinal Panel by PCR , Stool     Status: None  ? Collection Time: 06/04/21 10:41 AM  ? Specimen: STOOL  ?Result Value Ref Range Status  ? Campylobacter species NOT DETECTED NOT DETECTED Final  ?  Plesimonas shigelloides NOT DETECTED NOT DETECTED Final  ? Salmonella species NOT DETECTED NOT DETECTED Final  ? Yersinia enterocolitica NOT DETECTED NOT DETECTED Final  ? Vibrio species NOT DETECTED NOT DETECTED

## 2021-06-08 NOTE — Progress Notes (Signed)
?  [facility]   ? ?Date of Admission:  06/03/2021   Total days of antibiotics 6 ?        ? ?ID: Diane Wells is a 41 y.o. female with new onset  febrile neutropenia ?Principal Problem: ?  Neutropenic fever (HCC) ?Active Problems: ?  Moderate persistent asthma without complication ?  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  SIRS (systemic inflammatory response syndrome) (HCC) ?  Essential (primary) hypertension ?  Rheumatoid arteritis (HCC) ?  Secondary adrenal insufficiency (HCC) ?  Cyst of ovary, right ?  Left thyroid nodule ? ? ? ?Subjective: ?Increasing mouth pain, swelling of gum line; diffuse joint pain ?Medications:  ? chlorhexidine  15 mL Mouth/Throat BID  ? enoxaparin (LOVENOX) injection  40 mg Subcutaneous Q24H  ? feeding supplement  237 mL Oral BID BM  ? gabapentin  300 mg Oral BID  ? hydrocortisone  10 mg Oral Daily  ? hydrocortisone  5 mg Oral q1800  ? losartan  75 mg Oral Daily  ? magic mouthwash w/lidocaine  5 mL Oral Q4H  ? mometasone-formoterol  2 puff Inhalation BID  ? Tbo-filgastrim (GRANIX) SQ  480 mcg Subcutaneous q1800  ? ? ?Objective: ?Vital signs in last 24 hours: ?Temp:  [99 ?F (37.2 ?C)-99.3 ?F (37.4 ?C)] 99.3 ?F (37.4 ?C) (03/15 1400) ?Pulse Rate:  [98-110] 106 (03/15 1402) ?Resp:  [11-20] 16 (03/15 1402) ?BP: (137-147)/(88-94) 137/88 (03/15 1400) ?SpO2:  [93 %-96 %] 94 % (03/15 1400) ? ?Physical Exam  ?Constitutional:  oriented to person, place, and time. appears well-developed and well-nourished. No distress.  ?HENT: [place]/AT, PERRLA, no scleral icterus/ aphous ulcer to buccal mucosa, and right side of tongue, tip of tongue, erythema to gum line and some swelling ?Mouth/Throat: Oropharynx is clear and moist. No oropharyngeal exudate.  ?Cardiovascular: Normal rate, regular rhythm and normal heart sounds. Exam reveals no gallop and no friction rub.  ?No murmur heard.  ?Pulmonary/Chest: Effort normal and breath sounds normal. No  respiratory distress.  has no wheezes.  ?Neck = supple, no nuchal rigidity ?Abdominal: Soft. Bowel sounds are normal.  exhibits no distension. There is no tenderness.  ?Lymphadenopathy: no cervical adenopathy. No axillary adenopathy ?Neurological: alert and oriented to person, place, and time.  ?Skin: Skin is warm and dry. No rash noted. No erythema.  ?Psychiatric: a normal mood and affect.  behavior is normal.  ? ? ?Lab Results ?Recent Labs  ?  06/06/21 ?0250 06/07/21 ?0238 06/08/21 ?0315  ?WBC 1.9* 1.8* 1.7*  ?HGB 11.0* 11.2* 12.1  ?HCT 35.0* 34.9* 37.4  ?NA 133*  --   --   ?K 3.8  --   --   ?CL 104  --   --   ?CO2 22  --   --   ?BUN 7  --   --   ?CREATININE 0.88  --   --   ? ?Liver Panel ?Recent Labs  ?  06/06/21 ?0250  ?PROT 5.8*  ?ALBUMIN 2.8*  ?AST 12*  ?ALT 15  ?ALKPHOS 35*  ?BILITOT 0.1*  ? ?Lab Results  ?Component Value Date  ? ESRSEDRATE 25 (H) 06/03/2021  ? ? ? ?Microbiology: ?reviewed ?Studies/Results: ?No results found. ? ? ?Assessment/Plan: ?Neutropenia = new-onset. Getting BM on thur/Friday for evaluation ? ?Aphous ulcer = will try chlorhexidine mouth wash ? ?Odontigenic infection? = unclear if changes only due to decreased neutrophils vs concominant infection. Will change cefepime to amp/sub ? ?Judyann Munson ?[facility] ?Pager: [phone] ? ?06/08/2021, 4:08 PM ? ? ? ? ? ?

## 2021-06-09 DIAGNOSIS — D709 Neutropenia, unspecified: Secondary | ICD-10-CM | POA: Diagnosis not present

## 2021-06-09 DIAGNOSIS — R5081 Fever presenting with conditions classified elsewhere: Secondary | ICD-10-CM | POA: Diagnosis not present

## 2021-06-09 LAB — BASIC METABOLIC PANEL
Anion gap: 7 (ref 5–15)
BUN: 11 mg/dL (ref 6–20)
CO2: 27 mmol/L (ref 22–32)
Calcium: 7.9 mg/dL — ABNORMAL LOW (ref 8.9–10.3)
Chloride: 101 mmol/L (ref 98–111)
Creatinine, Ser: 1.1 mg/dL — ABNORMAL HIGH (ref 0.44–1.00)
GFR, Estimated: >60 mL/min (ref >60)
Glucose, Bld: 94 mg/dL (ref 70–99)
Potassium: 3.8 mmol/L (ref 3.5–5.1)
Sodium: 135 mmol/L (ref 135–145)

## 2021-06-09 LAB — CBC WITH DIFFERENTIAL/PLATELET
Abs Immature Granulocytes: 0 10*3/uL (ref 0.00–0.07)
Basophils Absolute: 0 10*3/uL (ref 0.0–0.1)
Basophils Relative: 1 %
Eosinophils Absolute: 0.1 10*3/uL (ref 0.0–0.5)
Eosinophils Relative: 4 %
HCT: 36.9 % (ref 36.0–46.0)
Hemoglobin: 11.7 g/dL — ABNORMAL LOW (ref 12.0–15.0)
Immature Granulocytes: 0 %
Lymphocytes Relative: 43 %
Lymphs Abs: 1.1 10*3/uL (ref 0.7–4.0)
MCH: 26.9 pg (ref 26.0–34.0)
MCHC: 31.7 g/dL (ref 30.0–36.0)
MCV: 84.8 fL (ref 80.0–100.0)
Monocytes Absolute: 1 10*3/uL (ref 0.1–1.0)
Monocytes Relative: 38 %
Neutro Abs: 0.4 10*3/uL — CL (ref 1.7–7.7)
Neutrophils Relative %: 14 %
Platelets: 143 10*3/uL — ABNORMAL LOW (ref 150–400)
RBC: 4.35 MIL/uL (ref 3.87–5.11)
RDW: 13.5 % (ref 11.5–15.5)
WBC: 2.5 10*3/uL — ABNORMAL LOW (ref 4.0–10.5)
nRBC: 1.2 % — ABNORMAL HIGH (ref 0.0–0.2)

## 2021-06-09 LAB — MAGNESIUM: Magnesium: 1.8 mg/dL (ref 1.7–2.4)

## 2021-06-09 LAB — CULTURE, BLOOD (ROUTINE X 2)
Culture: NO GROWTH
Culture: NO GROWTH
Special Requests: ADEQUATE
Special Requests: ADEQUATE

## 2021-06-09 LAB — CMV DNA, QUANTITATIVE, PCR
CMV DNA Quant: NEGATIVE IU/mL
Log10 CMV Qn DNA Pl: UNDETERMINED log10 IU/mL

## 2021-06-09 MED ORDER — DARBEPOETIN ALFA 150 MCG/0.3ML IJ SOSY: 150.0000 ug | PREFILLED_SYRINGE | Freq: Once | INTRAMUSCULAR | Status: DC

## 2021-06-09 MED ORDER — SODIUM CHLORIDE 0.9 % IV SOLN: INTRAVENOUS | Status: AC

## 2021-06-09 NOTE — Progress Notes (Signed)
?PROGRESS NOTE ? ? ? ?Diane Wells  WNU:[id number] DOB: [birth year] DOA: 06/03/2021 ?PCP: Darreld Mclean, MD  ? ? ? ?Brief Narrative:  ? ?41 year old with past medical history significant for asthma, RA, granulomatosis with polyangiitis, arthritis, iatrogenic Cushing's, hypertension, CKD presents with persistence fever, fatigue and abnormal labs.   ? ?5 days ago she developed fevers, sinus pressure, she went to urgent care and was a started on Augmentin.  She also subsequently developed oral lesions, and decreased oral intake.  Saw her PCP and lab work revealed leukopenia ANC 0.0 she was referred for admission. ? ?She has not been taking her CellCept and Paxil due to her acute illness. ? ?Patient was found to be tachycardic, febrile, creatinine 1.2, white blood cell 1.5 ANC 0.0.  ESR 25, Monospot test negative.  Respiratory panel and COVID-negative.  Chest x-ray no acute abnormality. ? ?Admitted with neutropenic fever. ID and oncology following. Started on broad spectrum antibiotics. Started on Granix.  ? ? ? ?Subjective: ? ?C/o right side headache, right side lymph adenopathy may be slightly smaller compared to before ?Mouth ulcer remains the same, concerned she is developing abscess in left lower gum/teeth ?Diarrhea has stopped  ?No cough ?T max 99.1 last 24hrs ?Mild sinus tachycardia ?Bp stable ?ANC appears is going up slightly ? ? ?Assessment & Plan: ? Principal Problem: ?  Neutropenic fever ([place]) ?Active Problems: ?  Moderate persistent asthma without complication ?  Granulomatosis with polyangiitis with pulmonary involvement ([place]) ?  SIRS (systemic inflammatory response syndrome) (HCC) ?  Essential (primary) hypertension ?  Rheumatoid arteritis ([place]) ?  Secondary adrenal insufficiency (HCC) ?  Cyst of ovary, right ?  Left thyroid nodule ? ? ? ?Assessment and Plan: ? ? ?* Neutropenic fever (HCC)/SIRS ?-Patient presents with fever, tachycardia, neutropenia white blood cell 1.5 ANC 0.0. right cervical  Lymphadenopathy/ oral ulcers.  ? ?-Bartonella IGG and IgM negative.  ?-RMSF negative.  ?-Cmv IgM negative, cmv IgG +, CMV DNA quantitative in process ?-Cdiff negative, gi pcr panel negative, diarrhea has stopped  ?-Urine culture negative ?-Blood culture no growth ?-Chest x-ray negative for acute infection. Ct ab/pel no acute infectious process identified  ?-covid screening negative, flu negative ?-She had 2-3 small oral ulcers white plaque. Started on magic mouth wash, fluconazole. ?-Treated initially with Linezolid and Cefepime, then on Cefepime and iv diflucan, now on unasyn/dilfucan ?- -Case discussed with ID Dr. Baxter Flattery who recommends CT maxillofacial/CT soft tissue neck , patient agrees, order placed  ? ?Neutropenia: right cervical lymphadenopathy ?-Appreciate oncology Dr Burr Medico evaluation ?-CT abdomen-pelvis chest, no lymphadenopathy, splenomegaly, thyroid nodule, right ovary cyst.  ?-Started on Granix 3/11 ?-ANC; remain at 0.1 ?-my hospitalist colleague Discussed with rheumatology Dr Trudie Reed, neutropenia could be related to infection, autoimmune process, medications. Felty syndrome maybe, patient has not required significant medications management for her RA, her treatment has been to target her vasculitis. She agrees with hematologist evaluation. ?-plan for bone marrow biopsy which is scheduled on 3/17, then outpatient oncology follow up ? ? ? ?Essential (primary) hypertension ? Cozaar held initially in setting of infection ?Resumed on 3/14 ?Monitor cr level ? ?Secondary adrenal insufficiency (HCC) ?Continue with Hydrocortisone 10 mg in am and 5 mg afternoon.  ?She takes 5 mh hydrocortisone at home. Dose was increase due to acute illness.  ?I discussed with Dr. Buddy Duty,  ok to use hydrocortisone 10 mg in am and 5 mg afternoon. Taper down as needed, depending on clinical stability. If she is discharge home soon, ok to  discharge on 10 mg daily for 2 -3 days after discharge. Follow up with him after discharge.   ?-Consider transition to hydrocortisone 10 mg daily from 3/15 if stable.  ? 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  ?Continue to hold Plaquenil and Cellcept due to neutropenic fever.  ?Follows with Dr Trudie Reed rheumatology  ? ? ?Moderate persistent asthma without complication ?Continue with Dulera and PRN albuterol.  ? ? ? ?Left thyroid nodule ?Thyroid US; Borderline thyromegaly, solitary 2.4 cm TR3 left nodule.  Recommend annual/biennial US>  ?She will follow up with Dr Buddy Duty, endocrinologist.  ? ?Cyst of ovary, right ?Per CT likely functional.  ?Follow out patient.  ? ? ? Body mass index is 46.2 kg/m?Marland Kitchen.  Meet criteria for class III obesity ?. ? ? ?I have Reviewed nursing notes, Vitals, pain scores, I/o's, Lab results and  imaging results since pt's last encounter, details please see discussion above Case discussed with infectious disease Dr. Baxter Flattery  ?I ordered the following labs:  ?Unresulted Labs (From admission, onward)  ? ?  Start     Ordered  ? 06/10/21 3545  Basic metabolic panel  Tomorrow morning,   R       ?Question:  Specimen collection method  Answer:  IV Team=IV Team collect  ? 06/09/21 0938  ? 62/56/38 9373  CYCLIC CITRUL PEPTIDE ANTIBODY, IGG/IGA  Tomorrow morning,   R       ?Question:  Specimen collection method  Answer:  IV Team=IV Team collect  ? 06/09/21 1438  ? 06/10/21 0500  Rheumatoid factor  Tomorrow morning,   R       ?Question:  Specimen collection method  Answer:  IV Team=IV Team collect  ? 06/09/21 1438  ? 06/07/21 1136  CMV DNA, quantitative, PCR  Once,   R       ?Question:  Specimen collection method  Answer:  IV Team=IV Team collect  ? 06/07/21 1135  ? 06/06/21 0500  CBC with Differential/Platelet  Daily,   R     ? 06/05/21 0717  ? ?  ?  ? ?  ? ? ? ?DVT prophylaxis: enoxaparin (LOVENOX) injection 40 mg Start: 06/05/21 1000 ? ? ?Code Status:   Code Status:  Full Code ? ?Family Communication: Patient ?Disposition:  ? ?Status is: Inpatient ? ? Dispo: The patient is from: home ?             Anticipated d/c is to: home on Friday or Saturday if she is stable after bone marrow biopsy ?             ?Antimicrobials:   ?Anti-infectives (From admission, onward)  ? ? Start     Dose/Rate Route Frequency Ordered Stop  ? 06/08/21 1800  Ampicillin-Sulbactam (UNASYN) 3 g in sodium chloride 0.9 % 100 mL IVPB       ? 3 g ?200 mL/hr over 30 Minutes Intravenous Every 6 hours 06/08/21 1608    ? 06/05/21 1000  linezolid (ZYVOX) IVPB 600 mg  Status:  Discontinued       ? 600 mg ?300 mL/hr over 60 Minutes Intravenous Every 12 hours 06/04/21 0854 06/06/21 1434  ? 06/05/21 0900  vancomycin (VANCOREADY) IVPB 1750 mg/350 mL  Status:  Discontinued       ?See Hyperspace for full Linked Orders Report.  ? 1,750 mg ?175 mL/hr over 120 Minutes Intravenous Every 24 hours 06/04/21 0716 06/04/21  6438  ? 06/04/21 1645  fluconazole (DIFLUCAN) IVPB 200 mg       ? 200 mg ?100 mL/hr over 60 Minutes Intravenous Every 24 hours 06/04/21 1558    ? 06/04/21 1000  hydroxychloroquine (PLAQUENIL) tablet 200 mg  Status:  Discontinued       ? 200 mg Oral 2 times daily 06/04/21 0008 06/04/21 0034  ? 06/04/21 0815  vancomycin (VANCOREADY) IVPB 2000 mg/400 mL  Status:  Discontinued       ?See Hyperspace for full Linked Orders Report.  ? 2,000 mg ?200 mL/hr over 120 Minutes Intravenous  Once 06/04/21 0716 06/04/21 0853  ? 06/04/21 0400  ceFEPIme (MAXIPIME) 2 g in sodium chloride 0.9 % 100 mL IVPB  Status:  Discontinued       ? 2 g ?200 mL/hr over 30 Minutes Intravenous Every 8 hours 06/04/21 0016 06/04/21 0034  ? 06/04/21 0400  ceFEPIme (MAXIPIME) 2 g in sodium chloride 0.9 % 100 mL IVPB  Status:  Discontinued       ? 2 g ?200 mL/hr over 30 Minutes Intravenous Every 8 hours 06/04/21 0034 06/08/21 1607  ? 06/03/21 1930  ceFEPIme (MAXIPIME) 2 g in sodium chloride 0.9 % 100 mL IVPB       ? 2 g ?200 mL/hr over 30 Minutes  Intravenous  Once 06/03/21 1929 06/03/21 2037  ? ?  ? ? ? ? ? ? ?Objective: ?Vitals:  ? 06/08/21 1931 06/08/21 2217 06/09/21 0450 06/09/21 1314  ?BP:  (!) 147/90 140/86 (!) 148/99  ?Pulse:  (!) 104 (!) 105 99  ?Resp:  18

## 2021-06-09 NOTE — Progress Notes (Addendum)
Diane Wells   DOB:1980-11-03   [id number]   IC:[id number] ? ?Hematology follow-up ? ?Subjective: Remains afebrile and other vital signs are stable.  She continues to have mucositis, fatigue, headaches.  Right neck adenopathy might be smaller.  Diarrhea nearly resolved. ? ?Objective:  ?Vitals:  ? 06/08/21 2217 06/09/21 0450  ?BP: (!) 147/90 140/86  ?Pulse: (!) 104 (!) 105  ?Resp: 18 16  ?Temp: 98.4 ?F (36.9 ?C) 99.1 ?F (37.3 ?C)  ?SpO2: 95% (!) 89%  ?  Body mass index is 46.2 kg/m?. ? ?Intake/Output Summary (Last 24 hours) at 06/09/2021 1058 ?Last data filed at 06/09/2021 1055 ?Gross per 24 hour  ?Intake 1500.33 ml  ?Output --  ?Net 1500.33 ml  ? ? ?HEENT: Plaque-like oral ulcers on her tongue and buccal mucosa  ?sclerae unicteric ? MSK no focal spinal tenderness, no peripheral edema ? Neuro nonfocal ?  ? ?CBG (last 3)  ?No results for input(s): GLUCAP in the last 72 hours. ? ? ?Labs:  ? ?Urine Studies ?No results for input(s): UHGB, CRYS in the last 72 hours. ? ?Invalid input(s): UACOL, UAPR, USPG, UPH, UTP, UGL, UKET, UBIL, UNIT, UROB, ULEU, UEPI, UWBC, URBC, UBAC, CAST, UCOM, BILUA ? ?Basic Metabolic Panel: ?Recent Labs  ?Lab 06/03/21 ?1750 06/04/21 ?0808 06/05/21 ?0326 06/06/21 ?0250 06/09/21 ?[id number]  ?NA 135 134* 133* 133* 135  ?K 4.2 3.8 3.4* 3.8 3.8  ?CL 101 104 104 104 101  ?CO2 25 22 21* 22 27  ?GLUCOSE 100* 100* 99 90 94  ?BUN 15 12 10 7 11   ?CREATININE 1.29* 1.05* 0.87 0.88 1.10*  ?CALCIUM 8.6* 7.9* 7.9* 7.7* 7.9*  ?MG  --   --   --   --  1.8  ? ?GFR ?Estimated Creatinine Clearance: 97.1 mL/min (A) (by C-G formula based on SCr of 1.1 mg/dL (H)). ?Liver Function Tests: ?Recent Labs  ?Lab 06/03/21 ?1104 06/03/21 ?1750 06/04/21 ?0808 06/06/21 ?0250  ?AST 11 14* 13* 12*  ?ALT 9 12 13 15   ?ALKPHOS 42 45 36* 35*  ?BILITOT 0.3 0.5 0.4 0.1*  ?PROT 6.3 7.3 6.2* 5.8*  ?ALBUMIN 3.7 3.7 3.1* 2.8*  ? ?No results for input(s): LIPASE, AMYLASE in the last 168 hours. ?No results for input(s): AMMONIA in the last 168  hours. ?Coagulation profile ?No results for input(s): INR, PROTIME in the last 168 hours. ? ?CBC: ?Recent Labs  ?Lab 06/05/21 ?0326 06/06/21 ?0250 06/07/21 ?0238 06/08/21 ?[id number] 06/09/21 ?[id number]  ?WBC 1.8* 1.9* 1.8* 1.7* 2.5*  ?NEUTROABS 0.1* 0.1* 0.1* 0.1* 0.4*  ?HGB 11.7* 11.0* 11.2* 12.1 11.7*  ?HCT 36.4 35.0* 34.9* 37.4 36.9  ?MCV 85.4 85.8 84.3 85.6 84.8  ?PLT 226 210 219 176 143*  ? ?Cardiac Enzymes: ?No results for input(s): CKTOTAL, CKMB, CKMBINDEX, TROPONINI in the last 168 hours. ?BNP: ?Invalid input(s): POCBNP ?CBG: ?No results for input(s): GLUCAP in the last 168 hours. ?D-Dimer ?No results for input(s): DDIMER in the last 72 hours. ?Hgb A1c ?No results for input(s): HGBA1C in the last 72 hours. ?Lipid Profile ?No results for input(s): CHOL, HDL, LDLCALC, TRIG, CHOLHDL, LDLDIRECT in the last 72 hours. ?Thyroid function studies ?No results for input(s): TSH, T4TOTAL, T3FREE, THYROIDAB in the last 72 hours. ? ?Invalid input(s): FREET3 ?Anemia work up ?No results for input(s): VITAMINB12, FOLATE, FERRITIN, TIBC, IRON, RETICCTPCT in the last 72 hours. ?Microbiology ?Recent Results (from the past 240 hour(s))  ?Resp Panel by RT-PCR (Flu A&B, Covid) Nasopharyngeal Swab     Status: None  ? Collection Time: 06/03/21  7:23 PM  ? Specimen: Nasopharyngeal Swab; Nasopharyngeal(NP) swabs in vial transport medium  ?Result Value Ref Range Status  ? SARS Coronavirus 2 by RT PCR NEGATIVE NEGATIVE Final  ?  Comment: (NOTE) ?SARS-CoV-2 target nucleic acids are NOT DETECTED. ? ?The SARS-CoV-2 RNA is generally detectable in upper respiratory ?specimens during the acute phase of infection. The lowest ?concentration of SARS-CoV-2 viral copies this assay can detect is ?138 copies/mL. A negative result does not preclude SARS-Cov-2 ?infection and should not be used as the sole basis for treatment or ?other patient management decisions. A negative result may occur with  ?improper specimen collection/handling, submission of specimen  other ?than nasopharyngeal swab, presence of viral mutation(s) within the ?areas targeted by this assay, and inadequate number of viral ?copies(<138 copies/mL). A negative result must be combined with ?clinical observations, patient history, and epidemiological ?information. The expected result is Negative. ? ?Fact Sheet for Patients:  ?EntrepreneurPulse.com.au ? ?Fact Sheet for Healthcare Providers:  ?IncredibleEmployment.be ? ?This test is no t yet approved or cleared by the [place] FDA and  ?has been authorized for detection and/or diagnosis of SARS-CoV-2 by ?FDA under an Emergency Use Authorization (EUA). This EUA will remain  ?in effect (meaning this test can be used) for the duration of the ?COVID-19 declaration under Section 564(b)(1) of the Act, 21 ?U.S.C.section 360bbb-3(b)(1), unless the authorization is terminated  ?or revoked sooner.  ? ? ?  ? Influenza A by PCR NEGATIVE NEGATIVE Final  ? Influenza B by PCR NEGATIVE NEGATIVE Final  ?  Comment: (NOTE) ?The Xpert Xpress SARS-CoV-2/FLU/RSV plus assay is intended as an aid ?in the diagnosis of influenza from Nasopharyngeal swab specimens and ?should not be used as a sole basis for treatment. Nasal washings and ?aspirates are unacceptable for Xpert Xpress SARS-CoV-2/FLU/RSV ?testing. ? ?Fact Sheet for Patients: ?EntrepreneurPulse.com.au ? ?Fact Sheet for Healthcare Providers: ?IncredibleEmployment.be ? ?This test is not yet approved or cleared by the [place] FDA and ?has been authorized for detection and/or diagnosis of SARS-CoV-2 by ?FDA under an Emergency Use Authorization (EUA). This EUA will remain ?in effect (meaning this test can be used) for the duration of the ?COVID-19 declaration under Section 564(b)(1) of the Act, 21 U.S.C. ?section 360bbb-3(b)(1), unless the authorization is terminated or ?revoked. ? ?Performed at [facility], [place]  [place], [place] [address] ?  ?Culture, blood (routine x 2)     Status: None  ? Collection Time: 06/04/21  8:08 AM  ? Specimen: Right Antecubital; Blood  ?Result Value Ref Range Status  ? Specimen Description   Final  ?  RIGHT ANTECUBITAL ?Performed at [facility], [place] [address] [place], [place] [address] ?  ? Special Requests   Final  ?  BOTTLES DRAWN AEROBIC AND ANAEROBIC Blood Culture adequate volume ?Performed at [facility], [place] [address], [place], [place] [address] ?  ? Culture   Final  ?  NO GROWTH 5 DAYS ?Performed at [facility], [place] [address] [place], [place] [address] ?  ? Report Status 06/09/2021 FINAL  Final  ?Culture, blood (routine x 2)     Status: None  ? Collection Time: 06/04/21  8:08 AM  ? Specimen: BLOOD RIGHT FOREARM  ?Result Value Ref Range Status  ? Specimen Description   Final  ?  BLOOD RIGHT FOREARM ?Performed at [facility], [place] [address], [place], [place] [address] ?  ? Special Requests   Final  ?  BOTTLES DRAWN AEROBIC AND ANAEROBIC Blood Culture adequate volume ?Performed at [facility], [place] [address] [place], [place] [address] ?  ? Culture   Final  ?  NO GROWTH 5 DAYS ?Performed at [facility], [place] [address], [place], [place] [address] ?  ? Report Status 06/09/2021 FINAL  Final  ?Urine Culture     Status: None  ? Collection Time: 06/04/21 10:38 AM  ? Specimen: Urine, Clean Catch  ?Result Value Ref Range Status  ? Specimen Description   Final  ?  URINE, CLEAN CATCH ?Performed at [facility], [place] [address], [place], [place] [address] ?  ? Special Requests   Final  ?  NONE ?Performed at [facility], [place] [address], [place], [place] [address] ?  ? Culture   Final  ?  NO GROWTH ?Performed at [facility], [place] [address], [place],  [address] ?  ? Report Status 06/05/2021 FINAL  Final  ?Gastrointestinal Panel by PCR , Stool     Status: None  ?  Collection Time: 06/04/21 10:41 AM  ? Specimen: STOOL  ?Result Value Ref Range Status  ? Campylobacter species NOT DETECTED NOT DETECTED Final  ? Plesimonas shigelloides NOT DETECTED NOT DETECTED Final  ? Sal

## 2021-06-09 NOTE — Progress Notes (Signed)
Date and time results received: 06/09/21 at 0406 ? ? ?Test: ANC (absolute neutrophil count) ?Critical Value: 0.4 ? ?Name of Provider Notified: Provider already aware pt receiving Granix injection daily.  ?Diane RossettiJ. Daniel, NP notified via secure chat. ? ?Orders Received? Or Actions Taken?: No new orders. ?

## 2021-06-09 NOTE — Plan of Care (Signed)
  Problem: Activity: Goal: Risk for activity intolerance will decrease Outcome: Progressing   Problem: Pain Managment: Goal: General experience of comfort will improve Outcome: Progressing   Problem: Safety: Goal: Ability to remain free from injury will improve Outcome: Progressing   

## 2021-06-10 ENCOUNTER — Encounter (HOSPITAL_COMMUNITY): Payer: Self-pay

## 2021-06-10 ENCOUNTER — Inpatient Hospital Stay (HOSPITAL_COMMUNITY): Payer: BC Managed Care – PPO

## 2021-06-10 ENCOUNTER — Encounter (HOSPITAL_COMMUNITY): Payer: Self-pay | Admitting: Internal Medicine

## 2021-06-10 DIAGNOSIS — R5081 Fever presenting with conditions classified elsewhere: Secondary | ICD-10-CM | POA: Diagnosis not present

## 2021-06-10 DIAGNOSIS — D709 Neutropenia, unspecified: Secondary | ICD-10-CM | POA: Diagnosis not present

## 2021-06-10 LAB — CBC WITH DIFFERENTIAL/PLATELET
Abs Immature Granulocytes: 0.9 10*3/uL — ABNORMAL HIGH (ref 0.00–0.07)
Band Neutrophils: 2 %
Basophils Absolute: 0 10*3/uL (ref 0.0–0.1)
Basophils Relative: 1 %
Eosinophils Absolute: 0.3 10*3/uL (ref 0.0–0.5)
Eosinophils Relative: 6 %
HCT: 39 % (ref 36.0–46.0)
Hemoglobin: 12.4 g/dL (ref 12.0–15.0)
Lymphocytes Relative: 42 %
Lymphs Abs: 1.8 10*3/uL (ref 0.7–4.0)
MCH: 26.9 pg (ref 26.0–34.0)
MCHC: 31.8 g/dL (ref 30.0–36.0)
MCV: 84.6 fL (ref 80.0–100.0)
Metamyelocytes Relative: 5 %
Monocytes Absolute: 0.4 10*3/uL (ref 0.1–1.0)
Monocytes Relative: 9 %
Myelocytes: 16 %
Neutro Abs: 0.9 10*3/uL — ABNORMAL LOW (ref 1.7–7.7)
Neutrophils Relative %: 19 %
Platelets: UNDETERMINED 10*3/uL (ref 150–400)
RBC: 4.61 MIL/uL (ref 3.87–5.11)
RDW: 13.5 % (ref 11.5–15.5)
WBC: 4.2 10*3/uL (ref 4.0–10.5)
nRBC: 1.2 % — ABNORMAL HIGH (ref 0.0–0.2)

## 2021-06-10 LAB — BASIC METABOLIC PANEL
Anion gap: 7 (ref 5–15)
BUN: 10 mg/dL (ref 6–20)
CO2: 28 mmol/L (ref 22–32)
Calcium: 7.9 mg/dL — ABNORMAL LOW (ref 8.9–10.3)
Chloride: 100 mmol/L (ref 98–111)
Creatinine, Ser: 0.99 mg/dL (ref 0.44–1.00)
GFR, Estimated: >60 mL/min (ref >60)
Glucose, Bld: 94 mg/dL (ref 70–99)
Potassium: 4.1 mmol/L (ref 3.5–5.1)
Sodium: 135 mmol/L (ref 135–145)

## 2021-06-10 MED ORDER — FLUMAZENIL 0.5 MG/5ML IV SOLN
INTRAVENOUS | Status: AC
  Filled 2021-06-10: qty 5

## 2021-06-10 MED ORDER — HYDROXYCHLOROQUINE SULFATE 200 MG PO TABS: 200.0000 mg | ORAL_TABLET | Freq: Two times a day (BID) | ORAL | Status: AC

## 2021-06-10 MED ORDER — MIDAZOLAM HCL 2 MG/2ML IJ SOLN
INTRAMUSCULAR | Status: AC
  Filled 2021-06-10: qty 4

## 2021-06-10 MED ORDER — FENTANYL CITRATE (PF) 100 MCG/2ML IJ SOLN
INTRAMUSCULAR | Status: AC | PRN
  Administered 2021-06-10: 50 ug via INTRAVENOUS

## 2021-06-10 MED ORDER — OXYCODONE-ACETAMINOPHEN 5-325 MG PO TABS: 1.0000 | ORAL_TABLET | Freq: Four times a day (QID) | ORAL | 0 refills | Status: DC | PRN

## 2021-06-10 MED ORDER — MIDAZOLAM HCL 2 MG/2ML IJ SOLN
INTRAMUSCULAR | Status: AC | PRN
  Administered 2021-06-10: 1 mg via INTRAVENOUS

## 2021-06-10 MED ORDER — CHLORHEXIDINE GLUCONATE 0.12 % MT SOLN: 15.0000 mL | Freq: Two times a day (BID) | OROMUCOSAL | 0 refills | Status: DC

## 2021-06-10 MED ORDER — IOHEXOL 300 MG/ML  SOLN
75.0000 mL | Freq: Once | INTRAMUSCULAR | Status: AC | PRN
  Administered 2021-06-10: 75 mL via INTRAVENOUS

## 2021-06-10 MED ORDER — NALOXONE HCL 0.4 MG/ML IJ SOLN
INTRAMUSCULAR | Status: AC
  Filled 2021-06-10: qty 1

## 2021-06-10 MED ORDER — SIMETHICONE 80 MG PO CHEW: 80.0000 mg | CHEWABLE_TABLET | Freq: Four times a day (QID) | ORAL | 0 refills | Status: DC | PRN

## 2021-06-10 MED ORDER — FENTANYL CITRATE (PF) 100 MCG/2ML IJ SOLN
INTRAMUSCULAR | Status: DC
Start: 2021-06-10 — End: 2021-06-10
  Filled 2021-06-10: qty 4

## 2021-06-10 MED ORDER — MAGNESIUM SULFATE 2 GM/50ML IV SOLN: 2.0000 g | Freq: Once | INTRAVENOUS | Status: DC

## 2021-06-10 MED ORDER — LIDOCAINE HCL (PF) 1 % IJ SOLN
INTRAMUSCULAR | Status: AC | PRN
  Administered 2021-06-10: 10 mL via INTRADERMAL

## 2021-06-10 MED ORDER — SODIUM CHLORIDE (PF) 0.9 % IJ SOLN
INTRAMUSCULAR | Status: AC
  Filled 2021-06-10: qty 50

## 2021-06-10 MED ORDER — MYCOPHENOLATE MOFETIL 500 MG PO TABS: 1500.0000 mg | ORAL_TABLET | Freq: Two times a day (BID) | ORAL | Status: DC

## 2021-06-10 MED ORDER — ENOXAPARIN SODIUM 40 MG/0.4ML IJ SOSY: 40.0000 mg | PREFILLED_SYRINGE | INTRAMUSCULAR | Status: DC

## 2021-06-10 NOTE — Progress Notes (Signed)
Brief oncology note: ? ?The patient had a bone marrow biopsy performed earlier today.  Her CBC results were reviewed and her WBC is 4.2 and ANC is 0.9.   ? ?CBC ?   ?Component Value Date/Time  ? WBC 4.2 06/10/2021 0318  ? RBC 4.61 06/10/2021 0318  ? HGB 12.4 06/10/2021 0318  ? HCT 39.0 06/10/2021 0318  ? PLT PLATELET CLUMPS NOTED ON SMEAR, UNABLE TO ESTIMATE 06/10/2021 0318  ? MCV 84.6 06/10/2021 0318  ? MCH 26.9 06/10/2021 0318  ? MCHC 31.8 06/10/2021 0318  ? RDW 13.5 06/10/2021 0318  ? LYMPHSABS 1.8 06/10/2021 0318  ? MONOABS 0.4 06/10/2021 0318  ? EOSABS 0.3 06/10/2021 0318  ? BASOSABS 0.0 06/10/2021 0318  ? ? ? ?From our standpoint, okay to discharge to home if otherwise medically stable.  I have sent a message to her hospitalist informing her of this.  She should receive her Granix injection today prior to discharge.  We will hold off on any Neulasta or Udenyca at this point since her white blood cell count is improving.  Outpatient follow-up appointment is already scheduled as noted below. ? ?Future Appointments  ?Date Time Provider [place]  ?06/17/2021 12:30 PM [facility] None  ?06/17/2021  1:00 PM Truitt Merle, MD [facility] None  ? ? ?

## 2021-06-10 NOTE — Procedures (Signed)
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  ?Post-procedure Diagnosis: Same ? ?Technically successful CT guided bone marrow aspiration and biopsy of left iliac crest.  ? ?Complications: None Immediate ? ?EBL: None ? ?Signed: ?Sandi Mariscal ?Pager: [phone] ?06/10/2021, 10:58 AM ? ? ? ?

## 2021-06-10 NOTE — Discharge Summary (Signed)
?Discharge Summary ? ?EVI MCCOMB OZY:[id number] DOB: [birth year] ? ?PCP: Darreld Mclean, MD ? ?Admit date: 06/03/2021 ?Discharge date: 06/10/2021 ? ?Time spent: 37mns, more than 50% time spent on coordination of care.  ?Case discussed with hematology  and infectious disease before discharge. ? ?Recommendations for Outpatient Follow-up:  ?F/u with PCP within 1-2  weeks  for hospital discharge follow up  ?F/u with rheumatology as scheduled on 3/21 ?Follow-up with endocrinology as scheduled on 3/20 ?Follow-up with hematology on 3/23 ? ?Labs to repeat at hospital discharge follow up:cbc/bmp ? ? F/u on pending labs: ?Unresulted Labs (From admission, onward)  ? ?  Start     Ordered  ?      ? [id number] CYCLIC CITRUL PEPTIDE ANTIBODY, IGG/IGA        ?Question:  Specimen collection method  Answer:  IV Team=IV Team collect  ? 06/09/21 1438  ? 06/10/21 0500  Rheumatoid factor   ?Question:  Specimen collection method  Answer:  IV Team=IV Team collect  ? 06/09/21 1438  ?      ? ?  ?  ? ?  ? ? ?Discharge Diagnoses:  ?Active Hospital Problems  ? Diagnosis Date Noted  ? Neutropenic fever ([facility] 06/04/2021  ? Cyst of ovary, right 06/05/2021  ? Left thyroid nodule 06/05/2021  ? SIRS (systemic inflammatory response syndrome) ([facility] 06/03/2021  ? Secondary adrenal insufficiency ([facility] 05/31/2021  ? Essential (primary) hypertension 05/31/2021  ? Granulomatosis with polyangiitis with pulmonary involvement ([facility] 05/27/2019  ? Rheumatoid arteritis ([facility] [place] 03/27/2018  ? Moderate persistent asthma without complication 150/38/8828 ?  ?Resolved Hospital Problems  ?No resolved problems to display.  ? ? ?Discharge Condition: stable ? ?Diet recommendation: Regular diet ? ?Filed Weights  ? 06/03/21 1705  ?Weight: 133.8 kg  ? ? ?History of present illness: (Per admitting MD Dr. MTrilby Drummer ?Patient coming from: Home ?  ?Chief Complaint: Fever, fatigue, abnormal labs ?  ?HPI: Diane LYTTLEis a 41y.o. female with medical history significant of  asthma, granulomatosis with polyangiitis, reported arthritis, iatrogenic Cushing's, hypertension, CKD presenting with ongoing fevers fatigue and new relief discovered abnormal labs. ? ?Patient states that 5 days ago she woke with fevers and chills.  She also noted some sinus pressure and congestion.  She later developed some cervical lymphadenopathy.  Her fevers at the time were in the 10 2-1 03 range.  She had been having recurrent sinus infections and she went to be evaluated at urgent care.  She was started on Augmentin in addition to other supportive care. ?  ?She initially felt better but then started to develop oral lesions which were painful leading to decreased p.o. intake.  Went to see her PCP today and [facility] she was sent to the ED for further evaluation given her leukopenia and ANC of 0.0. ?  ?She states that due to her illness she has held her CellCept and Paxil and increased her home Cortef dose from 5 mg to 10 mg daily. ?  ?She denies chest pain, shortness of breath, abdominal pain, constipation, diarrhea, nausea, vomiting. ?  ?ED Course: Vital signs in the ED significant for tachycardia in the 100s.  Lab work-up included CMP with creatinine elevated to 1.29 from a baseline of 1, calcium 8.6.  CBC confirming leukopenia of 1.5 and ANC of 0.0.  ESR elevated to 25 and CRP elevated to 14.2.  Monospot test negative.  Lactic acid  normal with repeat pending.  Respiratory panel for flu and COVID-negative.  Urinalysis with only trace hemoglobin and rare bacteria.  Chest x-ray showed no acute abnormality.  EBV labs also ordered outpatient per chart review.  And are pending.  She received cefepime, oxycodone, losartan, albuterol in the ED.  Hematology was consulted and recommended admission with coverage with broad-spectrum antibiotics.  Hematology will consult in the morning. ? ?Hospital Course:  ?Principal Problem: ?  Neutropenic fever  ([place]) ?Active Problems: ?  Moderate persistent asthma without complication ?  Granulomatosis with polyangiitis with pulmonary involvement ([place]) ?  SIRS (systemic inflammatory response syndrome) (HCC) ?  Essential (primary) hypertension ?  Rheumatoid arteritis ([place]) ?  Secondary adrenal insufficiency (HCC) ?  Cyst of ovary, right ?  Left thyroid nodule ? ? ?Assessment and Plan: ? ?* Neutropenic fever (HCC)/SIRS/ right cervical Lymphadenopathy/ oral ulcers/diarrhea present on admission ?-Patient presents with fever, tachycardia, neutropenia ,white blood cell 1.5 ANC 0.0.  ?-She had 2-3 small oral ulcers white plaque treated with magic mouth wash, healing at discharge ?-Cdiff negative, gi pcr panel negative, diarrhea has stopped  ?- Ct ab/pel/chest  no acute infectious process identified ,no lymphadenopathy, +splenomegaly, +thyroid nodule, +right ovary cyst.  ?-CT soft tissue neck up to maxillary sinus confirmed right cervical lymph adenopathy,  no significant sinusitis, no dental abscess ? ?The following work up are unremarkable: ?-covid screening negative, flu negative  ?-Bartonella IGG and IgM negative.  ?-RMSF negative.  ?-CMV IgM negative, CMV IgG +, CMV DNA quantitative negative  ?--Urine culture negative ?-Blood culture no growth ?-Chest x-ray negative for acute infection ? ? ?-Appreciate infectious disease Dr. Baxter Flattery input , patient is treated initially with Linezolid and Cefepime, then Cefepime/ iv diflucan, then unasyn/iv dilfucan ?- Appreciate oncology Dr Burr Medico evaluation, patient received Granix since 3/11, last dose on 3/17, s/p CT guided bone marrow biopsy on 3/17 ?-her wbc has normalized at 4.2 , ANC improved to 900 on 3/17, expect continued improvement ?-she is cleared to d/c home from hematology and infectious disease.  No need of further antibiotic after discharge per infectious disease. ?  ?Felty's syndrome ? ?-due to neutropenia and splenomegaly, there is a concern of Felty's syndrome ?--my  hospitalist colleague Discussed with rheumatology Dr Trudie Reed who states neutropenia could be related to infection, autoimmune process, medications.  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 patient has not required significant medications management for her RA, her treatment has been to target her vasculitis. She agrees with hematologist eval ?-RF and antiCCP obtained on 3/16, result pending at discharge, please follow up.  ?  ?  ?  ?Essential (primary) hypertension ? Cozaar held initially in setting of infection ?Resumed on 3/14 ? ?  ?Secondary adrenal insufficiency (HCC) ?She takes 5 mg hydrocortisone at home. Dose was increase  8m qam and 583mqpm due to acute illness.  ? Dr. KeBuddy Dutyoked to use hydrocortisone 10 mg in am and 5 mg afternoon.  Follow up with him after discharge.  ?-she is to follow up with Dr KeBuddy Dutyn 3/20 ?  ?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)/Rheumatoid arteritis ([facility]?My colleague Discussed with Dr HaTrudie Reedho oked to hold Cellcept and plaquenil.  ?Continue to hold Plaquenil and Cellcept, Follows with Rheumatology Dr HaTrudie Reedn 3/21 ?  ?  ?Moderate persistent asthma without complication ?No wheezing, no cough, no sob, cxr unremarkable ?Continue with Dulera and PRN albuterol.  ?  ?  ?  ?Left thyroid nodule ?Thyroid USKoreaBorderline thyromegaly, solitary 2.4 cm TR3  left nodule.  Recommend annual/biennial US>  ?She will follow up with Dr Buddy Duty, endocrinologist.  ?  ?Cyst of ovary, right ?Per CT likely functional.  ?Follow out patient.  ?  ?  ? Body mass index is 46.2 kg/m?Marland Kitchen.  Meet criteria for class III obesity ?. ? ?Discharge Exam: ?BP 127/84 (BP Location: Right Arm)   Pulse 99   Temp 98.8 ?F (37.1 ?C) (Oral)   Resp 16   Ht '5\' 7"'  (1.702 m)   Wt 133.8 kg   LMP 05/31/2021   SpO2 91%   BMI 46.20 kg/m?  ? ?General: NAD ?Cardiovascular: RRR ?Respiratory: normal respiratory effort  ? ? ? ?Discharge Instructions   ? ? Diet general   Complete by: As directed ?  ? Increase activity slowly    Complete by: As directed ?  ? ?  ? ?Allergies as of 06/10/2021   ? ?   Reactions  ? Albuterol Shortness Of Breath, Other (See Comments)  ? Tolerates HFA inhaler, nebulizer treatment caused O2 drop  ? Other Anaphylaxis

## 2021-06-10 NOTE — Progress Notes (Signed)
MEDICATION-RELATED CONSULT NOTE  ? ?IR Procedure Consult - Anticoagulant/Antiplatelet PTA/Inpatient Med List Review by Pharmacist  ? ? ?Procedure: CT guided bone marrow aspiration and biopsy ?   ?Completed: 3/17 at ~1100 ? ?Post-Procedural bleeding risk per IR MD assessment:  standard ? ?Antithrombotic medications on inpatient or PTA profile prior to procedure:    ?Lovenox 40mg  q24  ? ?Recommended restart time per IR Post-Procedure Guidelines:  Day + 1 (next AM) ? ? ?Other considerations:   n/a ? ? ?Plan:    Restart Lovenox 40mg  q24 tomorrow per recommendations ? ? ?Hessie Knows, PharmD, BCPS ?Secure Chat if ?s ?06/10/2021 11:07 AM ? ? ?  ?

## 2021-06-11 LAB — RHEUMATOID FACTOR: Rheumatoid fact SerPl-aCnc: 11.5 IU/mL (ref <14.0)

## 2021-06-13 ENCOUNTER — Emergency Department (HOSPITAL_COMMUNITY): Payer: BC Managed Care – PPO

## 2021-06-13 ENCOUNTER — Ambulatory Visit: Payer: BC Managed Care – PPO | Admitting: Family Medicine

## 2021-06-13 ENCOUNTER — Emergency Department (HOSPITAL_COMMUNITY)
Admission: EM | Admit: 2021-06-13 | Discharge: 2021-06-13 | Disposition: A | Discharge status: Home / Self Care | Payer: BC Managed Care – PPO | Attending: Emergency Medicine | Admitting: Emergency Medicine

## 2021-06-13 ENCOUNTER — Encounter (HOSPITAL_COMMUNITY): Payer: Self-pay

## 2021-06-13 DIAGNOSIS — R0781 Pleurodynia: Secondary | ICD-10-CM | POA: Insufficient documentation

## 2021-06-13 DIAGNOSIS — N189 Chronic kidney disease, unspecified: Secondary | ICD-10-CM | POA: Diagnosis not present

## 2021-06-13 DIAGNOSIS — R079 Chest pain, unspecified: Secondary | ICD-10-CM | POA: Diagnosis not present

## 2021-06-13 DIAGNOSIS — D72829 Elevated white blood cell count, unspecified: Secondary | ICD-10-CM | POA: Insufficient documentation

## 2021-06-13 DIAGNOSIS — Z20822 Contact with and (suspected) exposure to covid-19: Secondary | ICD-10-CM | POA: Diagnosis not present

## 2021-06-13 DIAGNOSIS — E2749 Other adrenocortical insufficiency: Secondary | ICD-10-CM | POA: Diagnosis not present

## 2021-06-13 DIAGNOSIS — I129 Hypertensive chronic kidney disease with stage 1 through stage 4 chronic kidney disease, or unspecified chronic kidney disease: Secondary | ICD-10-CM | POA: Diagnosis not present

## 2021-06-13 DIAGNOSIS — J9811 Atelectasis: Secondary | ICD-10-CM | POA: Diagnosis not present

## 2021-06-13 DIAGNOSIS — E041 Nontoxic single thyroid nodule: Secondary | ICD-10-CM | POA: Diagnosis not present

## 2021-06-13 DIAGNOSIS — Z981 Arthrodesis status: Secondary | ICD-10-CM | POA: Diagnosis not present

## 2021-06-13 DIAGNOSIS — E242 Drug-induced Cushing's syndrome: Secondary | ICD-10-CM | POA: Diagnosis not present

## 2021-06-13 DIAGNOSIS — J45909 Unspecified asthma, uncomplicated: Secondary | ICD-10-CM | POA: Insufficient documentation

## 2021-06-13 LAB — CBC WITH DIFFERENTIAL/PLATELET
Abs Immature Granulocytes: 2.64 10*3/uL — ABNORMAL HIGH (ref 0.00–0.07)
Band Neutrophils: 22 %
Basophils Absolute: 0 10*3/uL (ref 0.0–0.1)
Basophils Relative: 0 %
Blasts: 0 %
Eosinophils Absolute: 0 10*3/uL (ref 0.0–0.5)
Eosinophils Relative: 0 %
HCT: 40 % (ref 36.0–46.0)
Hemoglobin: 12.5 g/dL (ref 12.0–15.0)
Lymphocytes Relative: 28 %
Lymphs Abs: 12.3 10*3/uL — ABNORMAL HIGH (ref 0.7–4.0)
MCH: 27.2 pg (ref 26.0–34.0)
MCHC: 31.3 g/dL (ref 30.0–36.0)
MCV: 87.1 fL (ref 80.0–100.0)
Metamyelocytes Relative: 2 %
Monocytes Absolute: 1.3 10*3/uL — ABNORMAL HIGH (ref 0.1–1.0)
Monocytes Relative: 3 %
Myelocytes: 4 %
Neutro Abs: 27.8 10*3/uL — ABNORMAL HIGH (ref 1.7–7.7)
Neutrophils Relative %: 41 %
Other: 0 %
Platelets: 73 10*3/uL — ABNORMAL LOW (ref 150–400)
Promyelocytes Relative: 0 %
RBC: 4.59 MIL/uL (ref 3.87–5.11)
RDW: 14.5 % (ref 11.5–15.5)
WBC: 44 10*3/uL — ABNORMAL HIGH (ref 4.0–10.5)
nRBC: 0 /100 WBC
nRBC: 0.2 % (ref 0.0–0.2)

## 2021-06-13 LAB — COMPREHENSIVE METABOLIC PANEL
ALT: 33 U/L (ref 0–44)
AST: 63 U/L — ABNORMAL HIGH (ref 15–41)
Albumin: 3.1 g/dL — ABNORMAL LOW (ref 3.5–5.0)
Alkaline Phosphatase: 86 U/L (ref 38–126)
Anion gap: 5 (ref 5–15)
BUN: 12 mg/dL (ref 6–20)
CO2: 27 mmol/L (ref 22–32)
Calcium: 8.3 mg/dL — ABNORMAL LOW (ref 8.9–10.3)
Chloride: 103 mmol/L (ref 98–111)
Creatinine, Ser: 1.05 mg/dL — ABNORMAL HIGH (ref 0.44–1.00)
GFR, Estimated: >60 mL/min (ref >60)
Glucose, Bld: 106 mg/dL — ABNORMAL HIGH (ref 70–99)
Potassium: 5 mmol/L (ref 3.5–5.1)
Sodium: 135 mmol/L (ref 135–145)
Total Bilirubin: 1.2 mg/dL (ref 0.3–1.2)
Total Protein: 6.2 g/dL — ABNORMAL LOW (ref 6.5–8.1)

## 2021-06-13 LAB — RESP PANEL BY RT-PCR (FLU A&B, COVID) ARPGX2
Influenza A by PCR: NEGATIVE
Influenza B by PCR: NEGATIVE
SARS Coronavirus 2 by RT PCR: NEGATIVE

## 2021-06-13 LAB — URINALYSIS, ROUTINE W REFLEX MICROSCOPIC
Bilirubin Urine: NEGATIVE
Glucose, UA: NEGATIVE mg/dL
Hgb urine dipstick: NEGATIVE
Ketones, ur: NEGATIVE mg/dL
Leukocytes,Ua: NEGATIVE
Nitrite: NEGATIVE
Protein, ur: 30 mg/dL — AB
Specific Gravity, Urine: 1.02 (ref 1.005–1.030)
pH: 7 (ref 5.0–8.0)

## 2021-06-13 LAB — CYCLIC CITRUL PEPTIDE ANTIBODY, IGG/IGA: CCP Antibodies IgG/IgA: <1 units (ref 0–19)

## 2021-06-13 MED ORDER — ONDANSETRON HCL 4 MG/2ML IJ SOLN
4.0000 mg | Freq: Once | INTRAMUSCULAR | Status: AC
  Administered 2021-06-13: 4 mg via INTRAVENOUS
  Filled 2021-06-13: qty 2

## 2021-06-13 MED ORDER — OXYCODONE-ACETAMINOPHEN 5-325 MG PO TABS
1.0000 | ORAL_TABLET | Freq: Four times a day (QID) | ORAL | 0 refills | Status: DC | PRN
Start: 2021-06-13 — End: 2021-11-17

## 2021-06-13 MED ORDER — SODIUM CHLORIDE 0.9 % IV BOLUS
500.0000 mL | Freq: Once | INTRAVENOUS | Status: AC
  Administered 2021-06-13: 500 mL via INTRAVENOUS

## 2021-06-13 MED ORDER — HYDROMORPHONE HCL 1 MG/ML IJ SOLN
1.0000 mg | Freq: Once | INTRAMUSCULAR | Status: AC
  Administered 2021-06-13: 1 mg via INTRAVENOUS
  Filled 2021-06-13: qty 1

## 2021-06-13 MED ORDER — IOHEXOL 350 MG/ML SOLN
100.0000 mL | Freq: Once | INTRAVENOUS | Status: AC | PRN
  Administered 2021-06-13: 100 mL via INTRAVENOUS

## 2021-06-13 NOTE — ED Provider Notes (Signed)
Emergency Department Provider Note   I have reviewed the triage vital signs and the nursing notes.   HISTORY  Chief Complaint Abdominal Pain   HPI Diane Wells is a 41 y.o. female with PMH reviewed including recent admit for neutropenic fever s/p abx course and Granix presents to the ED with sharp, pleuritic LL chest pain.  She really saw her endocrinologist today in the office was found to have low oxygen (83% RA) there although at rest here with normal O2. No prior history of PE. No leg pain or swelling. Pain worsening over the last 36 hours.    Past Medical History:  Diagnosis Date   Allergic rhinitis    Asthma    Chronic kidney disease    Granulomatosis with polyangiitis (HCC)    Hypertension    Recurrent upper respiratory infection (URI)     Review of Systems  Constitutional: No fever/chills Eyes: No visual changes. ENT: No sore throat. Cardiovascular: Positive chest pain. Respiratory: Denies shortness of breath. Gastrointestinal: No abdominal pain.  No nausea, no vomiting.  No diarrhea.  No constipation. Genitourinary: Negative for dysuria. Musculoskeletal: Negative for back pain. Skin: Negative for rash. Neurological: Negative for headaches, focal weakness or numbness.   ____________________________________________   PHYSICAL EXAM:  VITAL SIGNS: ED Triage Vitals  Enc Vitals Group     BP 06/13/21 1537 (!) 153/99     Pulse Rate 06/13/21 1222 95     Resp 06/13/21 1222 (!) 28     Temp 06/13/21 1222 98.1 F (36.7 C)     Temp Source 06/13/21 1222 Oral     SpO2 06/13/21 1222 98 %     Weight 06/13/21 1222 295 lb (133.8 kg)     Height 06/13/21 1222 5\' 7"  (1.702 m)   Constitutional: Alert and oriented. Well appearing and in no acute distress. Eyes: Conjunctivae are normal.  Head: Atraumatic. Nose: No congestion/rhinnorhea. Mouth/Throat: Mucous membranes are moist.  Neck: No stridor.  Cardiovascular: Normal rate, regular rhythm. Good peripheral  circulation. Grossly normal heart sounds.   Respiratory: Normal respiratory effort.  No retractions. Lungs CTAB. Gastrointestinal: Soft and nontender. No distention.  Musculoskeletal: No lower extremity tenderness nor edema. No gross deformities of extremities. Neurologic:  Normal speech and language. No gross focal neurologic deficits are appreciated.  Skin:  Skin is warm, dry and intact. No rash noted.   ____________________________________________   LABS (all labs ordered are listed, but only abnormal results are displayed)  Labs Reviewed  CBC WITH DIFFERENTIAL/PLATELET - Abnormal; Notable for the following components:      Result Value   WBC 44.0 (*)    Platelets 73 (*)    Neutro Abs 27.8 (*)    Lymphs Abs 12.3 (*)    Monocytes Absolute 1.3 (*)    Abs Immature Granulocytes 2.64 (*)    All other components within normal limits  COMPREHENSIVE METABOLIC PANEL - Abnormal; Notable for the following components:   Glucose, Bld 106 (*)    Creatinine, Ser 1.05 (*)    Calcium 8.3 (*)    Total Protein 6.2 (*)    Albumin 3.1 (*)    AST 63 (*)    All other components within normal limits  URINALYSIS, ROUTINE W REFLEX MICROSCOPIC - Abnormal; Notable for the following components:   APPearance HAZY (*)    Protein, ur 30 (*)    Bacteria, UA RARE (*)    Non Squamous Epithelial 0-5 (*)    All other components within normal limits  RESP PANEL BY RT-PCR (FLU A&B, COVID) [id number]   ____________________________________________  EKG   EKG Interpretation  Date/Time:  Monday June 13 2021 16:46:45 EDT Ventricular Rate:  86 PR Interval:  126 QRS Duration: 94 QT Interval:  406 QTC Calculation: 486 R Axis:   -10 Text Interpretation: Sinus rhythm Consider anterior infarct Confirmed by Alona Bene [id number]) on 06/14/2021 12:02:22 PM        ____________________________________________  RADIOLOGY  DG Ribs Unilateral W/Chest Left  Result Date: 06/13/2021 CLINICAL DATA:  focal rib cage  tenderness, lateral EXAM: LEFT RIBS AND CHEST - 3+ VIEW COMPARISON:  None. FINDINGS: Cardiomediastinal silhouette is within normal limits. Left basilar scarring/linear atelectasis. There is no focal airspace consolidation. There is no large pleural effusion. There is no visible pneumothorax. Cervical spine fusion hardware noted. IMPRESSION: No evidence of displaced rib fracture. No acute cardiopulmonary disease. Electronically Signed   By: Caprice Renshaw M.D.   On: 06/13/2021 13:07   CT Angio Chest PE W and/or Wo Contrast  Result Date: 06/13/2021 CLINICAL DATA:  Pulmonary embolism (PE) suspected, high prob. left-sided flank/chest pain that started approximately 2 days ago. Patient was recently hospitalized earlier this month for fever and neutropenia. EXAM: CT ANGIOGRAPHY CHEST WITH CONTRAST TECHNIQUE: Multidetector CT imaging of the chest was performed using the standard protocol during bolus administration of intravenous contrast. Multiplanar CT image reconstructions and MIPs were obtained to evaluate the vascular anatomy. RADIATION DOSE REDUCTION: This exam was performed according to the departmental dose-optimization program which includes automated exposure control, adjustment of the mA and/or kV according to patient size and/or use of iterative reconstruction technique. CONTRAST:  OMNIPAQUE IOHEXOL 350 MG/ML SOLN COMPARISON:  None. FINDINGS: Cardiovascular: Satisfactory opacification of the pulmonary arteries to the segmental level. No evidence of pulmonary embolism. Limited evaluation at the subsegmental level due to respiratory motion artifact. Normal heart size. No significant pericardial effusion. The thoracic aorta is normal in caliber. No atherosclerotic plaque of the thoracic aorta. No coronary artery calcifications. Mediastinum/Nodes: No enlarged mediastinal, hilar, or axillary lymph nodes. Thyroid gland, trachea, and esophagus demonstrate no significant findings. Lungs/Pleura: Linear  atelectasis versus scarring within the right lower lobe. Wedge shaped left lower lobe density likely representing atelectasis with no definite finding of pulmonary infarction. No focal consolidation. No pulmonary nodule. No pulmonary mass. Bilateral trace pleural effusions. No pneumothorax. Upper Abdomen: Splenomegaly measuring up to 17 cm on axial imaging. Musculoskeletal: No chest wall abnormality. No suspicious lytic or blastic osseous lesions. No acute displaced fracture. Review of the MIP images confirms the above findings. IMPRESSION: 1. No central or segment pulmonary embolus. Limited evaluation of subsegmental level due to respiratory motion artifact. 2. Trace bilateral pleural effusions. 3. Bibasilar atelectasis. 4. Splenomegaly. Underlying lymphoproliferative disorder not excluded. Electronically Signed   By: Tish Frederickson M.D.   On: 06/13/2021 18:18    ____________________________________________   PROCEDURES  Procedure(s) performed:   Procedures  None ____________________________________________   INITIAL IMPRESSION / ASSESSMENT AND PLAN / ED COURSE  Pertinent labs & imaging results that were available during my care of the patient were reviewed by me and considered in my medical decision making (see chart for details).   This patient is Presenting for Evaluation of CP, which does require a range of treatment options, and is a complaint that involves a high risk of morbidity and mortality.  The Differential Diagnoses includes all life-threatening causes for chest pain. This includes but is not exclusive to acute coronary syndrome, aortic dissection, pulmonary embolism, cardiac tamponade,  community-acquired pneumonia, pericarditis, musculoskeletal chest wall pain, etc.   Critical Interventions-    Medications  sodium chloride 0.9 % bolus 500 mL (0 mLs Intravenous Stopped 06/13/21 2005)  HYDROmorphone (DILAUDID) injection 1 mg (1 mg Intravenous Given 06/13/21 1729)  ondansetron  (ZOFRAN) injection 4 mg (4 mg Intravenous Given 06/13/21 1728)  iohexol (OMNIPAQUE) 350 MG/ML injection 100 mL (100 mLs Intravenous Contrast Given 06/13/21 1746)    Reassessment after intervention:  Pain improved.    I decided to review pertinent External Data, and in summary patient with recent admit for neutropenic fever.   Clinical Laboratory Tests Ordered, included patient with significant leukocytosis to 44.  No acute kidney injury.  No electrolyte disturbance.   Radiologic Tests Ordered, included CXR and CTA PE. I independently interpreted the images and agree with radiology interpretation.   Cardiac Monitor Tracing which shows NSR   Social Determinants of Health Risk patient with no smoking history.   Consult complete with Oncology.   Dr. Al Pimple with Oncology. Reviewed the CBC results. No source of infection. Suspect this is related to Granix given during the recent hospitalization. She will help coordinate follow up with the patient's oncologist.   Medical Decision Making: Summary:  Patient presents to the emergency department with left lower chest pain which is described as pleuritic.  Her vital signs are within normal limits including oxygen at rest.  Report of hypoxemia at the PCP office but not appreciated here.  Patient is high risk for PE given her ongoing cancer, recent hospitalization, recent procedure for bone marrow biopsy.  Plan for CTA PE and reassess.   Reevaluation with update and discussion with patient.  Discussed the CT findings.  No large PE noted.  Pain possibly related to the bone marrow stimulating medication, Granix.  Plan for additional pain medication to be sent to the pharmacy.  Patient has follow-up on Friday with her oncology team.  Discussed strict ED return precautions.  Disposition: discharge  ____________________________________________  FINAL CLINICAL IMPRESSION(S) / ED DIAGNOSES  Final diagnoses:  Pleurodynia    Note:  This document was  prepared using Dragon voice recognition software and may include unintentional dictation errors.  Alona Bene, MD, [facility] Emergency Medicine    Corinthia Helmers, Arlyss Repress, MD 06/14/21 [phone]

## 2021-06-13 NOTE — ED Notes (Signed)
O2 stable during ambulation. 96-98 ?

## 2021-06-13 NOTE — ED Triage Notes (Signed)
Pt presents with c/o upper left quad abdominal pain. Pt reports she was seen at her endocrinologist today and advised to come here for low O2. Pt reports her O2 was 83% on RA at their office. Pt reports she was recently d/c'd from the hospital after a bone marrow aspiration. Pt c/o blood clot in her lung.  ?

## 2021-06-13 NOTE — ED Provider Triage Note (Signed)
Emergency Medicine Provider Triage Evaluation Note ? ?Diane Wells , a 41 y.o. female  was evaluated in triage.  Pt complains of left-sided flank/chest pain that started approximately 2 days ago.  Patient was recently hospitalized earlier this month for fever and neutropenia.  She had a bone marrow biopsy done 3 days ago and results have not yet returned.  Patient states she had an appointment with her endocrinologist earlier today and was found to have an oxygen of 83% was referred here to the emergency department.  She is currently not feeling short of breath and is not needing oxygen requirement.  Patient states pain feels sharp, like a knife that is worsened with deep breathing.  She denies urinary symptoms, cough, congestion. ? ?She had a CT done last week without evidence of kidney stone. ?Review of Systems  ?Positive: Left flank pain ?Negative: Fevers, urinary symptoms, cough, congestion ? ?Physical Exam  ?Pulse 95   Temp 98.1 ?F (36.7 ?C) (Oral)   Resp (!) 28   Ht '5\' 7"'  (1.702 m)   Wt 133.8 kg   LMP 05/31/2021   SpO2 98%   BMI 46.20 kg/m?  ?Gen:   Awake, no distress   ?Resp:  Normal effort lungs CTA bilaterally ?MSK:   Moves extremities without difficulty.  Focal tenderness of the left lateral rib cage ?Other:  Patient is nontachycardic ? ?Medical Decision Making  ?Medically screening exam initiated at 1:02 PM.  Appropriate orders placed.  SCARLETT PORTLOCK was informed that the remainder of the evaluation will be completed by another provider, this initial triage assessment does not replace that evaluation, and the importance of remaining in the ED until their evaluation is complete. ? ? ?  ?Tonye Pearson, [place] ?06/13/21 1305 ? ?

## 2021-06-13 NOTE — Discharge Instructions (Signed)
You were seen in the emergency room today with chest discomfort.  Here lab work shows a significant elevation in your white blood cell count, likely from your medications.  Your CT scan did not show any sign of pneumonia or blood clot in your lungs.  If you develop fever you should return to the emergency department, otherwise please take your home medications and follow with your oncology team in the coming week.  ?

## 2021-06-14 ENCOUNTER — Encounter (HOSPITAL_COMMUNITY): Payer: Self-pay

## 2021-06-14 LAB — SURGICAL PATHOLOGY

## 2021-06-15 ENCOUNTER — Inpatient Hospital Stay: Payer: BC Managed Care – PPO

## 2021-06-15 ENCOUNTER — Other Ambulatory Visit: Payer: Self-pay

## 2021-06-15 ENCOUNTER — Telehealth: Payer: Self-pay

## 2021-06-15 ENCOUNTER — Encounter: Payer: Self-pay | Admitting: Hematology

## 2021-06-15 ENCOUNTER — Inpatient Hospital Stay: Payer: BC Managed Care – PPO | Attending: Hematology | Admitting: Hematology

## 2021-06-15 DIAGNOSIS — R6510 Systemic inflammatory response syndrome (SIRS) of non-infectious origin without acute organ dysfunction: Secondary | ICD-10-CM

## 2021-06-15 DIAGNOSIS — D709 Neutropenia, unspecified: Secondary | ICD-10-CM | POA: Insufficient documentation

## 2021-06-15 DIAGNOSIS — I7782 Antineutrophilic cytoplasmic antibody (ANCA) vasculitis: Secondary | ICD-10-CM

## 2021-06-15 DIAGNOSIS — M069 Rheumatoid arthritis, unspecified: Secondary | ICD-10-CM | POA: Insufficient documentation

## 2021-06-15 DIAGNOSIS — N189 Chronic kidney disease, unspecified: Secondary | ICD-10-CM | POA: Insufficient documentation

## 2021-06-15 DIAGNOSIS — Z79899 Other long term (current) drug therapy: Secondary | ICD-10-CM | POA: Insufficient documentation

## 2021-06-15 DIAGNOSIS — D703 Neutropenia due to infection: Secondary | ICD-10-CM

## 2021-06-15 LAB — CBC WITH DIFFERENTIAL (CANCER CENTER ONLY)
Abs Immature Granulocytes: 4.06 10*3/uL — ABNORMAL HIGH (ref 0.00–0.07)
Basophils Absolute: 0 10*3/uL (ref 0.0–0.1)
Basophils Relative: 0 %
Eosinophils Absolute: 0 10*3/uL (ref 0.0–0.5)
Eosinophils Relative: 0 %
HCT: 37.5 % (ref 36.0–46.0)
Hemoglobin: 12 g/dL (ref 12.0–15.0)
Immature Granulocytes: 18 %
Lymphocytes Relative: 22 %
Lymphs Abs: 4.8 10*3/uL — ABNORMAL HIGH (ref 0.7–4.0)
MCH: 27 pg (ref 26.0–34.0)
MCHC: 32 g/dL (ref 30.0–36.0)
MCV: 84.3 fL (ref 80.0–100.0)
Monocytes Absolute: 1.3 10*3/uL — ABNORMAL HIGH (ref 0.1–1.0)
Monocytes Relative: 6 %
Neutro Abs: 12.2 10*3/uL — ABNORMAL HIGH (ref 1.7–7.7)
Neutrophils Relative %: 54 %
Platelet Count: 101 10*3/uL — ABNORMAL LOW (ref 150–400)
RBC: 4.45 MIL/uL (ref 3.87–5.11)
RDW: 14.6 % (ref 11.5–15.5)
Smear Review: NORMAL
WBC Count: 22.5 10*3/uL — ABNORMAL HIGH (ref 4.0–10.5)
nRBC: 0.2 % (ref 0.0–0.2)

## 2021-06-15 LAB — CMP (CANCER CENTER ONLY)
ALT: 26 U/L (ref 0–44)
AST: 28 U/L (ref 15–41)
Albumin: 3.3 g/dL — ABNORMAL LOW (ref 3.5–5.0)
Alkaline Phosphatase: 69 U/L (ref 38–126)
Anion gap: 9 (ref 5–15)
BUN: 14 mg/dL (ref 6–20)
CO2: 25 mmol/L (ref 22–32)
Calcium: 8.2 mg/dL — ABNORMAL LOW (ref 8.9–10.3)
Chloride: 102 mmol/L (ref 98–111)
Creatinine: 0.99 mg/dL (ref 0.44–1.00)
GFR, Estimated: >60 mL/min (ref >60)
Glucose, Bld: 89 mg/dL (ref 70–99)
Potassium: 4.4 mmol/L (ref 3.5–5.1)
Sodium: 136 mmol/L (ref 135–145)
Total Bilirubin: 0.2 mg/dL — ABNORMAL LOW (ref 0.3–1.2)
Total Protein: 6.7 g/dL (ref 6.5–8.1)

## 2021-06-15 NOTE — Progress Notes (Signed)
?[name]   ?[phone] [phone]   ?Clinic Follow up Note  ? ?Patient Care Team: ?Copland, Gay Filler, MD as PCP - General (Family Medicine) ? ?Date of Service:  06/15/2021 ? ?CHIEF COMPLAINT: f/u of neutropenia ? ?CURRENT THERAPY:  ?Surveillance ? ?ASSESSMENT & PLAN:  ?Diane Wells is a 41 y.o. female with  ? ?1. Severe neutropenia, auto-immune related or infection induced, resolved  ?-presented to ED on 06/03/21 with fever, mouth sores, enlarged right neck lymph nodes. Labs showed WBC 1.8, neutrophil 0. ?-CT CAP 06/04/21 was unremarkable, no adenopathy or evidence of malignancy ?-bone marrow biopsy on 06/10/21 were unremarkable except a left shift, which is likely reactive ?-she was treated with Granix daily and antibiotics while admitted, then one dose Neulasta prior to discharge. ?-counts have recovered well-- WBC 22.5 and neutrophil 12.2 today. ?-I discussed that her episode of severe neutropenia was likely related to her underlying autoimmune disease, possibly induced by infection.  ? ?2. Rheumatoid Arthritis and granulomatosis with polyangiitis ?-she has received multiple immunosuppressant, including steroids, Humira, Rituxan in the past, and has been on CellCept and Plaquenil for over a year ?-was on maintenance CellCept 1500 mg BID prior to admission, which has been held since her recent hospital admission for fever ?-has not yet restarted treatment.  She will follow-up with Dr. Trudie Reed ? ? ?PLAN: ?-I reviewed her lab and bone marrow biopsy results, her neutropenia has resolved.  No additional treatment needed ?-I will see her as needed ?-She will follow-up with her rheumatologist Dr. Trudie Reed ? ? ?No problem-specific Assessment & Plan notes found for this encounter. ? ? ?INTERVAL HISTORY:  ?Diane Wells is here for a follow up of neutropenia. She was last seen by me on 06/09/21 while she was in the hospital. She presents to the clinic alone. ?She reports she is doing  better overall.  ?  ?All other systems were reviewed with the patient and are negative. ? ?MEDICAL HISTORY:  ?Past Medical History:  ?Diagnosis Date  ? Allergic rhinitis   ? Asthma   ? Chronic kidney disease   ? Granulomatosis with polyangiitis ([place])   ? Hypertension   ? Recurrent upper respiratory infection (URI)   ? ? ?SURGICAL HISTORY: ?Past Surgical History:  ?Procedure Laterality Date  ? ADENOIDECTOMY    ? ANTERIOR CERVICAL DECOMP/DISCECTOMY FUSION    ? ? ?I have reviewed the social history and family history with the patient and they are unchanged from previous note. ? ?ALLERGIES:  is allergic to albuterol, other, truxima [rituximab], sulfa antibiotics, and vancomycin. ? ?MEDICATIONS:  ?Current Outpatient Medications  ?Medication Sig Dispense Refill  ? budesonide-formoterol (SYMBICORT) 160-4.5 MCG/ACT inhaler Inhale 2 puffs into the lungs in the morning and at bedtime. 1 each 5  ? butalbital-acetaminophen-caffeine (FIORICET) 50-325-40 MG tablet Take 1 tablet by mouth every 6 (six) hours as needed for headache. (Patient taking differently: Take 1 tablet by mouth every 6 (six) hours as needed (severe tension headache).) 60 tablet 1  ? chlorhexidine (PERIDEX) 0.12 % solution Use as directed 15 mLs in the mouth or throat 2 (two) times daily. 120 mL 0  ? EPINEPHrine 0.3 mg/0.3 mL IJ SOAJ injection Inject 0.3 mg into the muscle once as needed for anaphylaxis.    ? fexofenadine (ALLEGRA) 180 MG tablet Take 180 mg by mouth every morning.    ? fluticasone (FLONASE) 50 MCG/ACT nasal spray Place 2 sprays into both nostrils in the morning and at bedtime. 16 g 11  ?  furosemide (LASIX) 20 MG tablet TAKE 1 (ONE) TABLET DAILY AS NEEDED (Patient taking differently: Take 20-40 mg by mouth daily as needed (swelling).) 90 tablet 0  ? gabapentin (NEURONTIN) 300 MG capsule TAKE 1 CAPSULE BY MOUTH THREE TIMES A DAY (Patient taking differently: Take 300 mg by mouth See admin instructions. Take one capsule (300 mg) by mouth twice  daily, may also take one capsule (300 mg) midday as needed for pain) 90 capsule 5  ? hydrocortisone (CORTEF) 10 MG tablet 10 mg Am and 5 mg PM. (Patient not taking: Reported on 06/04/2021)    ? hydroxychloroquine (PLAQUENIL) 200 MG tablet Take 1 tablet (200 mg total) by mouth 2 (two) times daily. Hold until ok with your rheumatologist    ? ketorolac (ACULAR) 0.5 % ophthalmic solution 1 drop See admin instructions. Apply one drop into left eye daily at bedtime, may also use up to three more times daily in affected eye(s) as needed for scleritis    ? losartan (COZAAR) 25 MG tablet TAKE 3 TABLETS BY MOUTH DAILY (Patient taking differently: Take 75 mg by mouth at bedtime.) 270 tablet 1  ? magic mouthwash w/lidocaine SOLN Take 5 mLs by mouth 4 (four) times daily as needed for mouth pain. Swish and Spit 240 mL 0  ? methocarbamol (ROBAXIN) 750 MG tablet TAKE 1 TABLET (750 MG TOTAL) BY MOUTH EVERY 8 (EIGHT) HOURS AS NEEDED FOR MUSCLE SPASMS. (Patient taking differently: Take 750 mg by mouth See admin instructions. Take one tablet (750 mg) by mouth twice daily, may also take one tablet (750 mg) midday as needed for muscle spasms/pain) 90 tablet 3  ? mycophenolate (CELLCEPT) 500 MG tablet Take 3 tablets (1,500 mg total) by mouth 2 (two) times daily. Hold this meds, resume when ok with your rheumatologist    ? ondansetron (ZOFRAN) 8 MG tablet Take 1 tablet (8 mg total) by mouth every 8 (eight) hours as needed for nausea or vomiting. 30 tablet 2  ? oxyCODONE-acetaminophen (PERCOCET/ROXICET) 5-325 MG tablet Take 1 tablet by mouth every 6 (six) hours as needed for severe pain. 12 tablet 0  ? PROAIR HFA 108 (90 Base) MCG/ACT inhaler Inhale 1-2 puffs into the lungs daily as needed for shortness of breath. 18 g 5  ? simethicone (MYLICON) 80 MG chewable tablet Chew 1 tablet (80 mg total) by mouth 4 (four) times daily as needed for flatulence. 30 tablet 0  ? Vitamin D3 (VITAMIN D) 25 MCG tablet Take 1,000 Units by mouth every morning.     ? ?No current facility-administered medications for this visit.  ? ? ?PHYSICAL EXAMINATION: ?ECOG PERFORMANCE STATUS: 2 - Symptomatic, <50% confined to bed ? ?Vitals:  ? 06/15/21 1528  ?BP: (!) 145/98  ?Pulse: 89  ?Resp: 20  ?Temp: 98.2 ?F (36.8 ?C)  ?SpO2: 99%  ? ?Wt Readings from Last 3 Encounters:  ?06/15/21 299 lb 3.2 oz (135.7 kg)  ?06/13/21 295 lb (133.8 kg)  ?06/03/21 295 lb (133.8 kg)  ?  ? ?GENERAL:alert, no distress and comfortable ?SKIN: skin color normal, no rashes or significant lesions ?EYES: normal, Conjunctiva are pink and non-injected, sclera clear  ?NEURO: alert & oriented x 3 with fluent speech ? ?LABORATORY DATA:  ?I have reviewed the data as listed ? ?  Latest Ref Rng & Units 06/15/2021  ?  3:04 PM 06/13/2021  ? 12:50 PM 06/10/2021  ?  3:18 AM  ?CBC  ?WBC 4.0 - 10.5 K/uL 22.5   44.0   4.2    ?  Hemoglobin 12.0 - 15.0 g/dL 12.0   12.5   12.4    ?Hematocrit 36.0 - 46.0 % 37.5   40.0   39.0    ?Platelets 150 - 400 K/uL 101   73   PLATELET CLUMPS NOTED ON SMEAR, UNABLE TO ESTIMATE    ? ? ? ? ?  Latest Ref Rng & Units 06/15/2021  ?  3:04 PM 06/13/2021  ? 12:50 PM 06/10/2021  ?  3:18 AM  ?CMP  ?Glucose 70 - 99 mg/dL 89   106   94    ?BUN 6 - 20 mg/dL _0 ?Creatinine 0.44 - 1.00 mg/dL 0.99   1.05   0.99    ?Sodium 135 - 145 mmol/L 136   135   135    ?Potassium 3.5 - 5.1 mmol/L 4.4   5.0   4.1    ?Chloride 98 - 111 mmol/L 102   103   100    ?CO2 22 - 32 mmol/L _1 ?Calcium 8.9 - 10.3 mg/dL 8.2   8.3   7.9    ?Total Protein 6.5 - 8.1 g/dL 6.7   6.2     ?Total Bilirubin 0.3 - 1.2 mg/dL 0.2   1.2     ?Alkaline Phos 38 - 126 U/L 69   86     ?AST 15 - 41 U/L 28   63     ?ALT 0 - 44 U/L 26   33     ? ? ? ? ?RADIOGRAPHIC STUDIES: ?I have personally reviewed the radiological images as listed and agreed with the findings in the report. ?No results found.  ? ? ?No orders of the defined types were placed in this encounter. ? ?All questions were answered. The patient knows to call the clinic with  any problems, questions or concerns. No barriers to learning was detected. ?The total time spent in the appointment was 25 minutes. ? ?  ? Truitt Merle, MD ?06/15/2021  ? ?I, Wilburn Mylar, am acting as s

## 2021-06-15 NOTE — Telephone Encounter (Signed)
This nurse reached out to patient per provider related to moving appointment from 3/23 to 3/22.  No answer, this nurse left a message for patient to return call to clinic and ask for this nurse.  No further concerns at this time.   ?

## 2021-06-15 NOTE — Telephone Encounter (Signed)
This nurse received call from patient stating she was in agreement with moving her office visit and labs from 3/23 to 3/22.  No further questions or concerns at this time.   ?

## 2021-06-16 ENCOUNTER — Inpatient Hospital Stay: Payer: BC Managed Care – PPO | Admitting: Hematology

## 2021-06-16 ENCOUNTER — Other Ambulatory Visit: Payer: BC Managed Care – PPO

## 2021-06-17 ENCOUNTER — Encounter (HOSPITAL_COMMUNITY): Payer: Self-pay

## 2021-06-17 ENCOUNTER — Ambulatory Visit: Payer: BC Managed Care – PPO | Admitting: Hematology

## 2021-06-17 ENCOUNTER — Other Ambulatory Visit: Payer: BC Managed Care – PPO

## 2021-06-21 DIAGNOSIS — E041 Nontoxic single thyroid nodule: Secondary | ICD-10-CM | POA: Diagnosis not present

## 2021-06-21 DIAGNOSIS — E242 Drug-induced Cushing's syndrome: Secondary | ICD-10-CM | POA: Diagnosis not present

## 2021-06-21 DIAGNOSIS — R9389 Abnormal findings on diagnostic imaging of other specified body structures: Secondary | ICD-10-CM | POA: Diagnosis not present

## 2021-06-21 DIAGNOSIS — E2749 Other adrenocortical insufficiency: Secondary | ICD-10-CM | POA: Diagnosis not present

## 2021-06-23 DIAGNOSIS — R809 Proteinuria, unspecified: Secondary | ICD-10-CM | POA: Diagnosis not present

## 2021-06-23 DIAGNOSIS — H15009 Unspecified scleritis, unspecified eye: Secondary | ICD-10-CM | POA: Diagnosis not present

## 2021-06-23 DIAGNOSIS — M3130 Wegener's granulomatosis without renal involvement: Secondary | ICD-10-CM | POA: Diagnosis not present

## 2021-06-23 DIAGNOSIS — M0579 Rheumatoid arthritis with rheumatoid factor of multiple sites without organ or systems involvement: Secondary | ICD-10-CM | POA: Diagnosis not present

## 2021-07-12 ENCOUNTER — Other Ambulatory Visit: Payer: Self-pay | Admitting: Family Medicine

## 2021-07-12 DIAGNOSIS — N6310 Unspecified lump in the right breast, unspecified quadrant: Secondary | ICD-10-CM

## 2021-07-15 ENCOUNTER — Other Ambulatory Visit: Payer: BC Managed Care – PPO

## 2021-07-15 ENCOUNTER — Ambulatory Visit
Admission: RE | Admit: 2021-07-15 | Discharge: 2021-07-15 | Disposition: A | Discharge status: Home / Self Care | Payer: BC Managed Care – PPO | Source: Ambulatory Visit | Attending: Family Medicine | Admitting: Family Medicine

## 2021-07-15 DIAGNOSIS — N6310 Unspecified lump in the right breast, unspecified quadrant: Secondary | ICD-10-CM

## 2021-07-15 DIAGNOSIS — N6311 Unspecified lump in the right breast, upper outer quadrant: Secondary | ICD-10-CM | POA: Diagnosis not present

## 2021-07-21 DIAGNOSIS — M3130 Wegener's granulomatosis without renal involvement: Secondary | ICD-10-CM | POA: Diagnosis not present

## 2021-07-22 DIAGNOSIS — E041 Nontoxic single thyroid nodule: Secondary | ICD-10-CM | POA: Diagnosis not present

## 2021-07-22 DIAGNOSIS — E242 Drug-induced Cushing's syndrome: Secondary | ICD-10-CM | POA: Diagnosis not present

## 2021-07-22 DIAGNOSIS — E2749 Other adrenocortical insufficiency: Secondary | ICD-10-CM | POA: Diagnosis not present

## 2021-07-25 ENCOUNTER — Other Ambulatory Visit: Payer: Self-pay | Admitting: Family Medicine

## 2021-07-25 ENCOUNTER — Encounter: Payer: Self-pay | Admitting: Family Medicine

## 2021-07-25 DIAGNOSIS — G43009 Migraine without aura, not intractable, without status migrainosus: Secondary | ICD-10-CM

## 2021-07-25 MED ORDER — BUTALBITAL-APAP-CAFFEINE 50-325-40 MG PO TABS: ORAL_TABLET | ORAL | 0 refills | Status: DC

## 2021-07-25 NOTE — Telephone Encounter (Signed)
Please Refill printed by mistake ?

## 2021-07-25 NOTE — Addendum Note (Signed)
Addended by: Kathi Ludwig on: 07/25/2021 10:14 AM ? ? Modules accepted: Orders ? ?

## 2021-07-25 NOTE — Addendum Note (Signed)
Addended by: Lamar Blinks C on: 07/25/2021 01:06 PM ? ? Modules accepted: Orders ? ?

## 2021-08-01 ENCOUNTER — Ambulatory Visit (INDEPENDENT_AMBULATORY_CARE_PROVIDER_SITE_OTHER): Payer: BC Managed Care – PPO | Admitting: Family Medicine

## 2021-08-01 ENCOUNTER — Encounter: Payer: Self-pay | Admitting: Family Medicine

## 2021-08-01 VITALS — BP 128/80 | HR 64 | Temp 97.6°F | Ht 67.0 in | Wt 296.5 lb

## 2021-08-01 DIAGNOSIS — B0230 Zoster ocular disease, unspecified: Secondary | ICD-10-CM

## 2021-08-01 MED ORDER — VALACYCLOVIR HCL 1 G PO TABS: 1000.0000 mg | ORAL_TABLET | Freq: Three times a day (TID) | ORAL | 0 refills | Status: AC

## 2021-08-01 MED ORDER — VENLAFAXINE HCL ER 37.5 MG PO CP24: 37.5000 mg | ORAL_CAPSULE | Freq: Every day | ORAL | 0 refills | Status: DC

## 2021-08-01 MED ORDER — PREDNISOLONE ACETATE 1 % OP SUSP: 1.0000 [drp] | Freq: Four times a day (QID) | OPHTHALMIC | 0 refills | Status: DC

## 2021-08-01 NOTE — Patient Instructions (Addendum)
Send me a message today if you cannot get in with your eye doctor in the next couple eyes.  ? ?Hold the Cellcept for a week. Continue other meds.  ? ?Ice/cold pack over area for 10-15 min twice daily. ? ?OK to take Tylenol 1000 mg (2 extra strength tabs) or 975 mg (3 regular strength tabs) every 6 hours as needed. ? ?Let us know if you need anything. ?

## 2021-08-01 NOTE — Progress Notes (Signed)
Chief Complaint  ?Patient presents with  ? Rash  ?  Thinks may be shingles ?  ? ? ?Diane Wells is a 41 y.o. female here for a skin complaint. ? ?Duration: 4 days ?Location: forehead ?Pruritic? Yes; initially ?Painful? Yes; burning ?Drainage? No ?New soaps/lotions/topicals/detergents? No ?Sick contacts? No ?Other associated symptoms: Pain in the right eye ?Therapies tried thus far: gabapentin; she has a history of adrenal insufficiency and takes hydrocortisone daily which she has increased due to recent illness. ? ?Past Medical History:  ?Diagnosis Date  ? Allergic rhinitis   ? Asthma   ? Chronic kidney disease   ? Granulomatosis with polyangiitis (HCC)   ? Hypertension   ? Recurrent upper respiratory infection (URI)   ? ? ?BP 128/80   Pulse 64   Temp 97.6 ?F (36.4 ?C) (Oral)   Ht 5\' 7"  (1.702 m)   Wt 296 lb 8 oz (134.5 kg)   SpO2 99%   BMI 46.44 kg/m?  ?Gen: awake, alert, appearing stated age ?Lungs: No accessory muscle use ?Skin: See below.  ?Eyes: PERRLA, extraocular muscles intact grossly, sclera white ?Psych: Age appropriate judgment and insight ? ? ? ? ? ? ?Herpes zoster with ophthalmic complication, unspecified herpes zoster eye disease - Plan: prednisoLONE acetate (PRED FORTE) 1 % ophthalmic suspension, valACYclovir (VALTREX) 1000 MG tablet, venlafaxine XR (EFFEXOR XR) 37.5 MG 24 hr capsule ? ?7 days of Valtrex 1000 mg 3 times daily, Effexor 37.5 mg daily for neuropathic pain that she is already on an oral steroid and gabapentin.  Eyedrops as above, she was instructed to contact her eye provider and schedule an appointment the next day or 2.  If she cannot book an appointment, she will let me know and we will place an urgent referral. ?F/u prn. ?The patient voiced understanding and agreement to the plan. ? ?Sharlene Dory, DO ?08/01/21 ?11:58 AM ? ?

## 2021-08-02 DIAGNOSIS — B029 Zoster without complications: Secondary | ICD-10-CM | POA: Diagnosis not present

## 2021-08-05 DIAGNOSIS — B029 Zoster without complications: Secondary | ICD-10-CM | POA: Diagnosis not present

## 2021-08-08 DIAGNOSIS — E2749 Other adrenocortical insufficiency: Secondary | ICD-10-CM | POA: Diagnosis not present

## 2021-08-09 DIAGNOSIS — B029 Zoster without complications: Secondary | ICD-10-CM | POA: Diagnosis not present

## 2021-08-09 DIAGNOSIS — H1045 Other chronic allergic conjunctivitis: Secondary | ICD-10-CM | POA: Diagnosis not present

## 2021-08-18 DIAGNOSIS — M0579 Rheumatoid arthritis with rheumatoid factor of multiple sites without organ or systems involvement: Secondary | ICD-10-CM | POA: Diagnosis not present

## 2021-08-18 DIAGNOSIS — H15009 Unspecified scleritis, unspecified eye: Secondary | ICD-10-CM | POA: Diagnosis not present

## 2021-08-18 DIAGNOSIS — M3130 Wegener's granulomatosis without renal involvement: Secondary | ICD-10-CM | POA: Diagnosis not present

## 2021-08-18 DIAGNOSIS — R809 Proteinuria, unspecified: Secondary | ICD-10-CM | POA: Diagnosis not present

## 2021-08-23 ENCOUNTER — Other Ambulatory Visit: Payer: Self-pay | Admitting: Family Medicine

## 2021-08-23 DIAGNOSIS — B0230 Zoster ocular disease, unspecified: Secondary | ICD-10-CM

## 2021-08-24 ENCOUNTER — Other Ambulatory Visit: Payer: Self-pay | Admitting: Family Medicine

## 2021-08-24 DIAGNOSIS — G5691 Unspecified mononeuropathy of right upper limb: Secondary | ICD-10-CM

## 2021-08-24 DIAGNOSIS — M5090 Cervical disc disorder, unspecified, unspecified cervical region: Secondary | ICD-10-CM

## 2021-08-24 DIAGNOSIS — M62838 Other muscle spasm: Secondary | ICD-10-CM

## 2021-08-29 DIAGNOSIS — E2749 Other adrenocortical insufficiency: Secondary | ICD-10-CM | POA: Diagnosis not present

## 2021-08-29 DIAGNOSIS — E242 Drug-induced Cushing's syndrome: Secondary | ICD-10-CM | POA: Diagnosis not present

## 2021-08-29 DIAGNOSIS — E041 Nontoxic single thyroid nodule: Secondary | ICD-10-CM | POA: Diagnosis not present

## 2021-09-02 ENCOUNTER — Other Ambulatory Visit: Payer: Self-pay | Admitting: Internal Medicine

## 2021-09-02 DIAGNOSIS — J4540 Moderate persistent asthma, uncomplicated: Secondary | ICD-10-CM

## 2021-09-05 ENCOUNTER — Other Ambulatory Visit: Payer: Self-pay | Admitting: Internal Medicine

## 2021-09-05 DIAGNOSIS — J4540 Moderate persistent asthma, uncomplicated: Secondary | ICD-10-CM

## 2021-09-06 ENCOUNTER — Other Ambulatory Visit: Payer: Self-pay | Admitting: Internal Medicine

## 2021-09-06 DIAGNOSIS — J4540 Moderate persistent asthma, uncomplicated: Secondary | ICD-10-CM

## 2021-10-18 DIAGNOSIS — M3130 Wegener's granulomatosis without renal involvement: Secondary | ICD-10-CM | POA: Diagnosis not present

## 2021-11-15 NOTE — Progress Notes (Signed)
Diane Wells at [facility] [address] [place], [place] [address] [phone]  Date:  11/17/2021   Name:  Diane Wells   DOB:  [birth year]   MRN:  [id number]  PCP:  Darreld Mclean, MD    Chief Complaint: Headache (Pt says they are worsening and becoming more frequent. She thinks they are related to the tension in her neck and has pain behind the R eye. /Concerns/ questions: med refills. /)   History of Present Illness:  Diane Wells is a 41 y.o. 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 Burr Medico in March after she was admitted with neutropenic fever earlier the same month 1. Severe neutropenia, auto-immune related or infection induced, resolved  -presented to ED on 06/03/21 with fever, mouth sores, enlarged right neck lymph nodes. Labs showed WBC 1.8, neutrophil 0. -CT CAP 06/04/21 was unremarkable, no adenopathy or evidence of malignancy -bone marrow biopsy on 06/10/21 were unremarkable except a left shift, which is likely reactive -she was treated with Granix daily and antibiotics while admitted, then one dose Neulasta prior to discharge. -counts have recovered well-- WBC 22.5 and neutrophil 12.2 today. -I discussed that her episode of severe neutropenia was likely related to her underlying autoimmune disease, possibly induced by infection.  2. Rheumatoid Arthritis and granulomatosis with polyangiitis -she has received multiple immunosuppressant, including steroids,  Humira, Rituxan in the past, and has been on CellCept and Plaquenil for over a year -was on maintenance CellCept 1500 mg BID prior to admission, which has been held since her recent hospital admission for fever -has not yet restarted treatment.  She will follow-up with Dr. Trudie Reed PLAN: -I reviewed her lab and bone marrow biopsy results, her neutropenia has resolved.  No additional treatment needed   Rheumatology- Dr Trudie Reed with [facility].  Note from May is reviewed Endocrinology- Buddy Duty at [facility]   Neurology- Jannifer Franklin, now retired.  Ophthalmology- at [facility] eye Dr Eulas Post  Nephrology-  Dr Johnney Ou at [facility], most recent visit in February Pulmonology - seen in March.  Dr Tawni Carnes- March of this year  Pt notes headaches "for years" but more frequent the last month or so- 4-5x a week the last month She may have to call out of work 1-2x a week for headaches which is not typical for her Ibuprofen helps a bit, but she tries to be judicious with this medication due to her kidney limitations She has also used fioricet which helps some- it used to work great and completely resolve her pain, this is no longer the case She thinks this may be related to a problem with her neck.  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  Most  recent eye exam a few months ago- she has an appt in about a month   Cervical spine films in 2007 She had a cervical disc fusion in 2016 per Dr Saintclair Halsted She last had a cervical MRI in 2017  Patient Active Problem List   Diagnosis Date Noted   Neutropenia ([place]) 06/15/2021   Cyst of ovary, right 06/05/2021   Left thyroid nodule 06/05/2021   Neutropenic fever  ([place]) 06/04/2021   Lymphadenopathy 06/03/2021   Mouth sores 06/03/2021   SIRS (systemic inflammatory response syndrome) ([place]) 06/03/2021   Essential (primary) hypertension 05/31/2021   Iatrogenic Cushing's syndrome ([place]) 05/31/2021   Secondary adrenal insufficiency ([place]) 05/31/2021   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   Granulomatosis with polyangiitis with pulmonary involvement ([place]) 05/27/2019   Medication management 05/27/2019   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   Patellar dislocation 01/17/2018   Acute left ankle pain 11/27/2017   Allergic reaction 02/15/2016   Moderate persistent asthma without complication 11/55/2080   Perennial and seasonal allergic rhinitis 02/15/2016   Allergic conjunctivitis 02/15/2016   Obesity, unspecified obesity severity, unspecified obesity type 01/10/2016    Past Medical History:  Diagnosis Date   Allergic rhinitis    Asthma    Chronic kidney disease    Granulomatosis with polyangiitis (HCC)    Hypertension    Recurrent upper respiratory infection (URI)     Past Surgical History:  Procedure Laterality Date   ADENOIDECTOMY     ANTERIOR CERVICAL DECOMP/DISCECTOMY FUSION      Social History   Tobacco Use   Smoking status: Never   Smokeless tobacco: Never  Vaping Use   Vaping Use: Never used  Substance Use Topics   Alcohol use: Yes    Comment: Rare   Drug use: No    Family History  Problem Relation Age of Onset   Diabetes Mother        Type 1 Diabetes, juvenile onset   Hypertension Mother    Pulmonary Hypertension Mother    Hyperthyroidism Mother    Glaucoma Mother    Neuropathy Mother    Sjogren's syndrome Mother    Carpal tunnel syndrome Mother    Hypertension Father    Gallbladder disease Father    Hypertension Maternal Grandmother    Glaucoma Maternal Grandmother    Coronary artery disease Maternal Grandmother    Heart attack Maternal Grandmother    Angina Maternal Grandmother    Macular degeneration  Maternal Grandmother    COPD Maternal Grandmother    Congestive Heart Failure Maternal Grandmother    Glaucoma Maternal Grandfather    Leukemia Paternal Grandmother    Hypertension Paternal Grandmother    Colon cancer Paternal Grandfather    Diabetes Maternal Uncle    Heart attack Maternal Uncle    Stroke Maternal Uncle    Allergic rhinitis Neg Hx    Angioedema Neg Hx    Asthma Neg Hx    Eczema Neg Hx     Allergies  Allergen Reactions   Albuterol Shortness Of Breath and Other (See Comments)    Tolerates HFA inhaler, nebulizer treatment caused O2 drop   Other Anaphylaxis    Essential Oil, scent unknown, caused tongue swelling   Truxima [Rituximab] Anaphylaxis   Sulfa Antibiotics Hives   Vancomycin Itching    Scalp itching - felt like it was on fire    Medication list has been reviewed and updated.  Current Outpatient Medications on File Prior to Visit  Medication Sig Dispense Refill   albuterol (VENTOLIN HFA)  108 (90 Base) MCG/ACT inhaler INHALE 1-2 PUFFS INTO THE LUNGS DAILY AS NEEDED FOR SHORTNESS OF BREATH. DAW 18 each 2   budesonide-formoterol (SYMBICORT) 160-4.5 MCG/ACT inhaler Inhale 2 puffs into the lungs in the morning and at bedtime. 1 each 5   butalbital-acetaminophen-caffeine (FIORICET) 50-325-40 MG tablet TAKE 1 TABLET BY MOUTH EVERY 6 HOURS AS NEEDED FOR HEADACHE. 60 tablet 0   EPINEPHrine 0.3 mg/0.3 mL IJ SOAJ injection Inject 0.3 mg into the muscle once as needed for anaphylaxis.     fexofenadine (ALLEGRA) 180 MG tablet Take 180 mg by mouth every morning.     furosemide (LASIX) 20 MG tablet TAKE 1 (ONE) TABLET DAILY AS NEEDED (Patient taking differently: Take 20-40 mg by mouth daily as needed (swelling).) 90 tablet 0   gabapentin (NEURONTIN) 300 MG capsule TAKE 1 CAPSULE BY MOUTH THREE TIMES A DAY (Patient taking differently: Take 300 mg by mouth See admin instructions. Take one capsule (300 mg) by mouth twice daily, may also take one capsule (300 mg) midday as  needed for pain) 90 capsule 5   hydrocortisone (CORTEF) 10 MG tablet 10 mg Am and 5 mg PM.     hydroxychloroquine (PLAQUENIL) 200 MG tablet Take 1 tablet (200 mg total) by mouth 2 (two) times daily. Hold until ok with your rheumatologist     ketorolac (ACULAR) 0.5 % ophthalmic solution 1 drop See admin instructions. Apply one drop into left eye daily at bedtime, may also use up to three more times daily in affected eye(s) as needed for scleritis     losartan (COZAAR) 25 MG tablet TAKE 3 TABLETS BY MOUTH DAILY (Patient taking differently: Take 75 mg by mouth at bedtime.) 270 tablet 1   methocarbamol (ROBAXIN) 750 MG tablet TAKE 1 TABLET (750 MG TOTAL) BY MOUTH EVERY 8 (EIGHT) HOURS AS NEEDED FOR MUSCLE SPASMS. 90 tablet 3   mycophenolate (CELLCEPT) 500 MG tablet Take 3 tablets (1,500 mg total) by mouth 2 (two) times daily. Hold this meds, resume when ok with your rheumatologist     ondansetron (ZOFRAN) 8 MG tablet Take 1 tablet (8 mg total) by mouth every 8 (eight) hours as needed for nausea or vomiting. 30 tablet 2   prednisoLONE acetate (PRED FORTE) 1 % ophthalmic suspension Place 1 drop into the right eye 4 (four) times daily. 5 mL 0   fluticasone (FLONASE) 50 MCG/ACT nasal spray Place 2 sprays into both nostrils in the morning and at bedtime. 16 g 11   venlafaxine XR (EFFEXOR-XR) 37.5 MG 24 hr capsule TAKE 1 CAPSULE BY MOUTH DAILY WITH BREAKFAST. (Patient not taking: Reported on 11/17/2021) 90 capsule 0   No current facility-administered medications on file prior to visit.    Review of Systems:  As per HPI- otherwise negative.   Physical Examination: Vitals:   11/17/21 1057  BP: 122/80  Pulse: 77  Resp: 18  Temp: 98.7 F (37.1 C)  SpO2: 99%   Vitals:   11/17/21 1057  Weight: (!) 304 lb 3.2 oz (138 kg)  Height: '5\' 7"'  (1.702 m)   Body mass index is 47.64 kg/m. Ideal Body Weight: Weight in (lb) to have BMI = 25: 159.3  GEN: no acute distress.  Obese, looks well  HEENT:  Atraumatic, Normocephalic. Bilateral TM wnl, oropharynx normal.  PEERL,EOMI. limited funduscopic exam is normal Ears and Nose: No external deformity. CV: RRR, No M/G/R. No JVD. No thrill. No extra heart sounds. PULM: CTA B, no wheezes, crackles, rhonchi. No retractions. No resp.  distress. No accessory muscle use. ABD: S, NT, ND, +BS. No rebound. No HSM. EXTR: No c/c/e PSYCH: Normally interactive. Conversant.  Reduced cervical ROM to the left, otherwise normal  Normal strength, sensation of all limbs.  Normal deep tendon reflex No meningismus Assessment and Plan: Chronic left-sided headaches - Plan: DG Cervical Spine Complete, SUMAtriptan (IMITREX) 100 MG tablet  Patient seen today with headaches.  She does have history of migraine but this seems different.  She suspects this may be related to cervical spine disease, she does have history of previous C-spine surgery Her headaches also have some feature of migraine We will obtain plain films of her C-spine today, we will have her try Imitrex to see if helpful Pending her C-spine films plan to proceed to an MRI of her cervical spine and her head as well if she would like She will look into FMLA She will let me know if anything is changing or getting worse in the meantime  Signed Lamar Blinks, MD Pt may want to apply for FMLA up to one day per week

## 2021-11-17 ENCOUNTER — Ambulatory Visit (INDEPENDENT_AMBULATORY_CARE_PROVIDER_SITE_OTHER): Payer: BC Managed Care – PPO | Admitting: Family Medicine

## 2021-11-17 ENCOUNTER — Ambulatory Visit (HOSPITAL_BASED_OUTPATIENT_CLINIC_OR_DEPARTMENT_OTHER)
Admission: RE | Admit: 2021-11-17 | Discharge: 2021-11-17 | Disposition: A | Discharge status: Home / Self Care | Payer: BC Managed Care – PPO | Source: Ambulatory Visit | Attending: Family Medicine | Admitting: Family Medicine

## 2021-11-17 VITALS — BP 122/80 | HR 77 | Temp 98.7°F | Resp 18 | Ht 67.0 in | Wt 304.2 lb

## 2021-11-17 DIAGNOSIS — G8929 Other chronic pain: Secondary | ICD-10-CM

## 2021-11-17 DIAGNOSIS — R519 Headache, unspecified: Secondary | ICD-10-CM | POA: Insufficient documentation

## 2021-11-17 DIAGNOSIS — M542 Cervicalgia: Secondary | ICD-10-CM | POA: Diagnosis not present

## 2021-11-17 MED ORDER — SUMATRIPTAN SUCCINATE 100 MG PO TABS: 100.0000 mg | ORAL_TABLET | ORAL | 0 refills | Status: DC | PRN

## 2021-11-17 NOTE — Patient Instructions (Signed)
Good to see you today but I am sorry you are having these headaches  X-ray of your neck today Try the imitrex as needed for headaches- let me know if helpful at all We can get an MRI of your neck- and your head as well if you like Please discuss FMLA with your employer to see if you qualify- if so I can complete these forms for you!

## 2021-11-18 ENCOUNTER — Encounter: Payer: Self-pay | Admitting: Family Medicine

## 2021-11-18 DIAGNOSIS — M5090 Cervical disc disorder, unspecified, unspecified cervical region: Secondary | ICD-10-CM

## 2021-11-18 DIAGNOSIS — G5691 Unspecified mononeuropathy of right upper limb: Secondary | ICD-10-CM

## 2021-11-18 MED ORDER — GABAPENTIN 300 MG PO CAPS: ORAL_CAPSULE | ORAL | 1 refills | Status: DC

## 2021-11-19 ENCOUNTER — Encounter: Payer: Self-pay | Admitting: Family Medicine

## 2021-11-19 DIAGNOSIS — G5691 Unspecified mononeuropathy of right upper limb: Secondary | ICD-10-CM

## 2021-11-19 DIAGNOSIS — G8929 Other chronic pain: Secondary | ICD-10-CM

## 2021-11-19 DIAGNOSIS — M5090 Cervical disc disorder, unspecified, unspecified cervical region: Secondary | ICD-10-CM

## 2021-11-23 ENCOUNTER — Other Ambulatory Visit: Payer: Self-pay | Admitting: Family Medicine

## 2021-11-23 DIAGNOSIS — B0230 Zoster ocular disease, unspecified: Secondary | ICD-10-CM

## 2021-11-29 ENCOUNTER — Telehealth: Payer: Self-pay

## 2021-11-29 DIAGNOSIS — E242 Drug-induced Cushing's syndrome: Secondary | ICD-10-CM | POA: Diagnosis not present

## 2021-11-29 DIAGNOSIS — E2749 Other adrenocortical insufficiency: Secondary | ICD-10-CM | POA: Diagnosis not present

## 2021-11-29 DIAGNOSIS — E041 Nontoxic single thyroid nodule: Secondary | ICD-10-CM | POA: Diagnosis not present

## 2021-11-29 NOTE — Telephone Encounter (Signed)
FYI:   Approval number: [id number] Dates approved: 12/01/2021

## 2021-12-01 ENCOUNTER — Ambulatory Visit (HOSPITAL_COMMUNITY)
Admission: RE | Admit: 2021-12-01 | Discharge: 2021-12-01 | Disposition: A | Discharge status: Home / Self Care | Payer: BC Managed Care – PPO | Source: Ambulatory Visit | Attending: Family Medicine | Admitting: Family Medicine

## 2021-12-01 DIAGNOSIS — M5090 Cervical disc disorder, unspecified, unspecified cervical region: Secondary | ICD-10-CM

## 2021-12-01 DIAGNOSIS — G5691 Unspecified mononeuropathy of right upper limb: Secondary | ICD-10-CM | POA: Diagnosis not present

## 2021-12-01 DIAGNOSIS — R519 Headache, unspecified: Secondary | ICD-10-CM | POA: Insufficient documentation

## 2021-12-01 DIAGNOSIS — G8929 Other chronic pain: Secondary | ICD-10-CM | POA: Insufficient documentation

## 2021-12-01 DIAGNOSIS — M5412 Radiculopathy, cervical region: Secondary | ICD-10-CM | POA: Diagnosis not present

## 2021-12-01 MED ORDER — GADOBUTROL 1 MMOL/ML IV SOLN
10.0000 mL | Freq: Once | INTRAVENOUS | Status: AC | PRN
Start: 2021-12-01 — End: 2021-12-01
  Administered 2021-12-01: 10 mL via INTRAVENOUS

## 2021-12-05 ENCOUNTER — Encounter: Payer: Self-pay | Admitting: Family Medicine

## 2021-12-05 ENCOUNTER — Telehealth: Payer: Self-pay | Admitting: Family Medicine

## 2021-12-05 DIAGNOSIS — R519 Headache, unspecified: Secondary | ICD-10-CM

## 2021-12-05 NOTE — Telephone Encounter (Signed)
Called pt to discuss her MRI reports She would like to pursue an LP for diagnosis.  Ordered for her today

## 2021-12-08 ENCOUNTER — Telehealth: Payer: Self-pay | Admitting: Family Medicine

## 2021-12-08 ENCOUNTER — Ambulatory Visit
Admission: RE | Admit: 2021-12-08 | Discharge: 2021-12-08 | Disposition: A | Discharge status: Home / Self Care | Payer: BC Managed Care – PPO | Source: Ambulatory Visit | Attending: Family Medicine | Admitting: Family Medicine

## 2021-12-08 ENCOUNTER — Encounter: Payer: Self-pay | Admitting: Family Medicine

## 2021-12-08 VITALS — BP 162/97 | HR 89

## 2021-12-08 DIAGNOSIS — E669 Obesity, unspecified: Secondary | ICD-10-CM

## 2021-12-08 DIAGNOSIS — G8929 Other chronic pain: Secondary | ICD-10-CM

## 2021-12-08 DIAGNOSIS — R519 Headache, unspecified: Secondary | ICD-10-CM | POA: Diagnosis not present

## 2021-12-08 MED ORDER — TOPIRAMATE 25 MG PO TABS: 25.0000 mg | ORAL_TABLET | Freq: Two times a day (BID) | ORAL | 3 refills | Status: DC

## 2021-12-08 NOTE — Telephone Encounter (Signed)
Received her LP report, gave patient a call Her pressure is in the equivocal range IMPRESSION: 1. Technically successful fluoroscopically guided lumbar puncture. 2. Mildly elevated opening CSF pressure of 23 cm water.  She does not currently have a neurologist since Dr. Anne Hahn retired. She is seeing her ophthalmologist next week, we will send her lumbar puncture report and MRI to Dr. Shari Prows so he will have these records available  I will place a referral to neurology right now. Otherwise asked patient to please keep me posted about her condition

## 2021-12-08 NOTE — Discharge Instructions (Signed)
Lumbar Puncture Discharge Instructions  Go home and rest quietly as needed. You may resume normal activities; however, do not exert yourself strongly or do any heavy lifting today and tomorrow.   DO NOT drive today.    You may resume your normal diet and medications unless otherwise indicated. Drink a lot of extra fluids today and tomorrow.   The incidence of a spinal headache (headache, nausea and/or vomiting is about 5% (one in 20 patients).  If you develop a headache when you are sitting up or standing that gets better when you lie down, please lie flat for 24 hours and drink plenty of fluids until the headache goes away.  Caffeinated beverages may be helpful.   If you develop severe nausea and vomiting or a headache that does not go away with the flat bedrest after 48 hours, please call [phone].   Call your physician for a follow-up appointment.  The results of your myelogram will be sent directly to your physician by the following day.  Please call us at [phone] if you have any questions or if complications develop after you arrive home.   Discharge instructions have been explained to the patient.  The patient, or the person responsible for the patient, state they fully understands these instructions.   Thank you for visiting our office today.    

## 2021-12-08 NOTE — Telephone Encounter (Signed)
I was able to touch base with neurology doc on-call who felt that patient is less likely to have intracranial hypertension.  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 25 mg bid.

## 2021-12-12 ENCOUNTER — Telehealth: Payer: Self-pay

## 2021-12-12 ENCOUNTER — Encounter: Payer: Self-pay | Admitting: Family Medicine

## 2021-12-12 LAB — CSF CULTURE W GRAM STAIN
MICRO NUMBER:: 13917444
Result:: NO GROWTH
SPECIMEN QUALITY:: ADEQUATE

## 2021-12-12 LAB — CSF CELL COUNT WITH DIFFERENTIAL
RBC Count, CSF: 0 cells/uL
TOTAL NUCLEATED CELL: 1 cells/uL (ref 0–5)

## 2021-12-12 NOTE — Telephone Encounter (Signed)
Aaron Edelman from [facility] called with CSF result.  CSF culture obtained on 12/08/21 has NO GROWTH on day 4.

## 2021-12-15 ENCOUNTER — Telehealth: Payer: Self-pay | Admitting: Family Medicine

## 2021-12-15 DIAGNOSIS — H5005 Alternating esotropia: Secondary | ICD-10-CM | POA: Diagnosis not present

## 2021-12-15 DIAGNOSIS — H53042 Amblyopia suspect, left eye: Secondary | ICD-10-CM | POA: Diagnosis not present

## 2021-12-15 DIAGNOSIS — Z83518 Family history of other specified eye disorder: Secondary | ICD-10-CM | POA: Diagnosis not present

## 2021-12-15 DIAGNOSIS — Z79899 Other long term (current) drug therapy: Secondary | ICD-10-CM | POA: Diagnosis not present

## 2021-12-15 NOTE — Telephone Encounter (Signed)
OV faxed

## 2021-12-15 NOTE — Telephone Encounter (Signed)
[facility] called stating they need the last office notes faxed over to them at [phone]. Please advise.

## 2022-01-02 DIAGNOSIS — R809 Proteinuria, unspecified: Secondary | ICD-10-CM | POA: Diagnosis not present

## 2022-01-02 DIAGNOSIS — M3130 Wegener's granulomatosis without renal involvement: Secondary | ICD-10-CM | POA: Diagnosis not present

## 2022-01-02 DIAGNOSIS — H15009 Unspecified scleritis, unspecified eye: Secondary | ICD-10-CM | POA: Diagnosis not present

## 2022-01-02 DIAGNOSIS — M0579 Rheumatoid arthritis with rheumatoid factor of multiple sites without organ or systems involvement: Secondary | ICD-10-CM | POA: Diagnosis not present

## 2022-01-30 ENCOUNTER — Ambulatory Visit (INDEPENDENT_AMBULATORY_CARE_PROVIDER_SITE_OTHER): Payer: BC Managed Care – PPO | Admitting: Neurology

## 2022-01-30 ENCOUNTER — Encounter: Payer: Self-pay | Admitting: Neurology

## 2022-01-30 VITALS — BP 148/98 | HR 81 | Ht 66.0 in | Wt 305.0 lb

## 2022-01-30 DIAGNOSIS — G479 Sleep disorder, unspecified: Secondary | ICD-10-CM

## 2022-01-30 DIAGNOSIS — R519 Headache, unspecified: Secondary | ICD-10-CM | POA: Diagnosis not present

## 2022-01-30 DIAGNOSIS — G932 Benign intracranial hypertension: Secondary | ICD-10-CM

## 2022-01-30 DIAGNOSIS — Z9189 Other specified personal risk factors, not elsewhere classified: Secondary | ICD-10-CM | POA: Diagnosis not present

## 2022-01-30 DIAGNOSIS — R0683 Snoring: Secondary | ICD-10-CM

## 2022-01-30 DIAGNOSIS — Z820 Family history of epilepsy and other diseases of the nervous system: Secondary | ICD-10-CM

## 2022-01-30 NOTE — Patient Instructions (Signed)
It was nice to see you today.  As discussed, we will proceed with a sleep study to have sleep apnea.  If you have obstructive sleep apnea, I will likely recommend treatment with a CPAP or AutoPap machine.  For now, please switch your Topamax to bedtime only, take 2 pills together for a total of 50 mg.  We may be able to switch you to the long-acting formulation of Topamax if need be for better tolerance.

## 2022-01-30 NOTE — Progress Notes (Signed)
Subjective:    Patient ID: Diane Wells is a 41 y.o. female.  HPI    Interim history:    Dear Dr. Lorelei Pont,  I saw your patient, Diane Wells, upon your kind request in my neurologic clinic today for initial consultation of her recurrent headaches.  The patient is unaccompanied today.  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  She has had associated nausea.  She has not had any strokelike symptoms thankfully.  She tries to hydrate well, avoids caffeine, limits herself to 1 serving per day on average.  She has tried Imitrex on a few occasions but did not find it helpful, Fioricet helps with severe tension headaches but not so much with the migrainous headaches she has had.  Imitrex caused her to feel dizzy and groggy, unable to function.  Of note, she does not sleep very well.  Sleep is interrupted and not restful.  Her Epworth sleepiness score is 13 out of 24, fatigue severity score is 57 out of 63 today.  She has never had a sleep study but would be willing to explore this.  She has a family history of sleep apnea affecting both parents.  She has a history of snoring.  I reviewed your office note from 11/17/2021.  She was recently started on Topamax for headaches.  She reported improvement with her Topamax.  She is currently taking 25 mg twice daily.  She has had some side effects including diarrhea, and nausea and cognitive slowing.  Her cognitive side effects improved and nausea improved but she still has intermittent diarrhea.  She had  a brain MRI with and without contrast on 12/01/2021 and I reviewed the results: Impression:1. Narrowing of the distal transverse sinuses near the transverse-sigmoid junction, with mild optic nerve tortuosity. These findings are nonspecific but can be seen in the setting of idiopathic intracranial hypertension, a cause of headaches. Correlate with symptoms and consider lumbar puncture with opening pressure if clinically indicated. 2. No additional acute intracranial process.  She had a cervical spine MRI without contrast on 12/01/2021 and I reviewed the results: Impression: 1. C6-C7 ACDF, without spinal canal stenosis or neural foraminal narrowing. 2. Mild degenerative changes in the cervical spine without spinal canal stenosis or neural foraminal narrowing.  She had a fluoroscopic guided lumbar puncture on 12/08/2021 and I reviewed the results: Impression: 1. Technically successful fluoroscopically guided lumbar puncture. 2. Mildly elevated opening CSF pressure of 23 cm water.   Her opening pressure was 23 cm, 8.5 mL of CSF were removed, closing pressure was 17 cm.  She has seen an ophthalmologist.  Her eye examination was reportedly normal.  She has seen Dr. Gala Romney at [facility] and follows with them on a regular basis, she reports a full eye examination with no evidence of eye nerve swelling and good visual fields.  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  She takes Robaxin twice daily, she also takes gabapentin, 300 in the morning and 600 at bedtime.  She is also followed by oncology, rheumatology and  pulmonology.     The patient's allergies, current medications, family history, past medical history, past social history, past surgical history and problem list were reviewed and updated as appropriate.   Previously:   05/07/19: 41 year old right-handed woman with an underlying medical history of asthma, diagnosis of rheumatoid arthritis last year, recent diagnosis  of autoimmune vasculitis and glomerulonephritis, followed by rheumatology, history of neck pain with status post cervical spine surgery in October 2016, low back pain, and morbid obesity with a BMI of over 40, who reports numbness in her feet, particularly the toes and dorsi of both feet for the past nearly 1 year, more noticeable since November 2020.  She has had some painful sensations including burning sensations, some symptoms also in her fingers particularly her ring fingers.  She has had some cramping in her hands.  She had significant blood work done recently, she has been on methotrexate in the past for her RA and was switched to Humira which did not help.  She is currently on prednisone 60 mg daily since 04/09/2019 and is supposed to taper it to 40 mg daily slowly over the next few weeks.  She is waiting to start a new medication, as prescribed by rheumatology for her vasculitis. I reviewed your office note from 04/11/2019.  You had concerns that she may have ulnar neuropathy and also S1 nerve root radiculopathy.  She was advised to proceed with a lumbar spine MRI.  She had a lumbar spine MRI recently, results are pending.  She had significant blood work last year in March, including rheumatological markers, autoimmune markers, CK level.  She has a history of low vitamin D, she has physical scheduled with her primary care physician next month.  She has been on prescription vitamin D in the past.  She has recently seen ophthalmology for episcleritis.  She has been using steroid eyedrops and the oral steroids have helped as well. Her hands and feet feel cold, she also has intolerance to sudden temperature changes.  She sometimes has extreme sensitivity to even light touch in those areas of her distal lower extremities as well as her hands. She has been on gabapentin off and on for the past few years.  She has taken it more consistently since November 2020, currently takes 300 mg twice daily.    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[place])    Hypertension    Neutropenia ([place])    hospitalized in early 2023 for 9 days   Recurrent upper respiratory infection (URI)     Her Past Surgical History Is Significant For: Past Surgical History:  Procedure Laterality Date   ADENOIDECTOMY     ANTERIOR CERVICAL DECOMP/DISCECTOMY FUSION      Her Family History Is Significant For: Family History  Problem Relation Age of Onset   Diabetes Mother        Type 1 Diabetes, juvenile onset   Hypertension Mother    Pulmonary Hypertension Mother    Hyperthyroidism Mother    Glaucoma Mother    Neuropathy Mother    Sjogren's syndrome Mother    Carpal tunnel syndrome Mother    Hypertension Father    Gallbladder disease Father    Hypertension Maternal Grandmother  Glaucoma Maternal Grandmother    Coronary artery disease Maternal Grandmother    Heart attack Maternal Grandmother    Angina Maternal Grandmother    Macular degeneration Maternal Grandmother    COPD Maternal Grandmother    Congestive Heart Failure Maternal Grandmother    Glaucoma Maternal Grandfather    Leukemia Paternal Grandmother    Hypertension Paternal Grandmother    Colon cancer Paternal Grandfather    Diabetes Maternal Uncle    Heart attack Maternal Uncle    Stroke Maternal Uncle    Allergic rhinitis Neg Hx    Angioedema Neg Hx    Asthma Neg Hx    Eczema Neg Hx    Pseudotumor cerebri Neg Hx    Migraines Neg Hx     Her Social History Is Significant For: Social History   Socioeconomic History   Marital status: Single    Spouse name: Not on file   Number of children: 0   Years of education: Not on file   Highest education level: Not on file  Occupational History   Not on file  Tobacco Use    Smoking status: Never   Smokeless tobacco: Never  Vaping Use   Vaping Use: Never used  Substance and Sexual Activity   Alcohol use: Not Currently    Comment: was rare   Drug use: No   Sexual activity: Not Currently    Birth control/protection: None  Other Topics Concern   Not on file  Social History Narrative   Lives with her parents to help take care of them    Right handed   Caffeine: small cup of coffee once every other month and then caffeine in Fioricet    Social Determinants of Health   Financial Resource Strain: Not on file  Food Insecurity: Not on file  Transportation Needs: Not on file  Physical Activity: Not on file  Stress: Not on file  Social Connections: Not on file    Her Allergies Are:  Allergies  Allergen Reactions   Albuterol Shortness Of Breath and Other (See Comments)    Tolerates HFA inhaler, nebulizer treatment caused O2 drop   Other Anaphylaxis    Essential Oil, scent unknown, caused tongue swelling   Truxima [Rituximab] Anaphylaxis   Sulfa Antibiotics Hives   Vancomycin Itching    Scalp itching - felt like it was on fire  :   Her Current Medications Are:  Outpatient Encounter Medications as of 01/30/2022  Medication Sig   albuterol (VENTOLIN HFA) 108 (90 Base) MCG/ACT inhaler INHALE 1-2 PUFFS INTO THE LUNGS DAILY AS NEEDED FOR SHORTNESS OF BREATH. DAW   budesonide-formoterol (SYMBICORT) 160-4.5 MCG/ACT inhaler Inhale 2 puffs into the lungs in the morning and at bedtime.   butalbital-acetaminophen-caffeine (FIORICET) 50-325-40 MG tablet TAKE 1 TABLET BY MOUTH EVERY 6 HOURS AS NEEDED FOR HEADACHE.   EPINEPHrine 0.3 mg/0.3 mL IJ SOAJ injection Inject 0.3 mg into the muscle once as needed for anaphylaxis.   fexofenadine (ALLEGRA) 180 MG tablet Take 180 mg by mouth every morning.   fluticasone (FLONASE) 50 MCG/ACT nasal spray Place 2 sprays into both nostrils in the morning and at bedtime.   furosemide (LASIX) 20 MG tablet TAKE 1 (ONE) TABLET DAILY AS  NEEDED (Patient taking differently: Take 20-40 mg by mouth daily as needed (swelling).)   gabapentin (NEURONTIN) 300 MG capsule Take 1-2 capsules three times a day as needed   hydrocortisone (CORTEF) 10 MG tablet Take 5 mg by mouth every morning.   hydroxychloroquine (PLAQUENIL)  200 MG tablet Take 1 tablet (200 mg total) by mouth 2 (two) times daily. Hold until ok with your rheumatologist   ketorolac (ACULAR) 0.5 % ophthalmic solution 1 drop See admin instructions. Apply one drop into left eye daily at bedtime, may also use up to three more times daily in affected eye(s) as needed for scleritis   losartan (COZAAR) 25 MG tablet TAKE 3 TABLETS BY MOUTH DAILY (Patient taking differently: Take 75 mg by mouth at bedtime.)   methocarbamol (ROBAXIN) 750 MG tablet TAKE 1 TABLET (750 MG TOTAL) BY MOUTH EVERY 8 (EIGHT) HOURS AS NEEDED FOR MUSCLE SPASMS. (Patient taking differently: Take 750 mg by mouth every 8 (eight) hours as needed for muscle spasms. Takes BID most of the time)   mycophenolate (CELLCEPT) 500 MG tablet Take 3 tablets (1,500 mg total) by mouth 2 (two) times daily. Hold this meds, resume when ok with your rheumatologist   ondansetron (ZOFRAN) 8 MG tablet Take 1 tablet (8 mg total) by mouth every 8 (eight) hours as needed for nausea or vomiting.   SUMAtriptan (IMITREX) 100 MG tablet Take 1 tablet (100 mg total) by mouth every 2 (two) hours as needed for migraine. May repeat in 2 hours if headache persists or recurs.  Max 200 mg daily   topiramate (TOPAMAX) 25 MG tablet Take 1 tablet (25 mg total) by mouth 2 (two) times daily. For headache prevention   prednisoLONE acetate (PRED FORTE) 1 % ophthalmic suspension Place 1 drop into the right eye 4 (four) times daily.   venlafaxine XR (EFFEXOR-XR) 37.5 MG 24 hr capsule Take 1 capsule (37.5 mg total) by mouth daily with breakfast.   No facility-administered encounter medications on file as of 01/30/2022.  :  Review of Systems:  Out of a complete 14  point review of systems, all are reviewed and negative with the exception of these symptoms as listed below:   Review of Systems  Neurological:        Patient is here alone for evaluation of headaches and possible increased intracranial hypertension seen on MRI 12/01/21. Patient also had an LP on 12/08/21 and reports spinal pressure was 23. She was started on Topiramate approximately 12/16/21. She reports improvement in headaches although she notes GI side effects. She is unsure she wants to stay on Topiramate long term.     Objective:  Neurological Exam  Physical Exam Physical Examination:   Vitals:   01/30/22 1455  BP: (!) 148/98  Pulse: 81    General Examination: The patient is a very pleasant 41 y.o. female in no acute distress. She appears well-developed and well-nourished and well groomed.   HEENT: Normocephalic, atraumatic, pupils are equal, round and reactive to light, no photophobia.  Extraocular tracking well-preserved, airway examination reveals mild weakness, good dental hygiene, mild airway crowding.  Tongue protrudes centrally and palate elevates symmetrically.  Smaller tonsils noted, Mallampati class II.     Chest: Clear to auscultation without wheezing, rhonchi or crackles noted.   Heart: S1+S2+0, regular and normal without murmurs, rubs or gallops noted.    Abdomen: Soft, non-tender and non-distended.   Extremities: There is trace pitting edema in the distal lower extremities bilaterally.    Skin: Hands and feet are cooler to touch, mild mottling of the skin is noted.    Musculoskeletal: exam reveals no obvious joint deformities, but reports pain in her wrists and left hip and both knees.      Neurologically:  Mental status: The patient is awake, alert  and oriented in all 4 spheres. Her immediate and remote memory, attention, language skills and fund of knowledge are appropriate. There is no evidence of aphasia, agnosia, apraxia or anomia. Speech is clear with normal  prosody and enunciation. Thought process is linear. Mood is normal and affect is normal.  Cranial nerves II - XII are as described above under HEENT exam.  Motor exam: Normal bulk, strength and tone is noted. There is no obvious tremor.  Reflexes are 1+ in the upper extremities and difficult to elicit in the lower extremities. Fine motor skills and coordination: intact grossly.   Cerebellar testing: No dysmetria or intention tremor on finger to nose testing. Heel to shin is difficult for her but no dysmetria noted.    Sensory exam: intact to light touch in the upper and lower extremities.  Gait, station and balance: She stands easily. No veering to one side is noted. No leaning to one side is noted. Posture is age-appropriate and stance is narrow based. Gait shows normal stride length and normal pace. No problems turning are noted.    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  She has a history of recurrent headaches for years but they have become worse over time especially in the past 6 months.  Her headaches could be multifactorial including tension headaches, cervicogenic headaches, underlying obstructive sleep apnea may be a cause as well.  She has a complex history, has had some symptomatic relief with Topamax low-dose but has also had a few side effects.  While diarrhea is not a common side effect is not impossible that she may have some diarrhea from the Topamax.  We mutually agreed to have her take the Topamax at 50 mg nightly for now.  We may be able to switch her to a long-acting formulation if need be in the future.  We will  go ahead with a sleep study to help rule out an underlying sleep disturbance, sleep deprivation can be a contributor for recurrent headaches and untreated obstructive sleep apnea can also cause recurrent headaches.  She had recent evaluation through ophthalmology.  She had a recent brain scan and a lumbar puncture, with a very mild increase in opening pressure.  I think Topamax is a good choice altogether for her for now.  If her sleep study reveals obstructive sleep apnea, she would be willing to try CPAP therapy.  I was able to show her a CPAP/AutoPap machine model today.  We will plan a follow-up after sleep testing, we will keep her posted as to her test results by phone call as well.  I answered all her questions today and she was in agreement with our plan. Thank you very much for allowing me to participate in the care of this nice patient. If I can be of any further assistance to you please do not hesitate to call me at [phone].  Sincerely,   Star Age, MD, PhD  I spent 45 minutes in total face-to-face time and in reviewing records during pre-charting, more than 50% of which was spent in counseling and coordination of care, reviewing test results, reviewing medications and treatment regimen and/or in discussing or reviewing  the diagnosis of recurrent headaches, the prognosis and treatment options. Pertinent laboratory and imaging test results that were available during this visit with the patient were reviewed by me and considered in my medical decision making (see chart for details).

## 2022-02-19 ENCOUNTER — Other Ambulatory Visit: Payer: Self-pay | Admitting: Family Medicine

## 2022-02-19 DIAGNOSIS — G5691 Unspecified mononeuropathy of right upper limb: Secondary | ICD-10-CM

## 2022-02-19 DIAGNOSIS — M62838 Other muscle spasm: Secondary | ICD-10-CM

## 2022-02-19 DIAGNOSIS — M5090 Cervical disc disorder, unspecified, unspecified cervical region: Secondary | ICD-10-CM

## 2022-02-24 ENCOUNTER — Encounter: Payer: Self-pay | Admitting: Neurology

## 2022-03-01 ENCOUNTER — Other Ambulatory Visit: Payer: Self-pay | Admitting: Family Medicine

## 2022-03-27 ENCOUNTER — Other Ambulatory Visit: Payer: Self-pay | Admitting: Internal Medicine

## 2022-03-31 ENCOUNTER — Ambulatory Visit (INDEPENDENT_AMBULATORY_CARE_PROVIDER_SITE_OTHER): Payer: BC Managed Care – PPO | Admitting: Neurology

## 2022-03-31 DIAGNOSIS — G479 Sleep disorder, unspecified: Secondary | ICD-10-CM

## 2022-03-31 DIAGNOSIS — G4733 Obstructive sleep apnea (adult) (pediatric): Secondary | ICD-10-CM

## 2022-03-31 DIAGNOSIS — Z820 Family history of epilepsy and other diseases of the nervous system: Secondary | ICD-10-CM

## 2022-03-31 DIAGNOSIS — R519 Headache, unspecified: Secondary | ICD-10-CM

## 2022-03-31 DIAGNOSIS — Z9189 Other specified personal risk factors, not elsewhere classified: Secondary | ICD-10-CM

## 2022-03-31 DIAGNOSIS — G932 Benign intracranial hypertension: Secondary | ICD-10-CM

## 2022-03-31 DIAGNOSIS — R0683 Snoring: Secondary | ICD-10-CM

## 2022-03-31 DIAGNOSIS — G4720 Circadian rhythm sleep disorder, unspecified type: Secondary | ICD-10-CM

## 2022-04-04 DIAGNOSIS — M3130 Wegener's granulomatosis without renal involvement: Secondary | ICD-10-CM | POA: Diagnosis not present

## 2022-04-04 NOTE — Procedures (Unsigned)
Physician Interpretation:   Referred by: Dr. 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  Sleep is interrupted and not restful.  Her Epworth sleepiness score is 13 out of 24, fatigue severity score is 57 out of 63.   EEG: Review of the EEG showed no abnormal electrical discharges and symmetrical bihemispheric findings.    EKG: The EKG revealed normal sinus rhythm (NSR).   AUDIO/VIDEO REVIEW: The audio and video review did not show any abnormal or unusual behaviors, movements, phonations or vocalizations. The patient took no restroom visits.  Mild intermittent snoring was noted.  POST-STUDY QUESTIONNAIRE: Post study, the patient indicated, that sleep was the same as usual (maybe a little "lighter").   IMPRESSION:   Borderline Obstructive sleep apnea (OSA) Dysfunctions associated with sleep stages or arousal from sleep  RECOMMENDATIONS:   This study does not demonstrate any significant obstructive or central sleep disordered breathing.  Her total AHI was in the borderline range at 5.3/h, O2 nadir 90%.  Mild intermittent snoring was detected, treatment with a positive airway pressure device such as CPAP or AutoPap is not recommended, weight loss and avoidance of the supine sleep position may suffice as treatment for borderline sleep apnea and mild snoring.  This study shows sleep fragmentation - ranging from minimal to moderate - and abnormal sleep stage percentages; these are nonspecific findings and per se do not signify an intrinsic sleep disorder or a cause for the patient's sleep-related symptoms. Causes include (but are not limited to) the first night effect of the sleep  study, circadian rhythm disturbances, medication effect or an underlying mood disorder or medical problem.  The patient should be cautioned not to drive, work at heights, or operate dangerous or heavy equipment when tired or sleepy. Review and reiteration of good sleep hygiene measures should be pursued with any patient. The patient and her referring provider will be notified of the test results.   I certify that I have reviewed the entire raw data recording prior to the issuance of this report in accordance with the Standards of Accreditation of the American Academy of Sleep Medicine (AASM).  Star Age, MD, PhD Medical Director, [facility]) [place], ABPN (Neurology and Sleep)   Technical Report:   ***

## 2022-04-06 ENCOUNTER — Ambulatory Visit (HOSPITAL_BASED_OUTPATIENT_CLINIC_OR_DEPARTMENT_OTHER)
Admission: RE | Admit: 2022-04-06 | Discharge: 2022-04-06 | Disposition: A | Discharge status: Home / Self Care | Payer: BC Managed Care – PPO | Source: Ambulatory Visit | Attending: Family Medicine | Admitting: Family Medicine

## 2022-04-06 ENCOUNTER — Encounter: Payer: Self-pay | Admitting: Family Medicine

## 2022-04-06 ENCOUNTER — Ambulatory Visit (INDEPENDENT_AMBULATORY_CARE_PROVIDER_SITE_OTHER): Payer: BC Managed Care – PPO | Admitting: Family Medicine

## 2022-04-06 VITALS — BP 112/62 | HR 107 | Temp 98.1°F | Resp 18 | Ht 67.0 in | Wt 306.6 lb

## 2022-04-06 DIAGNOSIS — R1013 Epigastric pain: Secondary | ICD-10-CM

## 2022-04-06 DIAGNOSIS — R0602 Shortness of breath: Secondary | ICD-10-CM

## 2022-04-06 DIAGNOSIS — J029 Acute pharyngitis, unspecified: Secondary | ICD-10-CM | POA: Diagnosis not present

## 2022-04-06 DIAGNOSIS — J0110 Acute frontal sinusitis, unspecified: Secondary | ICD-10-CM

## 2022-04-06 LAB — TROPONIN I (HIGH SENSITIVITY): High Sens Troponin I: 3 ng/L (ref 2–17)

## 2022-04-06 LAB — D-DIMER, QUANTITATIVE: D-Dimer, Quant: 0.25 mcg/mL FEU (ref <0.50)

## 2022-04-06 LAB — POCT RAPID STREP A (OFFICE): Rapid Strep A Screen: NEGATIVE

## 2022-04-06 MED ORDER — AMOXICILLIN-POT CLAVULANATE 875-125 MG PO TABS: 1.0000 | ORAL_TABLET | Freq: Two times a day (BID) | ORAL | 0 refills | Status: DC

## 2022-04-06 NOTE — Patient Instructions (Signed)
Please go to lab and then to the ground floor x-ray dept  I will be in touch with your results asap If you are getting worse or not improving please let me know!

## 2022-04-06 NOTE — Progress Notes (Signed)
[facility] [address], [address] [place], [place] [address] [phone]  Date:  04/06/2022   Name:  Diane Wells   DOB:  1980/10/30   MRN:  [id number]  PCP:  Pearline Cables, MD    Chief Complaint: URI (Pt c/o chills, headaches, nausea/ dizziness, she has tested neg for covid. Pt says she has been ill x 4-5 weeks. She has lad labs at Rheum. She thinks she has a sinus infection. )   History of Present Illness:  Diane Wells is a 42 y.o. 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  She denies any actual chest pain She also has noted some epigastric and right upper quadrant pain for few weeks  She tested for covid twice- most recently 2 days ago   Over the last 2 weeks she felt worse- she got labs from her rheum this week, a CBC and Chem looked ok She notes she was not neutropenic at most recent lab work  LMP was last week- not SA for about 2 years so she is certain she is not pregnant   We discussed stress dosing her baseline steroids-she is currently with this process and can double her steroid dose for a few days if need be    Patient Active Problem List   Diagnosis Date Noted   Neutropenia (HCC) 06/15/2021    Cyst of ovary, right 06/05/2021   Left thyroid nodule 06/05/2021   Neutropenic fever (HCC) 06/04/2021   Lymphadenopathy 06/03/2021   Mouth sores 06/03/2021   SIRS (systemic inflammatory response syndrome) (HCC) 06/03/2021   Essential (primary) hypertension 05/31/2021   Iatrogenic Cushing's syndrome (HCC) 05/31/2021   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/31/2021   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   Medication management 05/27/2019   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   Patellar dislocation 01/17/2018   Acute left ankle pain 11/27/2017   Allergic reaction 02/15/2016   Moderate persistent asthma without complication 02/15/2016   Perennial and seasonal allergic rhinitis 02/15/2016   Allergic conjunctivitis 02/15/2016   Obesity, unspecified obesity severity, unspecified obesity type 01/10/2016    Past Medical History:  Diagnosis Date   Adrenal insufficiency (HCC)    Allergic rhinitis    Asthma    Astigmatism    Chronic kidney disease    Cushing's disease (HCC)    idiopathic   Granulomatosis with polyangiitis (HCC)    Hypertension    Neutropenia (HCC)    hospitalized in early 2023 for 9 days   Recurrent upper respiratory infection (URI)     Past Surgical History:  Procedure Laterality Date   ADENOIDECTOMY     ANTERIOR CERVICAL DECOMP/DISCECTOMY FUSION      Social History  Tobacco Use   Smoking status: Never   Smokeless tobacco: Never  Vaping Use   Vaping Use: Never used  Substance Use Topics   Alcohol use: Not Currently    Comment: was rare   Drug use: No    Family History  Problem Relation Age of Onset   Diabetes Mother        Type 1 Diabetes, juvenile onset   Hypertension Mother    Pulmonary Hypertension Mother    Hyperthyroidism Mother    Glaucoma Mother    Neuropathy Mother    Sjogren's syndrome Mother    Carpal tunnel syndrome Mother    Hypertension Father     Gallbladder disease Father    Hypertension Maternal Grandmother    Glaucoma Maternal Grandmother    Coronary artery disease Maternal Grandmother    Heart attack Maternal Grandmother    Angina Maternal Grandmother    Macular degeneration Maternal Grandmother    COPD Maternal Grandmother    Congestive Heart Failure Maternal Grandmother    Glaucoma Maternal Grandfather    Leukemia Paternal Grandmother    Hypertension Paternal Grandmother    Colon cancer Paternal Grandfather    Diabetes Maternal Uncle    Heart attack Maternal Uncle    Stroke Maternal Uncle    Allergic rhinitis Neg Hx    Angioedema Neg Hx    Asthma Neg Hx    Eczema Neg Hx    Pseudotumor cerebri Neg Hx    Migraines Neg Hx     Allergies  Allergen Reactions   Albuterol Shortness Of Breath and Other (See Comments)    Tolerates HFA inhaler, nebulizer treatment caused O2 drop   Other Anaphylaxis    Essential Oil, scent unknown, caused tongue swelling   Truxima [Rituximab] Anaphylaxis   Sulfa Antibiotics Hives   Vancomycin Itching    Scalp itching - felt like it was on fire    Medication list has been reviewed and updated.  Current Outpatient Medications on File Prior to Visit  Medication Sig Dispense Refill   albuterol (VENTOLIN HFA) 108 (90 Base) MCG/ACT inhaler INHALE 1-2 PUFFS INTO THE LUNGS DAILY AS NEEDED FOR SHORTNESS OF BREATH. DAW 18 each 2   butalbital-acetaminophen-caffeine (FIORICET) 50-325-40 MG tablet TAKE 1 TABLET BY MOUTH EVERY 6 HOURS AS NEEDED FOR HEADACHE. 60 tablet 0   EPINEPHrine 0.3 mg/0.3 mL IJ SOAJ injection Inject 0.3 mg into the muscle once as needed for anaphylaxis.     fexofenadine (ALLEGRA) 180 MG tablet Take 180 mg by mouth every morning.     furosemide (LASIX) 20 MG tablet TAKE 1 (ONE) TABLET DAILY AS NEEDED (Patient taking differently: Take 20-40 mg by mouth daily as needed (swelling).) 90 tablet 0   gabapentin (NEURONTIN) 300 MG capsule Take 1-2 capsules three times a day as  needed 270 capsule 1   hydrocortisone (CORTEF) 10 MG tablet Take 5 mg by mouth every morning.     hydroxychloroquine (PLAQUENIL) 200 MG tablet Take 1 tablet (200 mg total) by mouth 2 (two) times daily. Hold until ok with your rheumatologist     ketorolac (ACULAR) 0.5 % ophthalmic solution 1 drop See admin instructions. Apply one drop into left eye daily at bedtime, may also use up to three more times daily in affected eye(s) as needed for scleritis     losartan (COZAAR) 25 MG tablet TAKE 3 TABLETS BY MOUTH DAILY (Patient taking differently: Take 75 mg by mouth at bedtime.) 270 tablet 1   methocarbamol (ROBAXIN) 750 MG tablet  Take 1 tablet (750 mg total) by mouth every 8 (eight) hours as needed for muscle spasms. 90 tablet 1   mycophenolate (CELLCEPT) 500 MG tablet Take 3 tablets (1,500 mg total) by mouth 2 (two) times daily. Hold this meds, resume when ok with your rheumatologist     ondansetron (ZOFRAN) 8 MG tablet Take 1 tablet (8 mg total) by mouth every 8 (eight) hours as needed for nausea or vomiting. 30 tablet 2   SUMAtriptan (IMITREX) 100 MG tablet Take 1 tablet (100 mg total) by mouth every 2 (two) hours as needed for migraine. May repeat in 2 hours if headache persists or recurs.  Max 200 mg daily 10 tablet 0   SYMBICORT 160-4.5 MCG/ACT inhaler INHALE 2 PUFFS INTO THE LUNGS IN THE MORNING AND AT BEDTIME. 10.2 each 5   topiramate (TOPAMAX) 25 MG tablet Take 1 tablet (25 mg total) by mouth 2 (two) times daily. For headache prevention 180 tablet 0   fluticasone (FLONASE) 50 MCG/ACT nasal spray Place 2 sprays into both nostrils in the morning and at bedtime. 16 g 11   No current facility-administered medications on file prior to visit.    Review of Systems:  As per HPI- otherwise negative.   Physical Examination: Vitals:   04/06/22 1043  BP: 112/62  Pulse: (!) 107  Resp: 18  Temp: 98.1 F (36.7 C)  SpO2: 98%   Vitals:   04/06/22 1043  Weight: (!) 306 lb 9.6 oz (139.1 kg)   Height: 5\' 7"  (1.702 m)   Body mass index is 48.02 kg/m. Ideal Body Weight: Weight in (lb) to have BMI = 25: 159.3  GEN: no acute distress.  Obese, looks her normal self HEENT: Atraumatic, Normocephalic. Bilateral TM wnl, oropharynx is erythematous but no exudate.  PEERL,EOMI.   Ears and Nose: No external deformity. CV: RRR, No M/G/R. No JVD. No thrill. No extra heart sounds. PULM: CTA B, no wheezes, crackles, rhonchi. No retractions. No resp. distress. No accessory muscle use. ABD: S, NT, ND, +BS. No rebound. No HSM. EXTR: No c/c/e PSYCH: Normally interactive. Conversant.   EKG: Sinus rhythm.  There is low voltage in the precordial leads, possible old infarct.  Compared with EKG from March 2023 no significant changes noted except for lower voltage in the chest leads   Assessment and Plan: Epigastric pain - Plan: US Abdomen Limited RUQ (LIVER/GB)  Sore throat - Plan: POCT rapid strep A  Acute non-recurrent frontal sinusitis - Plan: amoxicillin-clavulanate (AUGMENTIN) 875-125 MG tablet  SOB (shortness of breath) - Plan: DG Chest 2 View, D-Dimer, Quantitative, EKG 12-Lead, Troponin I (High Sensitivity) Patient with chronic illness, seen today with concern of not feeling well for several weeks.  At this time she feels most like she may have a sinus infection.  We will treat her with Augmentin.  However, she also has concern of shortness of breath and right upper quadrant pain Will obtain a chest film, D-dimer and troponin.  She understands if her D-dimer is positive we will recommend a CT angiogram  Signed Lamar Blinks, MD  Received her x-rays and labs as below, message to patient  Results for orders placed or performed in visit on 04/06/22  D-Dimer, Quantitative  Result Value Ref Range   D-Dimer, Quant 0.25 <0.50 mcg/mL FEU  POCT rapid strep A  Result Value Ref Range   Rapid Strep A Screen Negative Negative  Troponin I (High Sensitivity)  Result Value Ref Range   High  Sens Troponin I  3 2 - 17 ng/L    DG Chest 2 View  Result Date: 04/06/2022 CLINICAL DATA:  Shortness of breath EXAM: CHEST - 2 VIEW COMPARISON:  06/13/2021 FINDINGS: The heart size and mediastinal contours are within normal limits. Both lungs are clear. The visualized skeletal structures are unremarkable. IMPRESSION: No active cardiopulmonary disease. Electronically Signed   By: Duanne Guess D.O.   On: 04/06/2022 12:05

## 2022-04-11 NOTE — Addendum Note (Signed)
Addended by: Lamar Blinks C on: 04/11/2022 01:10 PM   Modules accepted: Orders

## 2022-04-12 ENCOUNTER — Telehealth: Payer: Self-pay | Admitting: *Deleted

## 2022-04-12 ENCOUNTER — Ambulatory Visit (HOSPITAL_BASED_OUTPATIENT_CLINIC_OR_DEPARTMENT_OTHER): Payer: BC Managed Care – PPO

## 2022-04-12 NOTE — Telephone Encounter (Signed)
-----  Message from Star Age, MD sent at 04/06/2022  5:40 PM EST ----- Patient referred by Dr. Lorelei Pont, seen by me on 01/30/22, diagnostic PSG on 03/31/22.   Please call and notify the patient that the recent sleep study did not show any significant obstructive sleep apnea. Her total AHI was in the borderline range at 5.3/h, O2 nadir 90%.  Mild intermittent snoring was detected, treatment with a positive airway pressure device such as CPAP or AutoPap is not recommended, weight loss and avoidance of the supine sleep position may suffice as treatment for borderline sleep apnea and mild snoring.  Pls ask her to FU to see NP for headache management, offer next available appt.   Thanks,  Star Age, MD, PhD [facility])

## 2022-04-12 NOTE — Telephone Encounter (Signed)
Called pt and relayed the results of her sleep study per below.  She also having SE with topamax (diarrhea).  She is taking only 25mg  po daily at this time.  Made appt with JM/NP next available to discuss med management.  No PAP therapy recommended.  Pt did verbalize understanding.

## 2022-04-18 NOTE — Progress Notes (Deleted)
[facility] [address] [place]. [place] [address] [phone]       OFFICE FOLLOW UP NOTE  Diane Wells Date of Birth:  1981-02-05 Medical Record Number:  [id number]   Primary neurologist: Dr. Rexene Alberts Reason for visit: Headaches    SUBJECTIVE:   CHIEF COMPLAINT:  No chief complaint on file.   HPI:   Update 04/18/2022 JM: Patient returns for sooner visit to discuss medication management for headaches.  Complains of low-dose topiramate contributing to persistent diarrhea.  Currently experiencing *** migraine days per month.    Completed sleep study 03/31/2022 which did not show any significant sleep apnea.      Consult visit 01/30/2022 Dr. Rexene Alberts: Diane Wells is a 42 year old female with an underlying medical history of chronic kidney disease with history of glomerulonephritis, autoimmune vasculitis, chronic neck pain with status post cervical spine surgery, low back pain, asthma, rheumatoid arthritis,and severe obesity with a BMI of over 40, who reports recurrent headaches for the past several years, in the past 6 months her headaches have become worse and are primarily left-sided, even as frequent as 3-4 times a week at one point.  She has had associated nausea.  She has not had any strokelike symptoms thankfully.  She tries to hydrate well, avoids caffeine, limits herself to 1 serving per day on average.  She has tried Imitrex on a few occasions but did not find it helpful, Fioricet helps with severe tension headaches but not so much with the migrainous headaches she has had.  Imitrex caused her to feel dizzy and groggy, unable to function.   Of note, she does not sleep very well.  Sleep is interrupted and not restful.  Her Epworth sleepiness score is 13 out of 24, fatigue severity score is 57 out of 63 today.  She has never had a sleep study but would be willing to explore this.  She has a family history of sleep apnea affecting both parents.  She has a  history of snoring.   I reviewed your office note from 11/17/2021.  She was recently started on Topamax for headaches.  She reported improvement with her Topamax.  She is currently taking 25 mg twice daily.  She has had some side effects including diarrhea, and nausea and cognitive slowing.  Her cognitive side effects improved and nausea improved but she still has intermittent diarrhea.   She had a brain MRI with and without contrast on 12/01/2021 and I reviewed the results: Impression:1. Narrowing of the distal transverse sinuses near the transverse-sigmoid junction, with mild optic nerve tortuosity. These findings are nonspecific but can be seen in the setting of idiopathic intracranial hypertension, a cause of headaches. Correlate with symptoms and consider lumbar puncture with opening pressure if clinically indicated. 2. No additional acute intracranial process.   She had a cervical spine MRI without contrast on 12/01/2021 and I reviewed the results: Impression: 1. C6-C7 ACDF, without spinal canal stenosis or neural foraminal narrowing. 2. Mild degenerative changes in the cervical spine without spinal canal stenosis or neural foraminal narrowing.   She had a fluoroscopic guided lumbar puncture on 12/08/2021 and I reviewed the results: Impression: 1. Technically successful fluoroscopically guided lumbar puncture. 2. Mildly elevated opening CSF pressure of 23 cm water.     Her opening pressure was 23 cm, 8.5 mL of CSF were removed, closing pressure was 17 cm.   She has seen an ophthalmologist.  Her eye examination was reportedly normal.  She has seen Dr.  Gala Romney at [facility] and follows with them on a regular basis, she reports a full eye examination with no evidence of eye nerve swelling and good visual fields.  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  She takes Robaxin twice daily, she also takes gabapentin, 300 in the morning and 600 at bedtime.   She is also  followed by oncology, rheumatology and pulmonology.      ROS:   14 system review of systems performed and negative with exception of ***  PMH:  Past Medical History:  Diagnosis Date   Adrenal insufficiency (HCC)    Allergic rhinitis    Asthma    Astigmatism    Chronic kidney disease    Cushing's disease (HCC)    idiopathic   Granulomatosis with polyangiitis (HCC)    Hypertension    Neutropenia ([facility])    hospitalized in early 2023 for 9 days   Recurrent upper respiratory infection (URI)     PSH:  Past Surgical History:  Procedure Laterality Date   ADENOIDECTOMY     ANTERIOR CERVICAL DECOMP/DISCECTOMY FUSION      Social History:  Social History   Socioeconomic History   Marital status: Single    Spouse name: Not on file   Number of children: 0   Years of education: Not on file   Highest education level: Not on file  Occupational History   Not on file  Tobacco Use   Smoking status: Never   Smokeless tobacco: Never  Vaping Use   Vaping Use: Never used  Substance and Sexual Activity   Alcohol use: Not Currently    Comment: was rare   Drug use: No   Sexual activity: Not Currently    Birth control/protection: None  Other Topics Concern   Not on file  Social History Narrative   Lives with her parents to help take care of them    Right handed   Caffeine: small cup of coffee once every other month and then caffeine in Fioricet    Social Determinants of Health   Financial Resource Strain: Not on file  Food Insecurity: Not on file  Transportation Needs: Not on file  Physical Activity: Not on file  Stress: Not on file  Social Connections: Not on file  Intimate Partner Violence: Not on file    Family History:  Family History  Problem Relation Age of Onset   Diabetes Mother        Type 1 Diabetes, juvenile onset   Hypertension Mother    Pulmonary Hypertension Mother    Hyperthyroidism Mother    Glaucoma Mother    Neuropathy Mother    Sjogren's  syndrome Mother    Carpal tunnel syndrome Mother    Hypertension Father    Gallbladder disease Father    Hypertension Maternal Grandmother    Glaucoma Maternal Grandmother    Coronary artery disease Maternal Grandmother    Heart attack Maternal Grandmother    Angina Maternal Grandmother    Macular degeneration Maternal Grandmother    COPD Maternal Grandmother    Congestive Heart Failure Maternal Grandmother    Glaucoma Maternal Grandfather    Leukemia Paternal Grandmother    Hypertension Paternal Grandmother    Colon cancer Paternal Grandfather    Diabetes Maternal Uncle    Heart attack Maternal Uncle    Stroke Maternal Uncle    Allergic rhinitis Neg Hx    Angioedema Neg Hx    Asthma Neg Hx  Eczema Neg Hx    Pseudotumor cerebri Neg Hx    Migraines Neg Hx     Medications:   Current Outpatient Medications on File Prior to Visit  Medication Sig Dispense Refill   albuterol (VENTOLIN HFA) 108 (90 Base) MCG/ACT inhaler INHALE 1-2 PUFFS INTO THE LUNGS DAILY AS NEEDED FOR SHORTNESS OF BREATH. DAW 18 each 2   amoxicillin-clavulanate (AUGMENTIN) 875-125 MG tablet Take 1 tablet by mouth 2 (two) times daily. 20 tablet 0   butalbital-acetaminophen-caffeine (FIORICET) 50-325-40 MG tablet TAKE 1 TABLET BY MOUTH EVERY 6 HOURS AS NEEDED FOR HEADACHE. 60 tablet 0   EPINEPHrine 0.3 mg/0.3 mL IJ SOAJ injection Inject 0.3 mg into the muscle once as needed for anaphylaxis.     fexofenadine (ALLEGRA) 180 MG tablet Take 180 mg by mouth every morning.     fluticasone (FLONASE) 50 MCG/ACT nasal spray Place 2 sprays into both nostrils in the morning and at bedtime. 16 g 11   furosemide (LASIX) 20 MG tablet TAKE 1 (ONE) TABLET DAILY AS NEEDED (Patient taking differently: Take 20-40 mg by mouth daily as needed (swelling).) 90 tablet 0   gabapentin (NEURONTIN) 300 MG capsule Take 1-2 capsules three times a day as needed 270 capsule 1   hydrocortisone (CORTEF) 10 MG tablet Take 5 mg by mouth every morning.      hydroxychloroquine (PLAQUENIL) 200 MG tablet Take 1 tablet (200 mg total) by mouth 2 (two) times daily. Hold until ok with your rheumatologist     ketorolac (ACULAR) 0.5 % ophthalmic solution 1 drop See admin instructions. Apply one drop into left eye daily at bedtime, may also use up to three more times daily in affected eye(s) as needed for scleritis     losartan (COZAAR) 25 MG tablet TAKE 3 TABLETS BY MOUTH DAILY (Patient taking differently: Take 75 mg by mouth at bedtime.) 270 tablet 1   methocarbamol (ROBAXIN) 750 MG tablet Take 1 tablet (750 mg total) by mouth every 8 (eight) hours as needed for muscle spasms. 90 tablet 1   mycophenolate (CELLCEPT) 500 MG tablet Take 3 tablets (1,500 mg total) by mouth 2 (two) times daily. Hold this meds, resume when ok with your rheumatologist     ondansetron (ZOFRAN) 8 MG tablet Take 1 tablet (8 mg total) by mouth every 8 (eight) hours as needed for nausea or vomiting. 30 tablet 2   SUMAtriptan (IMITREX) 100 MG tablet Take 1 tablet (100 mg total) by mouth every 2 (two) hours as needed for migraine. May repeat in 2 hours if headache persists or recurs.  Max 200 mg daily 10 tablet 0   SYMBICORT 160-4.5 MCG/ACT inhaler INHALE 2 PUFFS INTO THE LUNGS IN THE MORNING AND AT BEDTIME. 10.2 each 5   topiramate (TOPAMAX) 25 MG tablet Take 1 tablet (25 mg total) by mouth 2 (two) times daily. For headache prevention 180 tablet 0   No current facility-administered medications on file prior to visit.    Allergies:   Allergies  Allergen Reactions   Albuterol Shortness Of Breath and Other (See Comments)    Tolerates HFA inhaler, nebulizer treatment caused O2 drop   Other Anaphylaxis    Essential Oil, scent unknown, caused tongue swelling   Truxima [Rituximab] Anaphylaxis   Sulfa Antibiotics Hives   Vancomycin Itching    Scalp itching - felt like it was on fire      OBJECTIVE:  Physical Exam  There were no vitals filed for this visit. There is no height or  weight on file to calculate BMI. No results found.   General: well developed, well nourished, seated, in no evident distress Head: head normocephalic and atraumatic.   Neck: supple with no carotid or supraclavicular bruits Cardiovascular: regular rate and rhythm, no murmurs Musculoskeletal: no deformity Skin:  no rash/petichiae Vascular:  Normal pulses all extremities   Neurologic Exam Mental Status: Awake and fully alert. Oriented to place and time. Recent and remote memory intact. Attention span, concentration and fund of knowledge appropriate. Mood and affect appropriate.  Cranial Nerves: Pupils equal, briskly reactive to light. Extraocular movements full without nystagmus. Visual fields full to confrontation. Hearing intact. Facial sensation intact. Face, tongue, palate moves normally and symmetrically.  Motor: Normal bulk and tone. Normal strength in all tested extremity muscles Sensory.: intact to touch , pinprick , position and vibratory sensation.  Coordination: Rapid alternating movements normal in all extremities. Finger-to-nose and heel-to-shin performed accurately bilaterally. Gait and Station: Arises from chair without difficulty. Stance is normal. Gait demonstrates normal stride length and balance without use of AD. Tandem walk and heel toe without difficulty.  Reflexes: 1+ and symmetric. Toes downgoing.         ASSESSMENT/PLAN: Diane Wells is a 42 y.o. year old female        Follow up in *** or call earlier if needed   CC:  PCP: Copland, Gay Filler, MD    I spent *** minutes of face-to-face and non-face-to-face time with patient.  This included previsit chart review, lab review, study review, order entry, electronic health record documentation, patient education regarding ***   Frann Rider, AGNP-BC  [facility] [address] [place], [place] [address]  Phone [phone] Fax [phone] Note: This document was  prepared with digital dictation and possible smart phrase technology. Any transcriptional errors that result from this process are unintentional.

## 2022-04-19 ENCOUNTER — Ambulatory Visit: Payer: BC Managed Care – PPO | Admitting: Adult Health

## 2022-04-19 ENCOUNTER — Encounter: Payer: Self-pay | Admitting: Family Medicine

## 2022-04-19 ENCOUNTER — Other Ambulatory Visit: Payer: Self-pay | Admitting: Family Medicine

## 2022-04-19 DIAGNOSIS — M5090 Cervical disc disorder, unspecified, unspecified cervical region: Secondary | ICD-10-CM

## 2022-04-19 DIAGNOSIS — M62838 Other muscle spasm: Secondary | ICD-10-CM

## 2022-04-19 DIAGNOSIS — G5691 Unspecified mononeuropathy of right upper limb: Secondary | ICD-10-CM

## 2022-04-19 MED ORDER — AMOXICILLIN-POT CLAVULANATE 875-125 MG PO TABS: 1.0000 | ORAL_TABLET | Freq: Two times a day (BID) | ORAL | 0 refills | Status: DC

## 2022-04-19 NOTE — Addendum Note (Signed)
Addended by: Darreld Mclean on: 04/19/2022 04:37 PM   Modules accepted: Orders

## 2022-04-26 ENCOUNTER — Ambulatory Visit
Admission: RE | Admit: 2022-04-26 | Discharge: 2022-04-26 | Disposition: A | Discharge status: Home / Self Care | Payer: BC Managed Care – PPO | Source: Ambulatory Visit | Attending: Family Medicine | Admitting: Family Medicine

## 2022-04-26 DIAGNOSIS — R1013 Epigastric pain: Secondary | ICD-10-CM | POA: Diagnosis not present

## 2022-04-26 DIAGNOSIS — K8020 Calculus of gallbladder without cholecystitis without obstruction: Secondary | ICD-10-CM | POA: Diagnosis not present

## 2022-04-27 ENCOUNTER — Other Ambulatory Visit: Payer: Self-pay | Admitting: Family Medicine

## 2022-04-27 ENCOUNTER — Encounter: Payer: Self-pay | Admitting: Family Medicine

## 2022-04-27 DIAGNOSIS — N179 Acute kidney failure, unspecified: Secondary | ICD-10-CM | POA: Diagnosis not present

## 2022-04-27 DIAGNOSIS — I10 Essential (primary) hypertension: Secondary | ICD-10-CM | POA: Diagnosis not present

## 2022-04-27 DIAGNOSIS — M3130 Wegener's granulomatosis without renal involvement: Secondary | ICD-10-CM | POA: Diagnosis not present

## 2022-04-27 DIAGNOSIS — R1013 Epigastric pain: Secondary | ICD-10-CM

## 2022-04-27 DIAGNOSIS — Z1231 Encounter for screening mammogram for malignant neoplasm of breast: Secondary | ICD-10-CM

## 2022-04-27 DIAGNOSIS — R809 Proteinuria, unspecified: Secondary | ICD-10-CM | POA: Diagnosis not present

## 2022-05-04 ENCOUNTER — Other Ambulatory Visit: Payer: Self-pay | Admitting: Family Medicine

## 2022-05-04 DIAGNOSIS — M5090 Cervical disc disorder, unspecified, unspecified cervical region: Secondary | ICD-10-CM

## 2022-05-04 DIAGNOSIS — G5691 Unspecified mononeuropathy of right upper limb: Secondary | ICD-10-CM

## 2022-05-08 ENCOUNTER — Other Ambulatory Visit: Payer: Self-pay | Admitting: Internal Medicine

## 2022-05-08 DIAGNOSIS — E041 Nontoxic single thyroid nodule: Secondary | ICD-10-CM

## 2022-05-22 ENCOUNTER — Other Ambulatory Visit: Payer: Self-pay | Admitting: Family Medicine

## 2022-05-29 ENCOUNTER — Encounter: Payer: Self-pay | Admitting: Internal Medicine

## 2022-05-29 ENCOUNTER — Ambulatory Visit: Payer: BC Managed Care – PPO | Admitting: Internal Medicine

## 2022-05-29 VITALS — BP 130/76 | HR 91 | Temp 98.3°F | Ht 66.0 in | Wt 313.0 lb

## 2022-05-29 DIAGNOSIS — J4550 Severe persistent asthma, uncomplicated: Secondary | ICD-10-CM

## 2022-05-29 DIAGNOSIS — Z23 Encounter for immunization: Secondary | ICD-10-CM | POA: Diagnosis not present

## 2022-05-29 DIAGNOSIS — M3131 Wegener's granulomatosis with renal involvement: Secondary | ICD-10-CM | POA: Diagnosis not present

## 2022-05-29 LAB — POCT EXHALED NITRIC OXIDE: FeNO level (ppb): 16

## 2022-05-29 MED ORDER — MOMETASONE FURO-FORMOTEROL FUM 200-5 MCG/ACT IN AERO: 2.0000 | INHALATION_SPRAY | Freq: Two times a day (BID) | RESPIRATORY_TRACT | 5 refills | Status: DC

## 2022-05-29 NOTE — Patient Instructions (Addendum)
Please schedule follow up scheduled with myself in 3 months.  If my schedule is not open yet, we will contact you with a reminder closer to that time. Please call [phone] if you haven't heard from [place] a month before.   Will increase your steroid inhaler. Stop symbicort.  Switch to dulera 200 2 puffs twice a day. Gargle after use. Continue albuterol inhaler as needed.   Will obtain CT sinuses to rule out involvement of GPA given recurrent inflammation/infection. We will call you to schedule this.  I will call you with results.

## 2022-05-29 NOTE — Progress Notes (Signed)
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  I have detailed her asthma history below.  Her more recent history begins in the last year when she developed arthralgias and neuropathy as well as recurrent sinus issues in early 2020.    GPA history: Dr. Trudie Reed at [place] [facility] Dr. Rexene Alberts in Neurology Dr. 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 Johnney Ou. Biopsy deferred due to presumptive diagnosis ultrasound findings, and proteinuria and hematuria.  Interval Updates: Here for follow up after one year On cell cept 1500 mg BID for GPA, plaquenil 200 mg daily for RA. tapered Prednisone and ended up having adrenal insufficiency - now on hydrocortisone 5 mg daily.   Since December having worsening eye issues - concerned about GPA flare. For the past two months taking albuterol almost daily - otherwise hadn't taken it for 4-5 months. She does feel albuterol helps for a few hours. Symptoms come on with rest and exertion. Symptoms are cough, shortness of breath. Has had three sinus infections in this interval and has been treated with augmentin and doxycycline by PCP. Hasn't seen ENT in 3 years. Has had recurrent sinus issues over the past 2-3 years.   Current Regimen: Symbicort 160 2 puffs BID. Albuterol MDI (allergic to nebulizer solution) using 2-3 times/week.  Asthma Triggers:seasonal and environmental allergies Exacerbations in the last year: 0 for asthma in 2022. History of hospitalization or intubation: none, ED visit in Feb 2021 Hives: anaphylaxis  Allergy Testing: Had SPT testing 2018 years  ago: grass, tree, mold, feather, cat dust mites. Wasn't able to keep up with [facility] in the past due to schedules  GERD: denies Allergic Rhinitis: yes ACT:  Asthma Control Test ACT Total Score  05/29/2022 11:03 AM 14  05/05/2021  3:35 PM 15  05/13/2019  9:00 AM 18   FeNO: 24 ppb while on high dose prednisone previously - 16 ppb on 05/29/22.   I have reviewed the patient's family social and past medical history and updated as appropriate.   Past Medical History:  Diagnosis Date   Adrenal insufficiency (HCC)    Allergic rhinitis    Asthma    Astigmatism    Chronic kidney disease    Cushing's disease (HCC)    idiopathic   Granulomatosis with polyangiitis ([place])    Hypertension    Neutropenia ([place])    hospitalized in early 2023 for 9 days   Recurrent upper respiratory infection (URI)     Past Surgical History:  Procedure Laterality Date   ADENOIDECTOMY     ANTERIOR CERVICAL DECOMP/DISCECTOMY FUSION      Family History  Problem Relation Age of Onset   Diabetes Mother        Type 1 Diabetes, juvenile onset   Hypertension Mother    Pulmonary Hypertension Mother    Hyperthyroidism Mother    Glaucoma Mother    Neuropathy Mother    Sjogren's syndrome Mother    Carpal tunnel syndrome Mother    Hypertension Father    Gallbladder disease Father    Hypertension Maternal Grandmother    Glaucoma Maternal  Grandmother    Coronary artery disease Maternal Grandmother    Heart attack Maternal Grandmother    Angina Maternal Grandmother    Macular degeneration Maternal Grandmother    COPD Maternal Grandmother    Congestive Heart Failure Maternal Grandmother    Glaucoma Maternal Grandfather    Leukemia Paternal Grandmother    Hypertension Paternal Grandmother    Colon cancer Paternal Grandfather    Diabetes Maternal Uncle    Heart attack Maternal Uncle    Stroke Maternal Uncle    Allergic rhinitis Neg Hx    Angioedema Neg Hx    Asthma Neg Hx    Eczema Neg Hx    Pseudotumor cerebri  Neg Hx    Migraines Neg Hx     Social History   Occupational History   Not on file  Tobacco Use   Smoking status: Never   Smokeless tobacco: Never  Vaping Use   Vaping Use: Never used  Substance and Sexual Activity   Alcohol use: Not Currently    Comment: was rare   Drug use: No   Sexual activity: Not Currently    Birth control/protection: None     Physical Exam: Blood pressure 130/76, pulse 91, temperature 98.3 F (36.8 C), temperature source Oral, height '5\' 6"'$  (1.676 m), weight (!) 313 lb (142 kg), SpO2 99 %.  Gen:      No acute distress.  Lungs:    CTAB no wheezes or crackles CV:         RRR no mrg MSK:     No rashes  Data Reviewed: Imaging: I have personally reviewed the chest xray July 2021 no acute cardiopulmonary process  PFTs:     Latest Ref Rng & Units 06/16/2020   10:55 AM 06/03/2019    2:57 PM  PFT Results  FVC-Pre L 3.38  3.87   FVC-Predicted Pre % 85  97   FVC-Post L 2.92  3.42   FVC-Predicted Post % 73  86   Pre FEV1/FVC % % 86  84   Post FEV1/FCV % % 91  88   FEV1-Pre L 2.92  3.27   FEV1-Predicted Pre % 90  100   FEV1-Post L 2.65  3.00   DLCO uncorrected ml/min/mmHg  18.88   DLCO UNC% %  80   DLCO corrected ml/min/mmHg  20.57   DLCO COR %Predicted %  87   DLVA Predicted %  95   TLC L  5.28   TLC % Predicted %  98   RV % Predicted %  86    I have personally reviewed the patient's PFTs and they are without airflow limitation, have normal lung volumes, and normal diffusion capacity. No significant BD response. Feno is 24ppb which is not elevated in march 2022.   Labs: Lab Results  Component Value Date   WBC 22.5 (H) 06/15/2021   HGB 12.0 06/15/2021   HCT 37.5 06/15/2021   MCV 84.3 06/15/2021   PLT 101 (L) 06/15/2021   Lab Results  Component Value Date   NA 136 06/15/2021   K 4.4 06/15/2021   CL 102 06/15/2021   CO2 25 06/15/2021   Cr March 2023 - 0.99   Immunization status: Immunization History  Administered Date(s)  Administered   Influenza Split 12/25/2020   Influenza,inj,Quad PF,6+ Mos 01/10/2016, 05/16/2018, 01/28/2019, 01/01/2020, 05/29/2022   Influenza-Unspecified 12/25/2020   Moderna Sars-Covid-2 Vaccination 11/22/2019, 12/09/2019, 11/21/2020, 12/18/2020   PFIZER Comirnaty(Gray Top)Covid-19 Tri-Sucrose Vaccine 03/22/2020   PFIZER(Purple Top)SARS-COV-2 Vaccination 03/22/2020  Td 02/24/2016   Td (Adult),5 Lf Tetanus Toxid, Preservative Free 02/24/2016   Tdap 03/27/2005    Assessment:  Granulomatosis with Polyangiitis, 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  Severe Persistent Allergic Asthma Allergic Rhinitis Nasal congestion with recurrent sinus infections  Plan/Recommendations: Continue Symbicort 2 puffs BID. Feno was not elevated today only 16 ppb. Can try an increase in symbicort to dulera since she perceives benefit. If no improvement can add a LAMA.  Spirometry machine down today will repeat spiro at next visit.  Continue prn albuterol  Continue fluticasone Will obtain CT sinuses to rule out involvement of GPA given recurrent inflammation/infection.   Flu shot today.   Return to Care: Return in about 3 months (around 08/29/2022).   Lenice Llamas, MD Pulmonary and [place]

## 2022-05-30 ENCOUNTER — Ambulatory Visit
Admission: RE | Admit: 2022-05-30 | Discharge: 2022-05-30 | Disposition: A | Discharge status: Home / Self Care | Payer: BC Managed Care – PPO | Source: Ambulatory Visit | Attending: Internal Medicine | Admitting: Internal Medicine

## 2022-05-30 DIAGNOSIS — E041 Nontoxic single thyroid nodule: Secondary | ICD-10-CM

## 2022-06-01 ENCOUNTER — Ambulatory Visit
Admission: RE | Admit: 2022-06-01 | Discharge: 2022-06-01 | Disposition: A | Discharge status: Home / Self Care | Payer: BC Managed Care – PPO | Source: Ambulatory Visit | Attending: Internal Medicine | Admitting: Internal Medicine

## 2022-06-01 DIAGNOSIS — E2749 Other adrenocortical insufficiency: Secondary | ICD-10-CM | POA: Diagnosis not present

## 2022-06-01 DIAGNOSIS — J322 Chronic ethmoidal sinusitis: Secondary | ICD-10-CM | POA: Diagnosis not present

## 2022-06-01 DIAGNOSIS — R490 Dysphonia: Secondary | ICD-10-CM | POA: Diagnosis not present

## 2022-06-01 DIAGNOSIS — E242 Drug-induced Cushing's syndrome: Secondary | ICD-10-CM | POA: Diagnosis not present

## 2022-06-01 DIAGNOSIS — M3131 Wegener's granulomatosis with renal involvement: Secondary | ICD-10-CM

## 2022-06-01 DIAGNOSIS — E041 Nontoxic single thyroid nodule: Secondary | ICD-10-CM | POA: Diagnosis not present

## 2022-06-01 DIAGNOSIS — Z872 Personal history of diseases of the skin and subcutaneous tissue: Secondary | ICD-10-CM | POA: Diagnosis not present

## 2022-06-01 MED ORDER — IOPAMIDOL (ISOVUE-300) INJECTION 61%
75.0000 mL | Freq: Once | INTRAVENOUS | Status: AC | PRN
  Administered 2022-06-01: 75 mL via INTRAVENOUS

## 2022-06-02 ENCOUNTER — Other Ambulatory Visit: Payer: Self-pay | Admitting: Internal Medicine

## 2022-06-02 DIAGNOSIS — E041 Nontoxic single thyroid nodule: Secondary | ICD-10-CM

## 2022-06-16 ENCOUNTER — Ambulatory Visit
Admission: RE | Admit: 2022-06-16 | Discharge: 2022-06-16 | Disposition: A | Discharge status: Home / Self Care | Payer: BC Managed Care – PPO | Source: Ambulatory Visit | Attending: Family Medicine | Admitting: Family Medicine

## 2022-06-16 DIAGNOSIS — Z1231 Encounter for screening mammogram for malignant neoplasm of breast: Secondary | ICD-10-CM

## 2022-06-19 DIAGNOSIS — R1013 Epigastric pain: Secondary | ICD-10-CM | POA: Diagnosis not present

## 2022-06-19 DIAGNOSIS — R1310 Dysphagia, unspecified: Secondary | ICD-10-CM | POA: Diagnosis not present

## 2022-06-19 DIAGNOSIS — R194 Change in bowel habit: Secondary | ICD-10-CM | POA: Diagnosis not present

## 2022-06-19 DIAGNOSIS — R197 Diarrhea, unspecified: Secondary | ICD-10-CM | POA: Diagnosis not present

## 2022-06-20 ENCOUNTER — Other Ambulatory Visit: Payer: Self-pay | Admitting: Family Medicine

## 2022-06-20 DIAGNOSIS — R928 Other abnormal and inconclusive findings on diagnostic imaging of breast: Secondary | ICD-10-CM

## 2022-06-22 ENCOUNTER — Ambulatory Visit (INDEPENDENT_AMBULATORY_CARE_PROVIDER_SITE_OTHER): Payer: BC Managed Care – PPO | Admitting: Family Medicine

## 2022-06-22 ENCOUNTER — Encounter: Payer: Self-pay | Admitting: Family Medicine

## 2022-06-22 VITALS — BP 162/80 | HR 87 | Temp 97.9°F | Resp 18 | Ht 67.0 in | Wt 316.4 lb

## 2022-06-22 DIAGNOSIS — R350 Frequency of micturition: Secondary | ICD-10-CM | POA: Diagnosis not present

## 2022-06-22 LAB — POCT URINALYSIS DIPSTICK
Bilirubin, UA: NEGATIVE
Blood, UA: NEGATIVE
Glucose, UA: NEGATIVE
Ketones, UA: NEGATIVE
Leukocytes, UA: NEGATIVE
Nitrite, UA: NEGATIVE
Protein, UA: NEGATIVE
Spec Grav, UA: 1.01 (ref 1.010–1.025)
Urobilinogen, UA: 0.2 E.U./dL
pH, UA: 6 (ref 5.0–8.0)

## 2022-06-22 LAB — POCT URINE PREGNANCY: Preg Test, Ur: NEGATIVE

## 2022-06-22 MED ORDER — PHENAZOPYRIDINE HCL 200 MG PO TABS: 200.0000 mg | ORAL_TABLET | Freq: Three times a day (TID) | ORAL | 0 refills | Status: DC | PRN

## 2022-06-22 NOTE — Progress Notes (Signed)
Acute Office Visit  Subjective:     Patient ID: Diane Wells, female    DOB: Jun 13, 1980, 42 y.o.   MRN: [id number]  Chief Complaint  Patient presents with   Recurrent UTI    Low abdominal and back pain started last week. Tuesday she started having some urgency.     HPI   Urinary Tract Infection: Patient complains of  suprapubic pressure, frequency, urgency, low back pain, generalized lower abdominal discomfort  She has had symptoms for 1 week. Patient denies fever and vaginal discharge. Patient does not have a history of recurrent UTI.  Patient does not have a history of pyelonephritis. Reports sensation almost feels like period cramps, but she shouldn't be starting until middle of next week. Reports history of ovarian cyst, but this does not feel the same. No new GI symptoms - has been struggling with nausea, vomiting, bowel changes and is following with GI - endoscopy/colonoscopy in May.     ROS All review of systems negative except what is listed in the HPI      Objective:    BP (!) 162/80   Pulse 87   Temp 97.9 F (36.6 C) (Oral)   Resp 18   Ht 5\' 7"  (1.702 m)   Wt (!) 316 lb 6.4 oz (143.5 kg)   SpO2 100%   BMI 49.56 kg/m    Physical Exam Vitals reviewed.  Constitutional:      General: She is not in acute distress.    Appearance: Normal appearance. She is obese. She is not ill-appearing.  Cardiovascular:     Rate and Rhythm: Normal rate and regular rhythm.     Pulses: Normal pulses.     Heart sounds: Normal heart sounds.  Pulmonary:     Effort: Pulmonary effort is normal.     Breath sounds: Normal breath sounds.  Abdominal:     General: Abdomen is flat. Bowel sounds are normal.     Palpations: Abdomen is soft.     Tenderness: There is no right CVA tenderness or left CVA tenderness.  Skin:    General: Skin is warm and dry.  Neurological:     Mental Status: She is alert and oriented to person, place, and time.  Psychiatric:        Mood and Affect:  Mood normal.        Behavior: Behavior normal.        Thought Content: Thought content normal.        Judgment: Judgment normal.     Results for orders placed or performed in visit on 06/22/22  POCT Urinalysis Dipstick  Result Value Ref Range   Color, UA yellow    Clarity, UA clear    Glucose, UA Negative Negative   Bilirubin, UA negative    Ketones, UA negative    Spec Grav, UA 1.010 1.010 - 1.025   Blood, UA negative    pH, UA 6.0 5.0 - 8.0   Protein, UA Negative Negative   Urobilinogen, UA 0.2 0.2 or 1.0 E.U./dL   Nitrite, UA negative    Leukocytes, UA Negative Negative   Appearance clear    Odor none   POCT urine pregnancy  Result Value Ref Range   Preg Test, Ur Negative Negative        Assessment & Plan:   Problem List Items Addressed This Visit   None Visit Diagnoses     Urinary frequency    -  Primary Urine was normal today. I  will send for culture - if positive, we will start antibiotics.  Pyridium will turn your urine orange.  Make sure you are staying well hydrated with water.  Avoid bladder irritants - spicy foods, citrus, caffeine, carbonated beverages, chocolate Given the lower abdominal discomfort, offered imaging (history of ovarian cyst, but states this feels different and more vague) she declined imaging and pelvic exam at this time. Will wait for culture and see how she does over the next few days. Due to start menstruation next week. Patient aware of signs/symptoms requiring further/urgent evaluation.  Pregnancy test negative     Relevant Medications   phenazopyridine (PYRIDIUM) 200 MG tablet   Other Relevant Orders   POCT Urinalysis Dipstick (Completed)   Urine Culture POCT urine pregnancy       Meds ordered this encounter  Medications   phenazopyridine (PYRIDIUM) 200 MG tablet    Sig: Take 1 tablet (200 mg total) by mouth 3 (three) times daily as needed for pain.    Dispense:  10 tablet    Refill:  0    Order Specific Question:    Supervising Provider    Answer:   Penni Homans A [[id number]]    Return if symptoms worsen or fail to improve.  Terrilyn Saver, NP

## 2022-06-22 NOTE — Patient Instructions (Addendum)
Urine was normal today. I will send for culture - if positive, we will start antibiotics.  Pyridium will turn your urine orange.  Make sure you are staying well hydrated with water.  Avoid bladder irritants - spicy foods, citrus, caffeine, carbonated beverages, chocolate

## 2022-06-23 LAB — URINE CULTURE
MICRO NUMBER:: 14754951
SPECIMEN QUALITY:: ADEQUATE

## 2022-06-26 ENCOUNTER — Encounter: Payer: Self-pay | Admitting: Family Medicine

## 2022-06-26 DIAGNOSIS — R102 Pelvic and perineal pain: Secondary | ICD-10-CM

## 2022-06-28 ENCOUNTER — Ambulatory Visit
Admission: RE | Admit: 2022-06-28 | Discharge: 2022-06-28 | Disposition: A | Discharge status: Home / Self Care | Payer: BC Managed Care – PPO | Source: Ambulatory Visit | Attending: Family Medicine | Admitting: Family Medicine

## 2022-06-28 DIAGNOSIS — R928 Other abnormal and inconclusive findings on diagnostic imaging of breast: Secondary | ICD-10-CM

## 2022-06-28 DIAGNOSIS — N6011 Diffuse cystic mastopathy of right breast: Secondary | ICD-10-CM | POA: Diagnosis not present

## 2022-07-03 ENCOUNTER — Other Ambulatory Visit (HOSPITAL_COMMUNITY)
Admission: RE | Admit: 2022-07-03 | Discharge: 2022-07-03 | Disposition: A | Discharge status: Home / Self Care | Payer: BC Managed Care – PPO | Source: Ambulatory Visit | Attending: Internal Medicine | Admitting: Internal Medicine

## 2022-07-03 ENCOUNTER — Ambulatory Visit
Admission: RE | Admit: 2022-07-03 | Discharge: 2022-07-03 | Disposition: A | Discharge status: Home / Self Care | Payer: BC Managed Care – PPO | Source: Ambulatory Visit | Attending: Internal Medicine | Admitting: Internal Medicine

## 2022-07-03 DIAGNOSIS — E041 Nontoxic single thyroid nodule: Secondary | ICD-10-CM

## 2022-07-05 DIAGNOSIS — R809 Proteinuria, unspecified: Secondary | ICD-10-CM | POA: Diagnosis not present

## 2022-07-05 DIAGNOSIS — M3130 Wegener's granulomatosis without renal involvement: Secondary | ICD-10-CM | POA: Diagnosis not present

## 2022-07-05 DIAGNOSIS — H15009 Unspecified scleritis, unspecified eye: Secondary | ICD-10-CM | POA: Diagnosis not present

## 2022-07-05 DIAGNOSIS — M0579 Rheumatoid arthritis with rheumatoid factor of multiple sites without organ or systems involvement: Secondary | ICD-10-CM | POA: Diagnosis not present

## 2022-07-05 LAB — CYTOLOGY - NON PAP

## 2022-07-19 ENCOUNTER — Ambulatory Visit
Admission: RE | Admit: 2022-07-19 | Discharge: 2022-07-19 | Disposition: A | Discharge status: Home / Self Care | Payer: BC Managed Care – PPO | Source: Ambulatory Visit | Attending: Family Medicine | Admitting: Family Medicine

## 2022-07-19 DIAGNOSIS — R102 Pelvic and perineal pain: Secondary | ICD-10-CM | POA: Diagnosis not present

## 2022-08-08 ENCOUNTER — Other Ambulatory Visit: Payer: Self-pay | Admitting: Family Medicine

## 2022-08-08 DIAGNOSIS — M62838 Other muscle spasm: Secondary | ICD-10-CM

## 2022-08-08 DIAGNOSIS — M5090 Cervical disc disorder, unspecified, unspecified cervical region: Secondary | ICD-10-CM

## 2022-08-08 DIAGNOSIS — G5691 Unspecified mononeuropathy of right upper limb: Secondary | ICD-10-CM

## 2022-08-10 DIAGNOSIS — E242 Drug-induced Cushing's syndrome: Secondary | ICD-10-CM | POA: Diagnosis not present

## 2022-08-10 DIAGNOSIS — L659 Nonscarring hair loss, unspecified: Secondary | ICD-10-CM | POA: Diagnosis not present

## 2022-08-10 DIAGNOSIS — E041 Nontoxic single thyroid nodule: Secondary | ICD-10-CM | POA: Diagnosis not present

## 2022-08-10 DIAGNOSIS — E2749 Other adrenocortical insufficiency: Secondary | ICD-10-CM | POA: Diagnosis not present

## 2022-10-12 DIAGNOSIS — M3130 Wegener's granulomatosis without renal involvement: Secondary | ICD-10-CM | POA: Diagnosis not present

## 2022-10-12 DIAGNOSIS — H15009 Unspecified scleritis, unspecified eye: Secondary | ICD-10-CM | POA: Diagnosis not present

## 2022-10-12 DIAGNOSIS — R809 Proteinuria, unspecified: Secondary | ICD-10-CM | POA: Diagnosis not present

## 2022-10-12 DIAGNOSIS — M0579 Rheumatoid arthritis with rheumatoid factor of multiple sites without organ or systems involvement: Secondary | ICD-10-CM | POA: Diagnosis not present

## 2022-10-12 DIAGNOSIS — M25561 Pain in right knee: Secondary | ICD-10-CM | POA: Diagnosis not present

## 2022-10-13 DIAGNOSIS — E242 Drug-induced Cushing's syndrome: Secondary | ICD-10-CM | POA: Diagnosis not present

## 2022-10-13 DIAGNOSIS — E041 Nontoxic single thyroid nodule: Secondary | ICD-10-CM | POA: Diagnosis not present

## 2022-10-13 DIAGNOSIS — L659 Nonscarring hair loss, unspecified: Secondary | ICD-10-CM | POA: Diagnosis not present

## 2022-10-13 DIAGNOSIS — E2749 Other adrenocortical insufficiency: Secondary | ICD-10-CM | POA: Diagnosis not present

## 2022-10-25 DIAGNOSIS — L638 Other alopecia areata: Secondary | ICD-10-CM | POA: Diagnosis not present

## 2022-11-07 DIAGNOSIS — L814 Other melanin hyperpigmentation: Secondary | ICD-10-CM | POA: Diagnosis not present

## 2022-11-07 DIAGNOSIS — D485 Neoplasm of uncertain behavior of skin: Secondary | ICD-10-CM | POA: Diagnosis not present

## 2022-11-07 DIAGNOSIS — L818 Other specified disorders of pigmentation: Secondary | ICD-10-CM | POA: Diagnosis not present

## 2022-11-07 DIAGNOSIS — D225 Melanocytic nevi of trunk: Secondary | ICD-10-CM | POA: Diagnosis not present

## 2022-11-07 DIAGNOSIS — L821 Other seborrheic keratosis: Secondary | ICD-10-CM | POA: Diagnosis not present

## 2022-11-21 ENCOUNTER — Other Ambulatory Visit: Payer: Self-pay | Admitting: Family Medicine

## 2022-11-21 DIAGNOSIS — M5090 Cervical disc disorder, unspecified, unspecified cervical region: Secondary | ICD-10-CM

## 2022-11-21 DIAGNOSIS — G5691 Unspecified mononeuropathy of right upper limb: Secondary | ICD-10-CM

## 2022-11-21 DIAGNOSIS — M62838 Other muscle spasm: Secondary | ICD-10-CM

## 2022-11-27 NOTE — Progress Notes (Unsigned)
[facility] [address] [place], [place] [address] [phone]  Date:  11/29/2022   Name:  Diane Wells   DOB:  09-29-1980   MRN:  [id number]  PCP:  Pearline Cables, MD    Chief Complaint: No chief complaint on file.   History of Present Illness:  Diane Wells is a 42 y.o. very pleasant female patient who presents with the following:  Pt seen today to discuss her headaches- most recent visit with myself was in January History of asthma, allergies, obesity, hypertension Also, in 2021 she was dx with granulomatosis with polyangitis which has been incredibly complicated and affected multiple organ systems, as well as rheumatoid arthritis She has renal, cutaneous, potentially pulmonary involvement of her disease  She has multiple specialists  Dr. Nickola Major at [facility] Dr. Frances Furbish in Neurology Dr. Celine Mans Pulmonlogy Ophthalomology - diagnosed with episcleritis and is on topical eye drops Nephrology - Dr. Glenna Fellows. Biopsy deferred due to presumptive diagnosis ultrasound findings, and proteinuria and hematuria.  Seen by pulmonology- Dr Celine Mans- in March: Here for follow up after one year On cell cept 1500 mg BID for GPA, plaquenil 200 mg daily for RA. tapered Prednisone and ended up having adrenal insufficiency - now on hydrocortisone 5 mg daily.  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  Severe Persistent Allergic Asthma Allergic Rhinitis Nasal congestion with recurrent sinus infections Plan/Recommendations: Continue Symbicort 2 puffs BID. Feno was not elevated today only 16 ppb. Can try an increase in symbicort to dulera since she perceives benefit. If no improvement can add a LAMA.  Spirometry machine down today will repeat spiro at next visit.  Continue prn albuterol Continue fluticasone Will obtain CT sinuses to rule out  involvement of GPA given recurrent inflammation/infection.        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   Medication management 05/27/2019   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   Patellar dislocation 01/17/2018   Acute left ankle pain 11/27/2017   Allergic reaction 02/15/2016   Moderate persistent asthma without complication 02/15/2016   Perennial and seasonal allergic rhinitis 02/15/2016   Allergic conjunctivitis 02/15/2016   Obesity, unspecified obesity severity, unspecified obesity type 01/10/2016    Past Medical History:  Diagnosis Date   Adrenal insufficiency (HCC)    Allergic rhinitis    Asthma    Astigmatism    Chronic kidney disease    Cushing's disease (HCC)    idiopathic   Granulomatosis with polyangiitis (HCC)    Hypertension    Neutropenia (HCC)    hospitalized in early 2023 for 9 days   Recurrent upper respiratory infection (URI)     Past Surgical History:  Procedure Laterality Date   ADENOIDECTOMY     ANTERIOR CERVICAL DECOMP/DISCECTOMY FUSION      Social History   Tobacco Use   Smoking status: Never   Smokeless tobacco: Never  Vaping Use   Vaping status: Never Used  Substance Use Topics   Alcohol use: Not Currently    Comment: was rare   Drug use: No    Family History  Problem Relation Age of Onset   Diabetes Mother        Type 1 Diabetes, juvenile onset   Hypertension Mother    Pulmonary Hypertension  Mother    Hyperthyroidism Mother    Glaucoma Mother    Neuropathy Mother    Sjogren's syndrome Mother    Carpal tunnel syndrome Mother    Hypertension Father    Gallbladder disease Father    Breast cancer Sister 3       metastic IBC   Diabetes Maternal Uncle    Heart attack Maternal Uncle    Stroke Maternal Uncle    Hypertension Maternal Grandmother    Glaucoma Maternal Grandmother    Coronary artery disease Maternal Grandmother    Heart attack Maternal Grandmother    Angina Maternal Grandmother    Macular degeneration Maternal Grandmother    COPD Maternal Grandmother    Congestive Heart Failure Maternal Grandmother    Glaucoma Maternal Grandfather    Leukemia Paternal Grandmother    Hypertension Paternal Grandmother    Colon cancer Paternal Grandfather    Allergic rhinitis Neg Hx    Angioedema Neg Hx    Asthma Neg Hx    Eczema Neg Hx    Pseudotumor cerebri Neg Hx    Migraines Neg Hx     Allergies  Allergen Reactions   Albuterol Shortness Of Breath and Other (See Comments)    Tolerates HFA inhaler, nebulizer treatment caused O2 drop   Other Anaphylaxis    Essential Oil, scent unknown, caused tongue swelling   Truxima [Rituximab] Anaphylaxis   Sulfa Antibiotics Hives   Vancomycin Itching    Scalp itching - felt like it was on fire    Medication list has been reviewed and updated.  Current Outpatient Medications on File Prior to Visit  Medication Sig Dispense Refill   albuterol (VENTOLIN HFA) 108 (90 Base) MCG/ACT inhaler INHALE 1-2 PUFFS INTO THE LUNGS DAILY AS NEEDED FOR SHORTNESS OF BREATH. DAW 18 each 2   butalbital-acetaminophen-caffeine (FIORICET) 50-325-40 MG tablet TAKE 1 TABLET BY MOUTH EVERY 6 HOURS AS NEEDED FOR HEADACHE. 60 tablet 0   EPINEPHrine 0.3 mg/0.3 mL IJ SOAJ injection Inject 0.3 mg into the muscle once as needed for anaphylaxis.     fexofenadine (ALLEGRA) 180 MG tablet Take 180 mg by mouth every morning.     fluticasone (FLONASE) 50 MCG/ACT  nasal spray Place 2 sprays into both nostrils in the morning and at bedtime. 16 g 11   furosemide (LASIX) 20 MG tablet TAKE 1 (ONE) TABLET DAILY AS NEEDED (Patient taking differently: Take 20-40 mg by mouth daily as needed (swelling).) 90 tablet 0   gabapentin (NEURONTIN) 300 MG capsule Take 1-2 capsules three times a day as needed 270 capsule 1   hydrocortisone (CORTEF) 10 MG tablet Take 5 mg by mouth every morning.     hydroxychloroquine (PLAQUENIL) 200 MG tablet Take 1 tablet (200 mg total) by mouth 2 (two) times daily. Hold until ok with your rheumatologist     ketorolac (ACULAR) 0.5 % ophthalmic solution 1 drop See admin instructions. Apply one drop into left eye daily at bedtime, may also use up to three more times daily in affected eye(s) as needed for scleritis     losartan (COZAAR) 25 MG tablet TAKE 3 TABLETS BY MOUTH DAILY (Patient taking differently: Take 75  mg by mouth at bedtime.) 270 tablet 1   methocarbamol (ROBAXIN) 750 MG tablet TAKE 1 TABLET (750 MG TOTAL) BY MOUTH EVERY 8 (EIGHT) HOURS AS NEEDED FOR MUSCLE SPASMS. 90 tablet 1   mometasone-formoterol (DULERA) 200-5 MCG/ACT AERO Inhale 2 puffs into the lungs in the morning and at bedtime. 1 each 5   mycophenolate (CELLCEPT) 500 MG tablet Take 3 tablets (1,500 mg total) by mouth 2 (two) times daily. Hold this meds, resume when ok with your rheumatologist     ondansetron (ZOFRAN) 8 MG tablet Take 1 tablet (8 mg total) by mouth every 8 (eight) hours as needed for nausea or vomiting. 30 tablet 2   phenazopyridine (PYRIDIUM) 200 MG tablet Take 1 tablet (200 mg total) by mouth 3 (three) times daily as needed for pain. 10 tablet 0   topiramate (TOPAMAX) 25 MG tablet TAKE 1 TABLET (25 MG TOTAL) BY MOUTH 2 (TWO) TIMES DAILY. FOR HEADACHE PREVENTION 180 tablet 0   No current facility-administered medications on file prior to visit.    Review of Systems:  As per HPI- otherwise negative.   Physical Examination: There were no vitals filed  for this visit. There were no vitals filed for this visit. There is no height or weight on file to calculate BMI. Ideal Body Weight:    GEN: no acute distress. HEENT: Atraumatic, Normocephalic.  Ears and Nose: No external deformity. CV: RRR, No M/G/R. No JVD. No thrill. No extra heart sounds. PULM: CTA B, no wheezes, crackles, rhonchi. No retractions. No resp. distress. No accessory muscle use. ABD: S, NT, ND, +BS. No rebound. No HSM. EXTR: No c/c/e PSYCH: Normally interactive. Conversant.    Assessment and Plan: ***  Signed Abbe Amsterdam, MD

## 2022-11-29 ENCOUNTER — Ambulatory Visit (INDEPENDENT_AMBULATORY_CARE_PROVIDER_SITE_OTHER): Payer: BC Managed Care – PPO | Admitting: Family Medicine

## 2022-11-29 ENCOUNTER — Encounter: Payer: Self-pay | Admitting: Family Medicine

## 2022-11-29 VITALS — BP 138/82 | HR 96 | Temp 97.8°F | Resp 16 | Ht 67.0 in | Wt 320.8 lb

## 2022-11-29 DIAGNOSIS — G43709 Chronic migraine without aura, not intractable, without status migrainosus: Secondary | ICD-10-CM

## 2022-11-29 MED ORDER — NURTEC 75 MG PO TBDP
ORAL_TABLET | ORAL | 1 refills | Status: DC
Start: 2022-11-29 — End: 2022-12-29

## 2022-11-29 MED ORDER — CYCLOBENZAPRINE HCL 10 MG PO TABS
10.0000 mg | ORAL_TABLET | Freq: Every day | ORAL | 0 refills | Status: DC
Start: 2022-11-29 — End: 2022-12-29

## 2022-11-29 NOTE — Patient Instructions (Signed)
It was good to see you today, I am sorry you are having such a hard time  Lets try having you take a Flexeril at bedtime to help with sleep and muscle spasm.  Assuming we can get it paid for either through insurance or the co-pay card, lets have you also try taking Nurtec every other day for a month  Before you go to the pharmacy, check out the Nurtec website and apply for the $0 co-pay card  Please do follow-up with your eye doctor soon as soon as possible, have them check for papilledema which can be a sign of idiopathic intracranial hypertension  Please let me know how this all works for you Would also recommend that you try a massage

## 2022-12-15 DIAGNOSIS — L659 Nonscarring hair loss, unspecified: Secondary | ICD-10-CM | POA: Diagnosis not present

## 2022-12-15 DIAGNOSIS — E2749 Other adrenocortical insufficiency: Secondary | ICD-10-CM | POA: Diagnosis not present

## 2022-12-15 DIAGNOSIS — E242 Drug-induced Cushing's syndrome: Secondary | ICD-10-CM | POA: Diagnosis not present

## 2022-12-15 DIAGNOSIS — E041 Nontoxic single thyroid nodule: Secondary | ICD-10-CM | POA: Diagnosis not present

## 2022-12-19 DIAGNOSIS — H15009 Unspecified scleritis, unspecified eye: Secondary | ICD-10-CM | POA: Diagnosis not present

## 2022-12-19 DIAGNOSIS — M0579 Rheumatoid arthritis with rheumatoid factor of multiple sites without organ or systems involvement: Secondary | ICD-10-CM | POA: Diagnosis not present

## 2022-12-19 DIAGNOSIS — R809 Proteinuria, unspecified: Secondary | ICD-10-CM | POA: Diagnosis not present

## 2022-12-19 DIAGNOSIS — M3130 Wegener's granulomatosis without renal involvement: Secondary | ICD-10-CM | POA: Diagnosis not present

## 2022-12-19 DIAGNOSIS — R5383 Other fatigue: Secondary | ICD-10-CM | POA: Diagnosis not present

## 2022-12-28 ENCOUNTER — Encounter: Payer: Self-pay | Admitting: Family Medicine

## 2022-12-28 DIAGNOSIS — G43709 Chronic migraine without aura, not intractable, without status migrainosus: Secondary | ICD-10-CM

## 2022-12-29 ENCOUNTER — Telehealth: Payer: Self-pay

## 2022-12-29 MED ORDER — NURTEC 75 MG PO TBDP: ORAL_TABLET | ORAL | 3 refills | Status: DC

## 2022-12-29 MED ORDER — CYCLOBENZAPRINE HCL 10 MG PO TABS: 10.0000 mg | ORAL_TABLET | Freq: Every day | ORAL | 1 refills | Status: DC

## 2022-12-29 NOTE — Telephone Encounter (Signed)
Additional information requested to complete PA. Form started and placed in folder.

## 2022-12-29 NOTE — Telephone Encounter (Signed)
PA initiated:   Victory Dakin (Key: [facility])

## 2023-01-01 ENCOUNTER — Other Ambulatory Visit: Payer: Self-pay | Admitting: Family Medicine

## 2023-01-01 DIAGNOSIS — G5691 Unspecified mononeuropathy of right upper limb: Secondary | ICD-10-CM

## 2023-01-01 DIAGNOSIS — M5090 Cervical disc disorder, unspecified, unspecified cervical region: Secondary | ICD-10-CM

## 2023-01-10 ENCOUNTER — Telehealth: Payer: Self-pay | Admitting: Family Medicine

## 2023-01-10 NOTE — Telephone Encounter (Signed)
Diane Wells from [place] called to notify pcp that the prior auth for Nurtec was denied. Please advise.

## 2023-01-11 NOTE — Telephone Encounter (Signed)
Please see below:   Reasons not mentioned.

## 2023-01-13 ENCOUNTER — Encounter: Payer: Self-pay | Admitting: Family Medicine

## 2023-01-13 DIAGNOSIS — G43709 Chronic migraine without aura, not intractable, without status migrainosus: Secondary | ICD-10-CM

## 2023-01-13 DIAGNOSIS — I10 Essential (primary) hypertension: Secondary | ICD-10-CM

## 2023-01-18 DIAGNOSIS — H15013 Anterior scleritis, bilateral: Secondary | ICD-10-CM | POA: Diagnosis not present

## 2023-01-18 DIAGNOSIS — Z79899 Other long term (current) drug therapy: Secondary | ICD-10-CM | POA: Diagnosis not present

## 2023-01-18 DIAGNOSIS — Z83518 Family history of other specified eye disorder: Secondary | ICD-10-CM | POA: Diagnosis not present

## 2023-01-25 DIAGNOSIS — L638 Other alopecia areata: Secondary | ICD-10-CM | POA: Diagnosis not present

## 2023-01-25 DIAGNOSIS — L02821 Furuncle of head [any part, except face]: Secondary | ICD-10-CM | POA: Diagnosis not present

## 2023-02-10 MED ORDER — UBRELVY 100 MG PO TABS
ORAL_TABLET | ORAL | 3 refills | Status: DC
Start: 2023-02-10 — End: 2023-03-15

## 2023-02-10 MED ORDER — CYCLOBENZAPRINE HCL 5 MG PO TABS
ORAL_TABLET | ORAL | 3 refills | Status: DC
Start: 2023-02-10 — End: 2023-06-26

## 2023-02-10 NOTE — Addendum Note (Signed)
Addended by: Abbe Amsterdam C on: 02/10/2023 06:11 AM   Modules accepted: Orders

## 2023-02-12 MED ORDER — LOSARTAN POTASSIUM 50 MG PO TABS: 75.0000 mg | ORAL_TABLET | Freq: Every day | ORAL | 3 refills | Status: DC

## 2023-02-12 NOTE — Addendum Note (Signed)
Addended by: Abbe Amsterdam C on: 02/12/2023 01:26 PM   Modules accepted: Orders

## 2023-02-14 ENCOUNTER — Telehealth: Payer: Self-pay

## 2023-02-14 NOTE — Telephone Encounter (Signed)
PA initiated via Covermymeds; KEY: [id number]. Awaiting determination.

## 2023-02-15 NOTE — Telephone Encounter (Signed)
Bernita Raisin can be approved when it is not taken with another medication used to treat acute migraines (such as sumatriptan, Zavzpret, Nurtec, etc) at the same time.   In this case, the member is taking this medication at the same time as another medication used to treat acute migraines.

## 2023-02-15 NOTE — Telephone Encounter (Signed)
PA denied. Awaiting denial information.  °

## 2023-02-19 ENCOUNTER — Encounter: Payer: Self-pay | Admitting: Family Medicine

## 2023-02-19 NOTE — Addendum Note (Signed)
Addended by: Abbe Amsterdam C on: 02/19/2023 09:07 PM   Modules accepted: Orders

## 2023-02-20 NOTE — Telephone Encounter (Signed)
Will attempt a new PA as I no longer have the appeal information.   PA initiated via Covermymeds; KEY: [id number]. Awaiting determination.

## 2023-02-20 NOTE — Telephone Encounter (Signed)
PA cancelled by plan for duplicate request/denial on file. Please let me know if they send another denial letter w/ appeal information. Thank you.

## 2023-02-21 NOTE — Telephone Encounter (Signed)
Called provider services at [facility] at [phone]- spoke w/ Theodoro Doing B, she informed that they did receive the additional information via Covermymeds- it is w/ the clinical review pharmacist with an expected determination date by 02/26/23. They will fax the decision to our office once decision has been made.

## 2023-02-28 ENCOUNTER — Other Ambulatory Visit: Payer: Self-pay | Admitting: Family Medicine

## 2023-02-28 DIAGNOSIS — G43009 Migraine without aura, not intractable, without status migrainosus: Secondary | ICD-10-CM

## 2023-03-12 DIAGNOSIS — I10 Essential (primary) hypertension: Secondary | ICD-10-CM | POA: Diagnosis not present

## 2023-03-12 DIAGNOSIS — E041 Nontoxic single thyroid nodule: Secondary | ICD-10-CM | POA: Diagnosis not present

## 2023-03-12 DIAGNOSIS — E242 Drug-induced Cushing's syndrome: Secondary | ICD-10-CM | POA: Diagnosis not present

## 2023-03-12 DIAGNOSIS — E2749 Other adrenocortical insufficiency: Secondary | ICD-10-CM | POA: Diagnosis not present

## 2023-03-13 ENCOUNTER — Encounter: Payer: Self-pay | Admitting: Family Medicine

## 2023-03-13 ENCOUNTER — Encounter (HOSPITAL_BASED_OUTPATIENT_CLINIC_OR_DEPARTMENT_OTHER): Payer: Self-pay | Admitting: Urology

## 2023-03-13 DIAGNOSIS — R55 Syncope and collapse: Secondary | ICD-10-CM | POA: Diagnosis not present

## 2023-03-13 DIAGNOSIS — Z79899 Other long term (current) drug therapy: Secondary | ICD-10-CM | POA: Diagnosis not present

## 2023-03-13 DIAGNOSIS — I10 Essential (primary) hypertension: Secondary | ICD-10-CM | POA: Diagnosis not present

## 2023-03-13 DIAGNOSIS — G43909 Migraine, unspecified, not intractable, without status migrainosus: Secondary | ICD-10-CM | POA: Diagnosis not present

## 2023-03-13 DIAGNOSIS — R519 Headache, unspecified: Secondary | ICD-10-CM | POA: Diagnosis not present

## 2023-03-13 NOTE — ED Triage Notes (Signed)
Pt states bp high at dr yesterday 182/120 Also states migraine that started  yesterday  PCP told to come to ER  Started on betablocker yesterday    750 robaxin, gabpentin and 800 mg ibuprofen with no relief  On xelganz (new med)   H/o RA and other autoimmune diseases

## 2023-03-13 NOTE — Telephone Encounter (Signed)
Called pt- I directed her to the ER and she is in agreement

## 2023-03-14 ENCOUNTER — Emergency Department (HOSPITAL_BASED_OUTPATIENT_CLINIC_OR_DEPARTMENT_OTHER)
Admission: EM | Admit: 2023-03-14 | Discharge: 2023-03-14 | Disposition: A | Discharge status: Home / Self Care | Payer: BC Managed Care – PPO | Attending: Emergency Medicine | Admitting: Emergency Medicine

## 2023-03-14 ENCOUNTER — Telehealth: Payer: Self-pay | Admitting: Neurology

## 2023-03-14 ENCOUNTER — Emergency Department (HOSPITAL_BASED_OUTPATIENT_CLINIC_OR_DEPARTMENT_OTHER): Payer: BC Managed Care – PPO

## 2023-03-14 DIAGNOSIS — G43909 Migraine, unspecified, not intractable, without status migrainosus: Secondary | ICD-10-CM | POA: Diagnosis not present

## 2023-03-14 DIAGNOSIS — R55 Syncope and collapse: Secondary | ICD-10-CM | POA: Diagnosis not present

## 2023-03-14 DIAGNOSIS — I10 Essential (primary) hypertension: Secondary | ICD-10-CM

## 2023-03-14 DIAGNOSIS — R519 Headache, unspecified: Secondary | ICD-10-CM

## 2023-03-14 LAB — PREGNANCY, URINE: Preg Test, Ur: NEGATIVE

## 2023-03-14 MED ORDER — MAGNESIUM SULFATE 2 GM/50ML IV SOLN
2.0000 g | Freq: Once | INTRAVENOUS | Status: AC
  Administered 2023-03-14: 2 g via INTRAVENOUS
  Filled 2023-03-14: qty 50

## 2023-03-14 MED ORDER — MELOXICAM 15 MG PO TABS: 15.0000 mg | ORAL_TABLET | Freq: Every day | ORAL | 0 refills | Status: DC

## 2023-03-14 MED ORDER — DIPHENHYDRAMINE HCL 50 MG/ML IJ SOLN
12.5000 mg | Freq: Once | INTRAMUSCULAR | Status: AC
  Administered 2023-03-14: 12.5 mg via INTRAVENOUS
  Filled 2023-03-14: qty 1

## 2023-03-14 MED ORDER — METOCLOPRAMIDE HCL 5 MG/ML IJ SOLN
10.0000 mg | Freq: Once | INTRAMUSCULAR | Status: AC
  Administered 2023-03-14: 10 mg via INTRAVENOUS
  Filled 2023-03-14: qty 2

## 2023-03-14 MED ORDER — KETOROLAC TROMETHAMINE 30 MG/ML IJ SOLN
30.0000 mg | Freq: Once | INTRAMUSCULAR | Status: AC
  Administered 2023-03-14: 30 mg via INTRAVENOUS
  Filled 2023-03-14: qty 1

## 2023-03-14 NOTE — Progress Notes (Unsigned)
No chief complaint on file.   HISTORY OF PRESENT ILLNESS:  03/14/23 ALL:  Diane Wells is a 42 y.o. female here today for follow up for recurrent headaches. She was seen in consult with Dr Frances Furbish 01/2022. She was advised to take topiramate 50mg  at bedtime to help with compliance and recommended she consider ER dosing if needed. HST 03/2022 showed AHI borderline at 5.3/hr. No CPAP advised. She reported GI side effects with taking topiramate 50mg  daily and had reduced dose to 25mg . She was scheduled for follow up with Ihor Austin 03/2022 but cancelled. She presented to UC yesterday with headache. She reported being started on metoprolol 12/17 for HTN. BP had been 182/120 and 200/126 in endocrinology office. CT normal. BP 154/91 in ER. She was given meloxicam.   Since,    HISTORY (copied from Dr Teofilo Pod previous note)  Dear Dr. Patsy Lager,   I saw your patient, Diane Wells, upon your kind request in my neurologic clinic today for initial consultation of her recurrent headaches.  The patient is unaccompanied today.  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  She has had associated nausea.  She has not had any strokelike symptoms thankfully.  She tries to hydrate well, avoids caffeine, limits herself to 1 serving per day on average.  She has tried Imitrex on a few occasions but did not find it helpful, Fioricet helps with severe tension headaches but not so much with the migrainous headaches she has had.  Imitrex caused her to feel dizzy and  groggy, unable to function.   Of note, she does not sleep very well.  Sleep is interrupted and not restful.  Her Epworth sleepiness score is 13 out of 24, fatigue severity score is 57 out of 63 today.  She has never had a sleep study but would be willing to explore this.  She has a family history of sleep apnea affecting both parents.  She has a history of snoring.   I reviewed your office note from 11/17/2021.  She was recently started on Topamax for headaches.  She reported improvement with her Topamax.  She is currently taking 25 mg twice daily.  She has had some side effects including diarrhea, and nausea and cognitive slowing.  Her cognitive side effects improved and nausea improved but she still has intermittent diarrhea.   She had a brain MRI with and without contrast on 12/01/2021 and I reviewed the results: Impression:1. Narrowing of the distal transverse sinuses near the transverse-sigmoid junction, with mild optic nerve tortuosity. These findings are nonspecific but can be seen in the setting of idiopathic intracranial hypertension, a cause of headaches. Correlate with symptoms and consider lumbar puncture with opening pressure if clinically indicated. 2. No additional acute intracranial process.   She had a cervical spine MRI without contrast on 12/01/2021 and I reviewed the results: Impression: 1. C6-C7 ACDF, without spinal canal stenosis or neural foraminal narrowing. 2. Mild degenerative changes in the cervical spine without spinal canal stenosis or neural foraminal narrowing.   She had a fluoroscopic guided lumbar  puncture on 12/08/2021 and I reviewed the results: Impression: 1. Technically successful fluoroscopically guided lumbar puncture. 2. Mildly elevated opening CSF pressure of 23 cm water.     Her opening pressure was 23 cm, 8.5 mL of CSF were removed, closing pressure was 17 cm.   She has seen an ophthalmologist.  Her eye examination was reportedly normal.  She has seen Dr.  Shari Prows at [facility] and follows with them on a regular basis, she reports a full eye examination with no evidence of eye nerve swelling and good visual fields.  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  She takes Robaxin twice daily, she also takes gabapentin, 300 in the morning and 600 at bedtime.   She is also followed by oncology, rheumatology and pulmonology.   REVIEW OF SYSTEMS: Out of a complete 14 system review of symptoms, the patient complains only of the following symptoms, and all other reviewed systems are negative.   ALLERGIES: Allergies  Allergen Reactions   Albuterol Shortness Of Breath and Other (See Comments)    Tolerates HFA inhaler, nebulizer treatment caused O2 drop   Other Anaphylaxis    Essential Oil, scent unknown, caused tongue swelling   Truxima [Rituximab] Anaphylaxis   Sulfa Antibiotics Hives   Vancomycin Itching    Scalp itching - felt like it was on fire     HOME MEDICATIONS: Outpatient Medications Prior to Visit  Medication Sig Dispense Refill   albuterol (VENTOLIN HFA) 108 (90 Base) MCG/ACT inhaler INHALE 1-2 PUFFS INTO THE LUNGS DAILY AS NEEDED FOR SHORTNESS OF BREATH. DAW 18 each 2   butalbital-acetaminophen-caffeine (FIORICET) 50-325-40 MG tablet TAKE 1 TABLET BY MOUTH EVERY 6 HOURS AS NEEDED FOR HEADACHE 60 tablet 0   cyclobenzaprine (FLEXERIL) 10 MG tablet Take 1 tablet (10 mg total) by mouth at bedtime. Use as needed for muscle spasm and headache 90 tablet 1   cyclobenzaprine (FLEXERIL) 5 MG tablet Take 5- 10 mg at bedtime as needed for headache and muscle spasm 60 tablet 3   EPINEPHrine 0.3 mg/0.3 mL IJ SOAJ injection Inject 0.3 mg into the muscle once as needed for anaphylaxis.     fexofenadine (ALLEGRA) 180 MG tablet Take 180 mg by mouth every morning.     fluticasone (FLONASE) 50 MCG/ACT nasal spray Place 2 sprays into both nostrils in the morning and at bedtime. 16 g 11   furosemide (LASIX) 20 MG tablet TAKE 1 (ONE)  TABLET DAILY AS NEEDED (Patient taking differently: Take 20-40 mg by mouth daily as needed (swelling).) 90 tablet 0   gabapentin (NEURONTIN) 300 MG capsule Take 1-2 capsules (300-600 mg total) by mouth 3 (three) times daily as needed. 270 capsule 1   hydrocortisone (CORTEF) 10 MG tablet Take 5 mg by mouth every morning.     hydroxychloroquine (PLAQUENIL) 200 MG tablet Take 1 tablet (200 mg total) by mouth 2 (two) times daily. Hold until ok with your rheumatologist     ketorolac (ACULAR) 0.5 % ophthalmic solution 1 drop See admin instructions. Apply one drop into left eye daily at bedtime, may also use up to three more times daily in affected eye(s) as needed for scleritis     losartan (COZAAR) 50 MG tablet Take 1.5 tablets (75 mg total) by mouth daily. 135 tablet 3   meloxicam (MOBIC) 15 MG tablet Take 1 tablet (15 mg total) by mouth daily. 7 tablet 0   methocarbamol (ROBAXIN) 750 MG tablet TAKE 1 TABLET (750 MG TOTAL)  BY MOUTH EVERY 8 (EIGHT) HOURS AS NEEDED FOR MUSCLE SPASMS. 90 tablet 1   mometasone-formoterol (DULERA) 200-5 MCG/ACT AERO Inhale 2 puffs into the lungs in the morning and at bedtime. 1 each 5   mycophenolate (CELLCEPT) 500 MG tablet Take 3 tablets (1,500 mg total) by mouth 2 (two) times daily. Hold this meds, resume when ok with your rheumatologist     ondansetron (ZOFRAN) 8 MG tablet Take 1 tablet (8 mg total) by mouth every 8 (eight) hours as needed for nausea or vomiting. 30 tablet 2   phenazopyridine (PYRIDIUM) 200 MG tablet Take 1 tablet (200 mg total) by mouth 3 (three) times daily as needed for pain. 10 tablet 0   topiramate (TOPAMAX) 25 MG tablet TAKE 1 TABLET (25 MG TOTAL) BY MOUTH 2 (TWO) TIMES DAILY. FOR HEADACHE PREVENTION 180 tablet 0   Ubrogepant (UBRELVY) 100 MG TABS Take 100 mg once daily as needed for headache, max 200 mg in 24 hours.  May repeat 100 mg after 2 hours if needed 30 tablet 3   No facility-administered medications prior to visit.     PAST MEDICAL  HISTORY: Past Medical History:  Diagnosis Date   Adrenal insufficiency (HCC)    Allergic rhinitis    Asthma    Astigmatism    Chronic kidney disease    Cushing's disease (HCC)    idiopathic   Granulomatosis with polyangiitis (HCC)    Hypertension    Neutropenia (HCC)    hospitalized in early 2023 for 9 days   Recurrent upper respiratory infection (URI)      PAST SURGICAL HISTORY: Past Surgical History:  Procedure Laterality Date   ADENOIDECTOMY     ANTERIOR CERVICAL DECOMP/DISCECTOMY FUSION       FAMILY HISTORY: Family History  Problem Relation Age of Onset   Diabetes Mother        Type 1 Diabetes, juvenile onset   Hypertension Mother    Pulmonary Hypertension Mother    Hyperthyroidism Mother    Glaucoma Mother    Neuropathy Mother    Sjogren's syndrome Mother    Carpal tunnel syndrome Mother    Hypertension Father    Gallbladder disease Father    Breast cancer Sister 37       metastic IBC   Diabetes Maternal Uncle    Heart attack Maternal Uncle    Stroke Maternal Uncle    Hypertension Maternal Grandmother    Glaucoma Maternal Grandmother    Coronary artery disease Maternal Grandmother    Heart attack Maternal Grandmother    Angina Maternal Grandmother    Macular degeneration Maternal Grandmother    COPD Maternal Grandmother    Congestive Heart Failure Maternal Grandmother    Glaucoma Maternal Grandfather    Leukemia Paternal Grandmother    Hypertension Paternal Grandmother    Colon cancer Paternal Grandfather    Allergic rhinitis Neg Hx    Angioedema Neg Hx    Asthma Neg Hx    Eczema Neg Hx    Pseudotumor cerebri Neg Hx    Migraines Neg Hx      SOCIAL HISTORY: Social History   Socioeconomic History   Marital status: Single    Spouse name: Not on file   Number of children: 0   Years of education: Not on file   Highest education level: Some college, no degree  Occupational History   Not on file  Tobacco Use   Smoking status: Never    Smokeless tobacco: Never  Vaping Use  Vaping status: Never Used  Substance and Sexual Activity   Alcohol use: Not Currently    Comment: was rare   Drug use: No   Sexual activity: Not Currently    Birth control/protection: None  Other Topics Concern   Not on file  Social History Narrative   Lives with her parents to help take care of them    Right handed   Caffeine: small cup of coffee once every other month and then caffeine in Fioricet    Social Drivers of Health   Financial Resource Strain: Medium Risk (06/21/2022)   Overall Financial Resource Strain (CARDIA)    Difficulty of Paying Living Expenses: Somewhat hard  Food Insecurity: No Food Insecurity (06/21/2022)   Hunger Vital Sign    Worried About Running Out of Food in the Last Year: Never true    Ran Out of Food in the Last Year: Never true  Transportation Needs: No Transportation Needs (06/21/2022)   PRAPARE - Administrator, Civil Service (Medical): No    Lack of Transportation (Non-Medical): No  Physical Activity: Unknown (06/21/2022)   Exercise Vital Sign    Days of Exercise per Week: 0 days    Minutes of Exercise per Session: Not on file  Stress: No Stress Concern Present (06/21/2022)   [employer] of Occupational Health - Occupational Stress Questionnaire    Feeling of Stress : Only a little  Social Connections: Socially Isolated (06/21/2022)   Social Connection and Isolation Panel [NHANES]    Frequency of Communication with Friends and Family: More than three times a week    Frequency of Social Gatherings with Friends and Family: More than three times a week    Attends Religious Services: Never    Database administrator or Organizations: No    Attends Engineer, structural: Not on file    Marital Status: Never married  Intimate Partner Violence: Not on file     PHYSICAL EXAM  There were no vitals filed for this visit. There is no height or weight on file to calculate  BMI.  Generalized: Well developed, in no acute distress  Cardiology: normal rate and rhythm, no murmur auscultated  Respiratory: clear to auscultation bilaterally    Neurological examination  Mentation: Alert oriented to time, place, history taking. Follows all commands speech and language fluent Cranial nerve II-XII: Pupils were equal round reactive to light. Extraocular movements were full, visual field were full on confrontational test. Facial sensation and strength were normal. Uvula tongue midline. Head turning and shoulder shrug  were normal and symmetric. Motor: The motor testing reveals 5 over 5 strength of all 4 extremities. Good symmetric motor tone is noted throughout.  Sensory: Sensory testing is intact to soft touch on all 4 extremities. No evidence of extinction is noted.  Coordination: Cerebellar testing reveals good finger-nose-finger and heel-to-shin bilaterally.  Gait and station: Gait is normal. Tandem gait is normal. Romberg is negative. No drift is seen.  Reflexes: Deep tendon reflexes are symmetric and normal bilaterally.    DIAGNOSTIC DATA (LABS, IMAGING, TESTING) - I reviewed patient records, labs, notes, testing and imaging myself where available.  Lab Results  Component Value Date   WBC 22.5 (H) 06/15/2021   HGB 12.0 06/15/2021   HCT 37.5 06/15/2021   MCV 84.3 06/15/2021   PLT 101 (L) 06/15/2021      Component Value Date/Time   NA 136 06/15/2021 1504   K 4.4 06/15/2021 1504   CL 102 06/15/2021  1504   CO2 25 06/15/2021 1504   GLUCOSE 89 06/15/2021 1504   BUN 14 06/15/2021 1504   CREATININE 0.99 06/15/2021 1504   CALCIUM 8.2 (L) 06/15/2021 1504   PROT 6.7 06/15/2021 1504   ALBUMIN 3.3 (L) 06/15/2021 1504   AST 28 06/15/2021 1504   ALT 26 06/15/2021 1504   ALKPHOS 69 06/15/2021 1504   BILITOT 0.2 (L) 06/15/2021 1504   GFRNONAA >60 06/15/2021 1504   GFRAA >60 05/23/2019 2010   Lab Results  Component Value Date   CHOL 177 05/16/2018   HDL 52.00  05/16/2018   LDLCALC 108 (H) 05/16/2018   TRIG 83.0 05/16/2018   CHOLHDL 3 05/16/2018   Lab Results  Component Value Date   HGBA1C 4.4 (L) 05/19/2019   Lab Results  Component Value Date   VITAMINB12 371 06/04/2021   Lab Results  Component Value Date   TSH 1.60 10/20/2020        No data to display               No data to display           ASSESSMENT AND PLAN  42 y.o. year old female  has a past medical history of Adrenal insufficiency (HCC), Allergic rhinitis, Asthma, Astigmatism, Chronic kidney disease, Cushing's disease (HCC), Granulomatosis with polyangiitis (HCC), Hypertension, Neutropenia (HCC), and Recurrent upper respiratory infection (URI). here with    No diagnosis found.  [facility] ***.  Healthy lifestyle habits encouraged. *** will follow up with PCP as directed. *** will return to see me in ***, sooner if needed. *** verbalizes understanding and agreement with this plan.   No orders of the defined types were placed in this encounter.    No orders of the defined types were placed in this encounter.    Shawnie Dapper, MSN, FNP-C 03/14/2023, 4:28 PM  [facility] [address], [address] [place], [place] [address] [phone]

## 2023-03-14 NOTE — Patient Instructions (Incomplete)

## 2023-03-14 NOTE — ED Notes (Signed)
Patient transported to CT 

## 2023-03-14 NOTE — ED Provider Notes (Signed)
[facility] Provider Note   CSN: [id number] Arrival date & time: 03/13/23  1928     History  Chief Complaint  Patient presents with   Multiple complaints     Diane Wells is a 42 y.o. female.  The history is provided by the patient.  Headache Pain location:  R parietal Quality:  Dull Radiates to:  Does not radiate Onset quality:  Gradual Duration:  5 days Timing:  Constant Progression:  Waxing and waning Context: not straining   Relieved by:  Nothing Worsened by:  Nothing Associated symptoms: no blurred vision, no facial pain, no fever, no neck pain, no neck stiffness, no numbness, no URI, no visual change, no vomiting and no weakness   Risk factors: no anger and no family hx of SAH   Patient has known HTN and was started on a new medication yesterday (metoprolol) but BP is not yet improved and patient is having headaches that alternate between then left and right side and have been gradual in onset for 5 days.  No f/c/r.  No focal neurologic symptoms.       Home Medications Prior to Admission medications   Medication Sig Start Date End Date Taking? Authorizing Provider  meloxicam (MOBIC) 15 MG tablet Take 1 tablet (15 mg total) by mouth daily. 03/14/23  Yes Iasiah Ozment, MD  albuterol (VENTOLIN HFA) 108 (90 Base) MCG/ACT inhaler INHALE 1-2 PUFFS INTO THE LUNGS DAILY AS NEEDED FOR SHORTNESS OF BREATH. DAW 09/08/21   Charlott Holler, MD  butalbital-acetaminophen-caffeine (FIORICET) [phone] MG tablet TAKE 1 TABLET BY MOUTH EVERY 6 HOURS AS NEEDED FOR HEADACHE 03/01/23   Copland, Gwenlyn Found, MD  cyclobenzaprine (FLEXERIL) 10 MG tablet Take 1 tablet (10 mg total) by mouth at bedtime. Use as needed for muscle spasm and headache 12/29/22   Copland, Gwenlyn Found, MD  cyclobenzaprine (FLEXERIL) 5 MG tablet Take 5- 10 mg at bedtime as needed for headache and muscle spasm 02/10/23   Copland, Gwenlyn Found, MD  EPINEPHrine 0.3 mg/0.3 mL IJ  SOAJ injection Inject 0.3 mg into the muscle once as needed for anaphylaxis.    [provider]  fexofenadine (ALLEGRA) 180 MG tablet Take 180 mg by mouth every morning.    [provider]  fluticasone (FLONASE) 50 MCG/ACT nasal spray Place 2 sprays into both nostrils in the morning and at bedtime. 01/01/20 01/30/22  Charlott Holler, MD  furosemide (LASIX) 20 MG tablet TAKE 1 (ONE) TABLET DAILY AS NEEDED Patient taking differently: Take 20-40 mg by mouth daily as needed (swelling). 04/13/21   Copland, Gwenlyn Found, MD  gabapentin (NEURONTIN) 300 MG capsule Take 1-2 capsules (300-600 mg total) by mouth 3 (three) times daily as needed. 01/01/23   Copland, Gwenlyn Found, MD  hydrocortisone (CORTEF) 10 MG tablet Take 5 mg by mouth every morning. 10/02/20   [provider]  hydroxychloroquine (PLAQUENIL) 200 MG tablet Take 1 tablet (200 mg total) by mouth 2 (two) times daily. Hold until ok with your rheumatologist 06/10/21   Albertine Grates, MD  ketorolac (ACULAR) 0.5 % ophthalmic solution 1 drop See admin instructions. Apply one drop into left eye daily at bedtime, may also use up to three more times daily in affected eye(s) as needed for scleritis 11/11/20   [provider]  losartan (COZAAR) 50 MG tablet Take 1.5 tablets (75 mg total) by mouth daily. 02/12/23   Copland, Gwenlyn Found, MD  methocarbamol (ROBAXIN) 750 MG tablet TAKE  1 TABLET (750 MG TOTAL) BY MOUTH EVERY 8 (EIGHT) HOURS AS NEEDED FOR MUSCLE SPASMS. 11/21/22   Copland, Gwenlyn Found, MD  mometasone-formoterol (DULERA) 200-5 MCG/ACT AERO Inhale 2 puffs into the lungs in the morning and at bedtime. 05/29/22   Charlott Holler, MD  mycophenolate (CELLCEPT) 500 MG tablet Take 3 tablets (1,500 mg total) by mouth 2 (two) times daily. Hold this meds, resume when ok with your rheumatologist 06/10/21   Albertine Grates, MD  ondansetron (ZOFRAN) 8 MG tablet Take 1 tablet (8 mg total) by mouth every 8 (eight) hours as needed for nausea or vomiting. 10/20/20    Copland, Gwenlyn Found, MD  phenazopyridine (PYRIDIUM) 200 MG tablet Take 1 tablet (200 mg total) by mouth 3 (three) times daily as needed for pain. 06/22/22   Clayborne Dana, NP  topiramate (TOPAMAX) 25 MG tablet TAKE 1 TABLET (25 MG TOTAL) BY MOUTH 2 (TWO) TIMES DAILY. FOR HEADACHE PREVENTION 05/22/22   Copland, Gwenlyn Found, MD  Ubrogepant (UBRELVY) 100 MG TABS Take 100 mg once daily as needed for headache, max 200 mg in 24 hours.  May repeat 100 mg after 2 hours if needed 02/10/23   Copland, Gwenlyn Found, MD      Allergies    Albuterol, Other, Truxima [rituximab], Sulfa antibiotics, and Vancomycin    Review of Systems   Review of Systems  Constitutional:  Negative for fever.  HENT:  Negative for facial swelling.   Eyes:  Negative for blurred vision.  Respiratory:  Negative for shortness of breath, wheezing and stridor.   Cardiovascular:  Negative for chest pain.  Gastrointestinal:  Negative for vomiting.  Musculoskeletal:  Negative for neck pain and neck stiffness.  Neurological:  Positive for headaches. Negative for weakness and numbness.  All other systems reviewed and are negative.   Physical Exam Updated Vital Signs BP (!) 154/91   Pulse 89   Temp 97.9 F (36.6 C) (Oral)   Resp 20   Ht 5\' 7"  (1.702 m)   Wt (!) 145.5 kg   SpO2 93%   BMI 50.24 kg/m  Physical Exam Vitals and nursing note reviewed.  Constitutional:      General: She is not in acute distress.    Appearance: She is well-developed.  HENT:     Head: Normocephalic and atraumatic.     Nose: Nose normal.     Mouth/Throat:     Mouth: Mucous membranes are moist.     Pharynx: Oropharynx is clear.  Eyes:     Extraocular Movements: Extraocular movements intact.     Pupils: Pupils are equal, round, and reactive to light.     Comments: Disk sharp no proptosis intact cognition   Cardiovascular:     Rate and Rhythm: Normal rate and regular rhythm.     Pulses: Normal pulses.     Heart sounds: Normal heart sounds.   Pulmonary:     Effort: Pulmonary effort is normal. No respiratory distress.     Breath sounds: Normal breath sounds.  Abdominal:     General: Bowel sounds are normal. There is no distension.     Palpations: Abdomen is soft.     Tenderness: There is no abdominal tenderness. There is no guarding or rebound.  Musculoskeletal:        General: Normal range of motion.     Cervical back: Normal range of motion and neck supple.  Skin:    General: Skin is dry.     Capillary Refill: Capillary refill  takes less than 2 seconds.     Findings: No erythema or rash.  Neurological:     General: No focal deficit present.     Deep Tendon Reflexes: Reflexes normal.  Psychiatric:        Mood and Affect: Mood normal.        Behavior: Behavior normal.     ED Results / Procedures / Treatments   Labs (all labs ordered are listed, but only abnormal results are displayed) Results for orders placed or performed during the hospital encounter of 03/14/23  Pregnancy, urine   Collection Time: 03/14/23  1:19 AM  Result Value Ref Range   Preg Test, Ur NEGATIVE NEGATIVE   CT Head Wo Contrast Result Date: 03/14/2023 CLINICAL DATA:  Syncope/presyncope and migraine with onset yesterday. History of hypertension and granulomatosis with polyangiitis. EXAM: CT HEAD WITHOUT CONTRAST TECHNIQUE: Contiguous axial images were obtained from the base of the skull through the vertex without intravenous contrast. RADIATION DOSE REDUCTION: This exam was performed according to the departmental dose-optimization program which includes automated exposure control, adjustment of the mA and/or kV according to patient size and/or use of iterative reconstruction technique. COMPARISON:  MRI head 12/01/2021 FINDINGS: Brain: No intracranial hemorrhage, mass effect, or evidence of acute infarct. No hydrocephalus. No extra-axial fluid collection. Vascular: No hyperdense vessel or unexpected calcification. Skull: No fracture or focal lesion.  Sinuses/Orbits: No acute finding. Other: None. IMPRESSION: No acute intracranial abnormality. Electronically Signed   By: Minerva Fester M.D.   On: 03/14/2023 01:09     Radiology CT Head Wo Contrast Result Date: 03/14/2023 CLINICAL DATA:  Syncope/presyncope and migraine with onset yesterday. History of hypertension and granulomatosis with polyangiitis. EXAM: CT HEAD WITHOUT CONTRAST TECHNIQUE: Contiguous axial images were obtained from the base of the skull through the vertex without intravenous contrast. RADIATION DOSE REDUCTION: This exam was performed according to the departmental dose-optimization program which includes automated exposure control, adjustment of the mA and/or kV according to patient size and/or use of iterative reconstruction technique. COMPARISON:  MRI head 12/01/2021 FINDINGS: Brain: No intracranial hemorrhage, mass effect, or evidence of acute infarct. No hydrocephalus. No extra-axial fluid collection. Vascular: No hyperdense vessel or unexpected calcification. Skull: No fracture or focal lesion. Sinuses/Orbits: No acute finding. Other: None. IMPRESSION: No acute intracranial abnormality. Electronically Signed   By: Minerva Fester M.D.   On: 03/14/2023 01:09    Procedures Procedures    Medications Ordered in ED Medications  magnesium sulfate IVPB 2 g 50 mL (0 g Intravenous Stopped 03/14/23 0200)  metoCLOPramide (REGLAN) injection 10 mg (10 mg Intravenous Given 03/14/23 0129)  diphenhydrAMINE (BENADRYL) injection 12.5 mg (12.5 mg Intravenous Given 03/14/23 0129)  ketorolac (TORADOL) 30 MG/ML injection 30 mg (30 mg Intravenous Given 03/14/23 0157)    ED Course/ Medical Decision Making/ A&P                                 Medical Decision Making Patient with elevated BP and headaches told to come in by PMD  Amount and/or Complexity of Data Reviewed External Data Reviewed: notes.    Details: Previous notes reviewed  Labs: ordered.    Details: Pregnancy is  negative  Radiology: ordered and independent interpretation performed.    Details: Normal head CT  Risk Prescription drug management. Risk Details: Very well appearing.  I believe the headache and BP are independent of each other but BP improves with management of the  headache.  Based on JNC 7 there is no indication for emergent lowering of this BP in the ED.  Referring back to the PMD for treatment of BP, medication has not had time to work yet.  Refer to headache specialist for headaches.  Stable for discharge.  Strict return     Final Clinical Impression(s) / ED Diagnoses Final diagnoses:  Headache disorder  Primary hypertension   Return for intractable cough, coughing up blood, fevers > 100.4 unrelieved by medication, shortness of breath, intractable vomiting, chest pain, shortness of breath, weakness, numbness, changes in speech, facial asymmetry, abdominal pain, passing out, Inability to tolerate liquids or food, cough, altered mental status or any concerns. No signs of systemic illness or infection. The patient is nontoxic-appearing on exam and vital signs are within normal limits.  I have reviewed the triage vital signs and the nursing notes. Pertinent labs & imaging results that were available during my care of the patient were reviewed by me and considered in my medical decision making (see chart for details). After history, exam, and medical workup I feel the patient has been appropriately medically screened and is safe for discharge home. Pertinent diagnoses were discussed with the patient. Patient was given return precautions.  Rx / DC Orders ED Discharge Orders          Ordered    meloxicam (MOBIC) 15 MG tablet  Daily        03/14/23 0256              Aydin Hink, MD 03/14/23 1914

## 2023-03-14 NOTE — Telephone Encounter (Signed)
Pt has called to schedule a f/u to her headaches.  Pt has been scheduled with a NP since she has only seen Dr Frances Furbish.

## 2023-03-15 ENCOUNTER — Encounter: Payer: Self-pay | Admitting: Family Medicine

## 2023-03-15 ENCOUNTER — Ambulatory Visit: Payer: BC Managed Care – PPO | Admitting: Family Medicine

## 2023-03-15 ENCOUNTER — Ambulatory Visit: Payer: Self-pay | Admitting: Family Medicine

## 2023-03-15 VITALS — BP 179/115 | HR 93 | Ht 67.0 in | Wt 334.0 lb

## 2023-03-15 VITALS — BP 160/120 | HR 82 | Temp 98.1°F | Resp 20 | Ht 67.0 in | Wt 335.6 lb

## 2023-03-15 DIAGNOSIS — R0683 Snoring: Secondary | ICD-10-CM

## 2023-03-15 DIAGNOSIS — G43709 Chronic migraine without aura, not intractable, without status migrainosus: Secondary | ICD-10-CM

## 2023-03-15 DIAGNOSIS — R079 Chest pain, unspecified: Secondary | ICD-10-CM | POA: Diagnosis not present

## 2023-03-15 DIAGNOSIS — M542 Cervicalgia: Secondary | ICD-10-CM | POA: Diagnosis not present

## 2023-03-15 DIAGNOSIS — I10 Essential (primary) hypertension: Secondary | ICD-10-CM

## 2023-03-15 DIAGNOSIS — R519 Headache, unspecified: Secondary | ICD-10-CM

## 2023-03-15 DIAGNOSIS — G8929 Other chronic pain: Secondary | ICD-10-CM

## 2023-03-15 DIAGNOSIS — Z820 Family history of epilepsy and other diseases of the nervous system: Secondary | ICD-10-CM

## 2023-03-15 DIAGNOSIS — G43719 Chronic migraine without aura, intractable, without status migrainosus: Secondary | ICD-10-CM

## 2023-03-15 MED ORDER — UBRELVY 100 MG PO TABS
1.0000 | ORAL_TABLET | Freq: Every day | ORAL | 2 refills | Status: DC
Start: 2023-03-15 — End: 2023-11-14

## 2023-03-15 MED ORDER — KETOROLAC TROMETHAMINE 60 MG/2ML IM SOLN
60.0000 mg | Freq: Once | INTRAMUSCULAR | Status: AC
Start: 2023-03-15 — End: 2023-03-15
  Administered 2023-03-15: 60 mg via INTRAMUSCULAR

## 2023-03-15 MED ORDER — METOPROLOL TARTRATE 50 MG PO TABS: 50.0000 mg | ORAL_TABLET | Freq: Two times a day (BID) | ORAL | 3 refills | Status: DC

## 2023-03-15 MED ORDER — MELOXICAM 15 MG PO TABS: 15.0000 mg | ORAL_TABLET | Freq: Every day | ORAL | 2 refills | Status: DC

## 2023-03-15 NOTE — Progress Notes (Signed)
Established Patient Office Visit  Subjective   Patient ID: Diane Wells, female    DOB: 06-04-1980  Age: 42 y.o. MRN: [id number]  Chief Complaint  Patient presents with   Hypertension    Pt states blood pressures around 170 to 180 to 100 to 115, Pt states she feels likes she is choking. Pt states having a migraine since yesterday.     HPI Discussed the use of AI scribe software for clinical note transcription with the patient, who gave verbal consent to proceed.  History of Present Illness   The patient, with a history of adrenal insufficiency and rheumatoid arthritis, presents with a persistent migraine headache since last Wednesday. The headache is described as starting at the base of the skull and radiating up one side, typically left-sided behind the eye and across the forehead, but recently has been right-sided. The intensity and frequency of these migraines have increased recently, with the current episode being the worst and most persistent.  In addition to the migraines, the patient has been experiencing high blood pressure readings, with values in the range of 170s-180s over 115-120. This was first noted during a recent visit to her endocrinologist, who initiated treatment with Metoprolol 25mg  twice daily. Despite this, the blood pressure readings remained elevated.  The patient also reports a sensation of choking pressure in the neck, which is intermittent and can occur at rest or with exertion. This sensation is described as a tightness that can extend up to the ear. The patient denies any associated chest pain but does report a chronic shortness of breath due to asthma, which may have been slightly worse recently.  The patient's current medication regimen includes Losartan 75mg  for hypertension and Xeljanz for rheumatoid arthritis, which was started six weeks ago. The patient also takes Fioricet for tension headaches, which provides minimal relief, and has tried Imitrex and  Topiramate in the past for migraines without significant benefit.  The patient was recently seen in the ER due to the persistent migraine and high blood pressure. A CT scan was performed, which did not show any signs of stroke or changes from the pressure. The patient was given a migraine cocktail and Toradol for pain, which temporarily reduced the blood pressure to 154/90 and provided temporary relief from the migraine. The patient was also prescribed Meloxicam for the migraine, but it is unclear if this has provided any benefit.  The patient is currently awaiting a follow-up with her rheumatologist to discuss the potential impact of the Xeljanz on her blood pressure and migraines. She has also been advised by a neurologist to start taking magnesium B2 and CoQ10 supplements for the migraines, but this has not yet been initiated.      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   Medication management 05/27/2019   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   Patellar dislocation 01/17/2018   Acute left ankle pain 11/27/2017   Allergic reaction 02/15/2016   Moderate persistent asthma without complication 02/15/2016   Perennial and seasonal allergic rhinitis 02/15/2016  Allergic conjunctivitis 02/15/2016   Obesity, unspecified obesity severity, unspecified obesity type 01/10/2016   Past Medical History:  Diagnosis Date   Adrenal insufficiency (HCC)    Allergic  rhinitis    Asthma    Astigmatism    Chronic kidney disease    Cushing's disease (HCC)    idiopathic   Granulomatosis with polyangiitis (HCC)    Hypertension    Neutropenia (HCC)    hospitalized in early 2023 for 9 days   Recurrent upper respiratory infection (URI)    Past Surgical History:  Procedure Laterality Date   ADENOIDECTOMY     ANTERIOR CERVICAL DECOMP/DISCECTOMY FUSION     Social History   Tobacco Use   Smoking status: Never   Smokeless tobacco: Never  Vaping Use   Vaping status: Never Used  Substance Use Topics   Alcohol use: Not Currently    Comment: was rare   Drug use: No   Social History   Socioeconomic History   Marital status: Single    Spouse name: Not on file   Number of children: 0   Years of education: Not on file   Highest education level: Some college, no degree  Occupational History   Not on file  Tobacco Use   Smoking status: Never   Smokeless tobacco: Never  Vaping Use   Vaping status: Never Used  Substance and Sexual Activity   Alcohol use: Not Currently    Comment: was rare   Drug use: No   Sexual activity: Not Currently    Birth control/protection: None  Other Topics Concern   Not on file  Social History Narrative   Lives with her parents to help take care of them    Right handed   Caffeine: small cup of coffee once every other month and then caffeine in Fioricet    Social Drivers of Health   Financial Resource Strain: Low Risk  (03/15/2023)   Overall Financial Resource Strain (CARDIA)    Difficulty of Paying Living Expenses: Not very hard  Food Insecurity: No Food Insecurity (03/15/2023)   Hunger Vital Sign    Worried About Running Out of Food in the Last Year: Never true    Ran Out of Food in the Last Year: Never true  Transportation Needs: No Transportation Needs (03/15/2023)   PRAPARE - Administrator, Civil Service (Medical): No    Lack of Transportation (Non-Medical): No  Physical Activity: Unknown  (03/15/2023)   Exercise Vital Sign    Days of Exercise per Week: 0 days    Minutes of Exercise per Session: Not on file  Stress: No Stress Concern Present (03/15/2023)   [employer] of Occupational Health - Occupational Stress Questionnaire    Feeling of Stress : Only a little  Social Connections: Moderately Integrated (03/15/2023)   Social Connection and Isolation Panel [NHANES]    Frequency of Communication with Friends and Family: More than three times a week    Frequency of Social Gatherings with Friends and Family: More than three times a week    Attends Religious Services: 1 to 4 times per year    Active Member of [employer] or Organizations: Yes    Attends Banker Meetings: 1 to 4 times per year    Marital Status: Never married  Intimate Partner Violence: Not on file   Family Status  Relation Name Status   Mother  Alive       Type I Diabetes   Father  Alive  Sister  (Not Specified)   Mat Uncle  (Not Specified)       Type I Diabetes   MGM  (Not Specified)   MGF  (Not Specified)   PGM  (Not Specified)   PGF  (Not Specified)   Neg Hx  (Not Specified)  No partnership data on file   Family History  Problem Relation Age of Onset   Diabetes Mother        Type 1 Diabetes, juvenile onset   Hypertension Mother    Pulmonary Hypertension Mother    Hyperthyroidism Mother    Glaucoma Mother    Neuropathy Mother    Sjogren's syndrome Mother    Carpal tunnel syndrome Mother    Hypertension Father    Gallbladder disease Father    Breast cancer Sister 61       metastic IBC   Diabetes Maternal Uncle    Heart attack Maternal Uncle    Stroke Maternal Uncle    Hypertension Maternal Grandmother    Glaucoma Maternal Grandmother    Coronary artery disease Maternal Grandmother    Heart attack Maternal Grandmother    Angina Maternal Grandmother    Macular degeneration Maternal Grandmother    COPD Maternal Grandmother    Congestive Heart Failure Maternal Grandmother     Glaucoma Maternal Grandfather    Leukemia Paternal Grandmother    Hypertension Paternal Grandmother    Colon cancer Paternal Grandfather    Allergic rhinitis Neg Hx    Angioedema Neg Hx    Asthma Neg Hx    Eczema Neg Hx    Pseudotumor cerebri Neg Hx    Migraines Neg Hx    Allergies  Allergen Reactions   Albuterol Shortness Of Breath and Other (See Comments)    Tolerates HFA inhaler, nebulizer treatment caused O2 drop   Other Anaphylaxis    Essential Oil, scent unknown, caused tongue swelling   Truxima [Rituximab] Anaphylaxis   Sulfa Antibiotics Hives   Vancomycin Itching    Scalp itching - felt like it was on fire      ROS    Objective:     BP (!) 160/120 (BP Location: Left Arm, Patient Position: Sitting, Cuff Size: Large)   Pulse 82   Temp 98.1 F (36.7 C) (Oral)   Resp 20   Ht 5\' 7"  (1.702 m)   Wt (!) 335 lb 9.6 oz (152.2 kg)   SpO2 97%   BMI 52.56 kg/m  BP Readings from Last 3 Encounters:  03/15/23 (!) 160/120  03/15/23 (!) 179/115  03/14/23 (!) 154/91   Wt Readings from Last 3 Encounters:  03/15/23 (!) 335 lb 9.6 oz (152.2 kg)  03/15/23 (!) 334 lb (151.5 kg)  03/13/23 (!) 320 lb 12.3 oz (145.5 kg)   SpO2 Readings from Last 3 Encounters:  03/15/23 97%  03/14/23 93%  11/29/22 98%      Physical Exam Vitals and nursing note reviewed.  Constitutional:      General: She is not in acute distress.    Appearance: Normal appearance. She is well-developed.  HENT:     Head: Normocephalic and atraumatic.     Right Ear: Tympanic membrane, ear canal and external ear normal. There is no impacted cerumen.     Left Ear: Tympanic membrane, ear canal and external ear normal. There is no impacted cerumen.     Nose: Nose normal.     Mouth/Throat:     Mouth: Mucous membranes are moist.     Pharynx: Oropharynx is  clear. No oropharyngeal exudate or posterior oropharyngeal erythema.  Eyes:     General: No scleral icterus.       Right eye: No discharge.         Left eye: No discharge.     Conjunctiva/sclera: Conjunctivae normal.     Pupils: Pupils are equal, round, and reactive to light.  Neck:     Thyroid: No thyromegaly or thyroid tenderness.     Vascular: No JVD.  Cardiovascular:     Rate and Rhythm: Normal rate and regular rhythm.     Heart sounds: Normal heart sounds. No murmur heard. Pulmonary:     Effort: Pulmonary effort is normal. No respiratory distress.     Breath sounds: Normal breath sounds.  Abdominal:     General: Bowel sounds are normal. There is no distension.     Palpations: Abdomen is soft. There is no mass.     Tenderness: There is no abdominal tenderness. There is no guarding or rebound.  Genitourinary:    Vagina: Normal.  Musculoskeletal:        General: Normal range of motion.     Cervical back: Normal range of motion and neck supple.     Right lower leg: No edema.     Left lower leg: No edema.  Lymphadenopathy:     Cervical: No cervical adenopathy.  Skin:    General: Skin is warm and dry.     Findings: No erythema or rash.  Neurological:     Mental Status: She is alert and oriented to person, place, and time.     Cranial Nerves: No cranial nerve deficit.     Deep Tendon Reflexes: Reflexes are normal and symmetric.  Psychiatric:        Mood and Affect: Mood normal.        Behavior: Behavior normal.        Thought Content: Thought content normal.        Judgment: Judgment normal.      No results found for any visits on 03/15/23.  Last CBC Lab Results  Component Value Date   WBC 22.5 (H) 06/15/2021   HGB 12.0 06/15/2021   HCT 37.5 06/15/2021   MCV 84.3 06/15/2021   MCH 27.0 06/15/2021   RDW 14.6 06/15/2021   PLT 101 (L) 06/15/2021   Last metabolic panel Lab Results  Component Value Date   GLUCOSE 89 06/15/2021   NA 136 06/15/2021   K 4.4 06/15/2021   CL 102 06/15/2021   CO2 25 06/15/2021   BUN 14 06/15/2021   CREATININE 0.99 06/15/2021   GFRNONAA >60 06/15/2021   CALCIUM 8.2 (L)  06/15/2021   PROT 6.7 06/15/2021   ALBUMIN 3.3 (L) 06/15/2021   BILITOT 0.2 (L) 06/15/2021   ALKPHOS 69 06/15/2021   AST 28 06/15/2021   ALT 26 06/15/2021   ANIONGAP 9 06/15/2021   Last lipids Lab Results  Component Value Date   CHOL 177 05/16/2018   HDL 52.00 05/16/2018   LDLCALC 108 (H) 05/16/2018   TRIG 83.0 05/16/2018   CHOLHDL 3 05/16/2018   Last hemoglobin A1c Lab Results  Component Value Date   HGBA1C 4.4 (L) 05/19/2019   Last thyroid functions Lab Results  Component Value Date   TSH 1.60 10/20/2020   Last vitamin D Lab Results  Component Value Date   VD25OH 56.53 10/20/2020   Last vitamin B12 and Folate Lab Results  Component Value Date   VITAMINB12 371 06/04/2021   FOLATE 43.7 06/04/2021  The ASCVD Risk score (Arnett DK, et al., 2019) failed to calculate for the following reasons:   Cannot find a previous HDL lab   Cannot find a previous total cholesterol lab    Assessment & Plan:   Problem List Items Addressed This Visit       Unprioritized   Essential (primary) hypertension   Poorly controlled will alter medications, encouraged DASH diet, minimize caffeine and obtain adequate sleep. Report concerning symptoms and follow up as directed and as needed  Inc metoprolol to 50 mg bid  F/u 1 week       Relevant Medications   metoprolol tartrate (LOPRESSOR) 50 MG tablet   Other Visit Diagnoses       Essential hypertension    -  Primary   Relevant Medications   metoprolol tartrate (LOPRESSOR) 50 MG tablet     Chest pain, unspecified type       Relevant Orders   EKG 12-Lead (Completed)     Chronic migraine w/o aura, not intractable, w/o stat migr       Relevant Medications   metoprolol tartrate (LOPRESSOR) 50 MG tablet   Ubrogepant (UBRELVY) 100 MG TABS   meloxicam (MOBIC) 15 MG tablet   ketorolac (TORADOL) injection 60 mg (Completed)     Assessment and Plan    Hypertension: Uncontrolled despite Losartan 75mg  daily and Metoprolol 25mg   twice daily. Recent addition of Metoprolol may have contributed to migraines. -Increase Metoprolol to 50mg  twice daily. -Continue Losartan 75mg  daily. -Monitor blood pressure and heart rate at home.  Migraine: Persistent since last Wednesday, not responsive to Toradol or Meloxicam. -Administer Toradol shot today. -Continue Meloxicam 15mg  as needed. -Start magnesium, B2, and CoQ10 supplements as recommended by neurologist. -Follow up with neurologist after 4-6 weeks when blood pressure is more stable.  Rheumatoid Arthritis: Recently started on Xeljanz, a JAK inhibitor that can cause hypertension. -Continue Harriette Ohara as prescribed by rheumatologist. -Follow up with rheumatologist on Monday.  Neck Pain: Described as tightness in the throat, possibly related to posture or tension. -Consider massage therapy. -Continue current pain management regimen.  Follow up with primary care provider on Monday for blood pressure check and possible medication adjustment.        Return in about 1 week (around 03/22/2023), or if symptoms worsen or fail to improve, for hypertension.    Donato Schultz, DO

## 2023-03-15 NOTE — Telephone Encounter (Signed)
Appt scheduled w/ Dr Laury Axon at 3:40pm.

## 2023-03-15 NOTE — Assessment & Plan Note (Signed)
Poorly controlled will alter medications, encouraged DASH diet, minimize caffeine and obtain adequate sleep. Report concerning symptoms and follow up as directed and as needed  Inc metoprolol to 50 mg bid  F/u 1 week

## 2023-03-15 NOTE — Patient Instructions (Signed)

## 2023-03-15 NOTE — Telephone Encounter (Signed)
Copied from CRM [id number]. Topic: Clinical - Pink Word Triage >> Mar 15, 2023 10:26 AM Gurney Maxin H wrote: Reason for Triage: Patient states she had an appointment this morning with Neuro and her blood pressure was 179/115 was told to follow up with PCP as soon as possible. PCP is aware of elevated blood pressure sent patient to ER on Tuesday.   Chief Complaint: Elevated BP Symptoms: high blood pressure and headache Frequency: Ongoing Pertinent Negatives: Patient denies chest pain or visual changes Disposition: [] ED /[] Urgent Care (no appt availability in office) / [x] Appointment(In office/virtual)/ []  [facility]/ [] Home Care/ [] Refused Recommended Disposition /[] [facility]/ []  Follow-up with PCP  Additional Notes: Patient is currently experiencing elevated blood pressure and headache. She had a neurology appt today with a BP reading of 179/115, and they advised patient to follow up with her PCP. Patient stated she has been taking Metoprolol since Monday (prescribed by her Endocrinologist due to very elevated BP at appt). She is taking 25 mg twice daily and has not missed doses. She stated she has a hx of hypertension and is also taking Losartan Potassium 75 mg daily. Her blood pressure is normally went controlled up until recently. Patient has been having a constant migraine for the past week. Her PCP advised her to go to ED on 12/17 and she went as instructed. Same day follow up appt has been scheduled with PCP.  Reason for Disposition  Systolic BP  >= 180 OR Diastolic >= 110  Answer Assessment - Initial Assessment Questions 1. BLOOD PRESSURE: "What is the blood pressure?" "Did you take at least two measurements 5 minutes apart?"     179/115 2. ONSET: "When did you take your blood pressure?"     30 minute ago 3. HOW: "How did you take your blood pressure?" (e.g., automatic home BP monitor, visiting nurse)     Was checked at doctor's office at Neurology appt today   4.  HISTORY: "Do you have a history of high blood pressure?"     Yes, and it has been well controlled until recently   5. MEDICINES: "Are you taking any medicines for blood pressure?" "Have you missed any doses recently?"     Metoprolol and Losartan Potassium. Has not missed doses  6. OTHER SYMPTOMS: "Do you have any symptoms?" (e.g., blurred vision, chest pain, difficulty breathing, headache, weakness)     Has a headache, pain level 5/10, not new (pt reported hx of migraines).  Protocols used: Blood Pressure - High-A-AH

## 2023-03-19 DIAGNOSIS — L659 Nonscarring hair loss, unspecified: Secondary | ICD-10-CM | POA: Diagnosis not present

## 2023-03-19 DIAGNOSIS — M3130 Wegener's granulomatosis without renal involvement: Secondary | ICD-10-CM | POA: Diagnosis not present

## 2023-03-19 DIAGNOSIS — M0579 Rheumatoid arthritis with rheumatoid factor of multiple sites without organ or systems involvement: Secondary | ICD-10-CM | POA: Diagnosis not present

## 2023-03-19 DIAGNOSIS — E2740 Unspecified adrenocortical insufficiency: Secondary | ICD-10-CM | POA: Diagnosis not present

## 2023-03-22 ENCOUNTER — Ambulatory Visit: Payer: BC Managed Care – PPO | Admitting: Family Medicine

## 2023-03-22 ENCOUNTER — Encounter: Payer: Self-pay | Admitting: Family Medicine

## 2023-03-22 VITALS — BP 157/102 | HR 84 | Temp 98.6°F | Resp 16 | Ht 67.0 in | Wt 336.0 lb

## 2023-03-22 DIAGNOSIS — I10 Essential (primary) hypertension: Secondary | ICD-10-CM

## 2023-03-22 DIAGNOSIS — J0100 Acute maxillary sinusitis, unspecified: Secondary | ICD-10-CM | POA: Diagnosis not present

## 2023-03-22 MED ORDER — AMOXICILLIN-POT CLAVULANATE 875-125 MG PO TABS
1.0000 | ORAL_TABLET | Freq: Two times a day (BID) | ORAL | 0 refills | Status: DC
Start: 2023-03-22 — End: 2023-04-04

## 2023-03-22 MED ORDER — METOPROLOL TARTRATE 100 MG PO TABS
100.0000 mg | ORAL_TABLET | Freq: Two times a day (BID) | ORAL | 3 refills | Status: DC
Start: 2023-03-22 — End: 2023-04-04

## 2023-03-22 NOTE — Assessment & Plan Note (Signed)
Poorly controlled will alter medications, encouraged DASH diet, minimize caffeine and obtain adequate sleep. Report concerning symptoms and follow up as directed and as needed  Metoprolol 100 mg bid  F/u 1-2 weeks

## 2023-03-22 NOTE — Patient Instructions (Signed)
Hypertension, Adult High blood pressure (hypertension) is when the force of blood pumping through the arteries is too strong. The arteries are the blood vessels that carry blood from the heart throughout the body. Hypertension forces the heart to work harder to pump blood and may cause arteries to become narrow or stiff. Untreated or uncontrolled hypertension can lead to a heart attack, heart failure, a stroke, kidney disease, and other problems. A blood pressure reading consists of a higher number over a lower number. Ideally, your blood pressure should be below 120/80. The first ("top") number is called the systolic pressure. It is a measure of the pressure in your arteries as your heart beats. The second ("bottom") number is called the diastolic pressure. It is a measure of the pressure in your arteries as the heart relaxes. What are the causes? The exact cause of this condition is not known. There are some conditions that result in high blood pressure. What increases the risk? Certain factors may make you more likely to develop high blood pressure. Some of these risk factors are under your control, including: Smoking. Not getting enough exercise or physical activity. Being overweight. Having too much fat, sugar, calories, or salt (sodium) in your diet. Drinking too much alcohol. Other risk factors include: Having a personal history of heart disease, diabetes, high cholesterol, or kidney disease. Stress. Having a family history of high blood pressure and high cholesterol. Having obstructive sleep apnea. Age. The risk increases with age. What are the signs or symptoms? High blood pressure may not cause symptoms. Very high blood pressure (hypertensive crisis) may cause: Headache. Fast or irregular heartbeats (palpitations). Shortness of breath. Nosebleed. Nausea and vomiting. Vision changes. Severe chest pain, dizziness, and seizures. How is this diagnosed? This condition is diagnosed by  measuring your blood pressure while you are seated, with your arm resting on a flat surface, your legs uncrossed, and your feet flat on the floor. The cuff of the blood pressure monitor will be placed directly against the skin of your upper arm at the level of your heart. Blood pressure should be measured at least twice using the same arm. Certain conditions can cause a difference in blood pressure between your right and left arms. If you have a high blood pressure reading during one visit or you have normal blood pressure with other risk factors, you may be asked to: Return on a different day to have your blood pressure checked again. Monitor your blood pressure at home for 1 week or longer. If you are diagnosed with hypertension, you may have other blood or imaging tests to help your health care provider understand your overall risk for other conditions. How is this treated? This condition is treated by making healthy lifestyle changes, such as eating healthy foods, exercising more, and reducing your alcohol intake. You may be referred for counseling on a healthy diet and physical activity. Your health care provider may prescribe medicine if lifestyle changes are not enough to get your blood pressure under control and if: Your systolic blood pressure is above 130. Your diastolic blood pressure is above 80. Your personal target blood pressure may vary depending on your medical conditions, your age, and other factors. Follow these instructions at home: Eating and drinking  Eat a diet that is high in fiber and potassium, and low in sodium, added sugar, and fat. An example of this eating plan is called the DASH diet. DASH stands for Dietary Approaches to Stop Hypertension. To eat this way: Eat   plenty of fresh fruits and vegetables. Try to fill one half of your plate at each meal with fruits and vegetables. Eat whole grains, such as whole-wheat pasta, brown rice, or whole-grain bread. Fill about one  fourth of your plate with whole grains. Eat or drink low-fat dairy products, such as skim milk or low-fat yogurt. Avoid fatty cuts of meat, processed or cured meats, and poultry with skin. Fill about one fourth of your plate with lean proteins, such as fish, chicken without skin, beans, eggs, or tofu. Avoid pre-made and processed foods. These tend to be higher in sodium, added sugar, and fat. Reduce your daily sodium intake. Many people with hypertension should eat less than 1,500 mg of sodium a day. Do not drink alcohol if: Your health care provider tells you not to drink. You are pregnant, may be pregnant, or are planning to become pregnant. If you drink alcohol: Limit how much you have to: 0-1 drink a day for women. 0-2 drinks a day for men. Know how much alcohol is in your drink. In the [place] one drink equals one 12 oz bottle of beer (355 mL), one 5 oz glass of wine (148 mL), or one 1 oz glass of hard liquor (44 mL). Lifestyle  Work with your health care provider to maintain a healthy body weight or to lose weight. Ask what an ideal weight is for you. Get at least 30 minutes of exercise that causes your heart to beat faster (aerobic exercise) most days of the week. Activities may include walking, swimming, or biking. Include exercise to strengthen your muscles (resistance exercise), such as Pilates or lifting weights, as part of your weekly exercise routine. Try to do these types of exercises for 30 minutes at least 3 days a week. Do not use any products that contain nicotine or tobacco. These products include cigarettes, chewing tobacco, and vaping devices, such as e-cigarettes. If you need help quitting, ask your health care provider. Monitor your blood pressure at home as told by your health care provider. Keep all follow-up visits. This is important. Medicines Take over-the-counter and prescription medicines only as told by your health care provider. Follow directions carefully. Blood  pressure medicines must be taken as prescribed. Do not skip doses of blood pressure medicine. Doing this puts you at risk for problems and can make the medicine less effective. Ask your health care provider about side effects or reactions to medicines that you should watch for. Contact a health care provider if you: Think you are having a reaction to a medicine you are taking. Have headaches that keep coming back (recurring). Feel dizzy. Have swelling in your ankles. Have trouble with your vision. Get help right away if you: Develop a severe headache or confusion. Have unusual weakness or numbness. Feel faint. Have severe pain in your chest or abdomen. Vomit repeatedly. Have trouble breathing. These symptoms may be an emergency. Get help right away. Call 911. Do not wait to see if the symptoms will go away. Do not drive yourself to the hospital. Summary Hypertension is when the force of blood pumping through your arteries is too strong. If this condition is not controlled, it may put you at risk for serious complications. Your personal target blood pressure may vary depending on your medical conditions, your age, and other factors. For most people, a normal blood pressure is less than 120/80. Hypertension is treated with lifestyle changes, medicines, or a combination of both. Lifestyle changes include losing weight, eating a healthy,   low-sodium diet, exercising more, and limiting alcohol. This information is not intended to replace advice given to you by your health care provider. Make sure you discuss any questions you have with your health care provider. Document Revised: 01/18/2021 Document Reviewed: 01/18/2021 Elsevier Patient Education  2024 Elsevier Inc.  

## 2023-03-22 NOTE — Progress Notes (Signed)
Established Patient Office Visit  Subjective   Patient ID: Diane Wells, female    DOB: 01-31-1981  Age: 42 y.o. MRN: [id number]  Chief Complaint  Patient presents with   Hypertension    Here for follow up   Headache    Still complaining of headaches, "better but not gone"   Sinus Problem    Complains of sinus pressure on the right side of face and behind ear    HPI Discussed the use of AI scribe software for clinical note transcription with the patient, who gave verbal consent to proceed.  History of Present Illness   The patient, with a history of hypertension and autoimmune disease, presents with persistent headaches, which have improved from a migraine level to a headache level. She also reports a slight improvement in blood pressure. The patient recently saw her rheumatologist, who conducted labs, the results of which showed a slight increase in creatinine and high neutrophils. The patient also reports that her blood pressure increases upon physical exertion, such as going to the bathroom.  The patient has been on metoprolol 50, which was recently increased, and she reports feeling tired, although it's unclear if this is a side effect of the medication. She also received a shot for her headache, which provided relief for approximately six hours. The patient has been taking ibuprofen for pain relief, as meloxicam was not effective.  The patient also reports possible sinus issues, with thick, bloody mucus from the right nostril. She has a history of sinus infections and autoimmune disease affecting the sinuses. She uses Flonase, Allegra, and a saline wash for symptom management. The patient reports pain in the back of the neck, right side of the face, and behind the right ear, which she attributes to sinus issues.  The patient's blood pressure has been fluctuating, with readings as high as 189/132. The patient has been monitoring her blood pressure and pulse at home, and notes that  her blood pressure tends to increase as it approaches time to take her medication.       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   Medication management 05/27/2019   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   Patellar dislocation 01/17/2018   Acute left ankle pain 11/27/2017   Allergic reaction 02/15/2016   Moderate persistent asthma without complication 02/15/2016   Perennial and seasonal allergic rhinitis 02/15/2016   Allergic conjunctivitis 02/15/2016   Obesity, unspecified obesity severity, unspecified obesity type 01/10/2016   Past Medical History:  Diagnosis Date   Adrenal insufficiency (HCC)    Allergic rhinitis    Asthma    Astigmatism    Chronic kidney disease    Cushing's disease (HCC)    idiopathic   Granulomatosis with polyangiitis (HCC)    Hypertension    Neutropenia (HCC)    hospitalized in early 2023 for 9 days   Recurrent upper respiratory infection (URI)    Past Surgical History:  Procedure Laterality Date   ADENOIDECTOMY     ANTERIOR CERVICAL DECOMP/DISCECTOMY FUSION  Social History   Tobacco Use   Smoking status: Never   Smokeless tobacco: Never  Vaping Use   Vaping status: Never Used  Substance Use Topics   Alcohol use: Not Currently    Comment: was rare   Drug use: No   Social History   Socioeconomic History   Marital status: Single    Spouse name: Not on file   Number of children: 0   Years of education:  Not on file   Highest education level: Some college, no degree  Occupational History   Not on file  Tobacco Use   Smoking status: Never   Smokeless tobacco: Never  Vaping Use   Vaping status: Never Used  Substance and Sexual Activity   Alcohol use: Not Currently    Comment: was rare   Drug use: No   Sexual activity: Not Currently    Birth control/protection: None  Other Topics Concern   Not on file  Social History Narrative   Lives with her parents to help take care of them    Right handed   Caffeine: small cup of coffee once every other month and then caffeine in [place]    Social Drivers of Health   Financial Resource Strain: Low Risk  (03/15/2023)   Overall Financial Resource Strain (CARDIA)    Difficulty of Paying Living Expenses: Not very hard  Food Insecurity: No Food Insecurity (03/15/2023)   Hunger Vital Sign    Worried About Running Out of Food in the Last Year: Never true    Ran Out of Food in the Last Year: Never true  Transportation Needs: No Transportation Needs (03/15/2023)   PRAPARE - Administrator, Civil Service (Medical): No    Lack of Transportation (Non-Medical): No  Physical Activity: Unknown (03/15/2023)   Exercise Vital Sign    Days of Exercise per Week: 0 days    Minutes of Exercise per Session: Not on file  Stress: No Stress Concern Present (03/15/2023)   [employer] of Occupational Health - Occupational Stress Questionnaire    Feeling of Stress : Only a little  Social Connections: Moderately Integrated (03/15/2023)   Social Connection and Isolation Panel [NHANES]    Frequency of Communication with Friends and Family: More than three times a week    Frequency of Social Gatherings with Friends and Family: More than three times a week    Attends Religious Services: 1 to 4 times per year    Active Member of [employer] or Organizations: Yes    Attends Banker Meetings: 1 to 4 times per year    Marital Status: Never  married  Catering manager Violence: Not on file   Family Status  Relation Name Status   Mother  Alive       Type I Diabetes   Father  Alive   Sister  (Not Specified)   Nurse, mental health  (Not Specified)       Type I Diabetes   MGM  (Not Specified)   MGF  (Not Specified)   PGM  (Not Specified)   PGF  (Not Specified)   Neg Hx  (Not Specified)  No partnership data on file   Family History  Problem Relation Age of Onset   Diabetes Mother        Type 1 Diabetes, juvenile onset   Hypertension Mother    Pulmonary Hypertension Mother    Hyperthyroidism Mother    Glaucoma Mother    Neuropathy Mother  Sjogren's syndrome Mother    Carpal tunnel syndrome Mother    Hypertension Father    Gallbladder disease Father    Breast cancer Sister 81       metastic IBC   Diabetes Maternal Uncle    Heart attack Maternal Uncle    Stroke Maternal Uncle    Hypertension Maternal Grandmother    Glaucoma Maternal Grandmother    Coronary artery disease Maternal Grandmother    Heart attack Maternal Grandmother    Angina Maternal Grandmother    Macular degeneration Maternal Grandmother    COPD Maternal Grandmother    Congestive Heart Failure Maternal Grandmother    Glaucoma Maternal Grandfather    Leukemia Paternal Grandmother    Hypertension Paternal Grandmother    Colon cancer Paternal Grandfather    Allergic rhinitis Neg Hx    Angioedema Neg Hx    Asthma Neg Hx    Eczema Neg Hx    Pseudotumor cerebri Neg Hx    Migraines Neg Hx    Allergies  Allergen Reactions   Albuterol Shortness Of Breath and Other (See Comments)    Tolerates HFA inhaler, nebulizer treatment caused O2 drop   Other Anaphylaxis    Essential Oil, scent unknown, caused tongue swelling   Truxima [Rituximab] Anaphylaxis   Sulfa Antibiotics Hives   Vancomycin Itching    Scalp itching - felt like it was on fire      Review of Systems  Constitutional:  Negative for fever and malaise/fatigue.  HENT:  Negative for  congestion.   Eyes:  Negative for blurred vision.  Respiratory:  Negative for shortness of breath.   Cardiovascular:  Negative for chest pain, palpitations and leg swelling.  Gastrointestinal:  Negative for abdominal pain, blood in stool and nausea.  Genitourinary:  Negative for dysuria and frequency.  Musculoskeletal:  Negative for falls.  Skin:  Negative for rash.  Neurological:  Negative for dizziness, loss of consciousness and headaches.  Endo/Heme/Allergies:  Negative for environmental allergies.  Psychiatric/Behavioral:  Negative for depression. The patient is not nervous/anxious.       Objective:     BP (!) 157/102 (BP Location: Left Arm, Patient Position: Sitting, Cuff Size: Large)   Pulse 84   Temp 98.6 F (37 C) (Oral)   Resp 16   Ht 5\' 7"  (1.702 m)   Wt (!) 336 lb (152.4 kg)   SpO2 100%   BMI 52.63 kg/m  BP Readings from Last 3 Encounters:  03/22/23 (!) 157/102  03/15/23 (!) 160/120  03/15/23 (!) 179/115   Wt Readings from Last 3 Encounters:  03/22/23 (!) 336 lb (152.4 kg)  03/15/23 (!) 335 lb 9.6 oz (152.2 kg)  03/15/23 (!) 334 lb (151.5 kg)   SpO2 Readings from Last 3 Encounters:  03/22/23 100%  03/15/23 97%  03/14/23 93%      Physical Exam Vitals and nursing note reviewed.  Constitutional:      General: She is not in acute distress.    Appearance: Normal appearance. She is well-developed.  HENT:     Head: Normocephalic and atraumatic.     Nose:     Right Sinus: Frontal sinus tenderness present. No maxillary sinus tenderness.     Left Sinus: No maxillary sinus tenderness or frontal sinus tenderness.  Eyes:     General: No scleral icterus.       Right eye: No discharge.        Left eye: No discharge.  Cardiovascular:     Rate and Rhythm:  Normal rate and regular rhythm.     Heart sounds: No murmur heard. Pulmonary:     Effort: Pulmonary effort is normal. No respiratory distress.     Breath sounds: Normal breath sounds.  Musculoskeletal:         General: Normal range of motion.     Cervical back: Normal range of motion and neck supple.     Right lower leg: No edema.     Left lower leg: No edema.  Skin:    General: Skin is warm and dry.  Neurological:     Mental Status: She is alert and oriented to person, place, and time.  Psychiatric:        Mood and Affect: Mood normal.        Behavior: Behavior normal.        Thought Content: Thought content normal.        Judgment: Judgment normal.      No results found for any visits on 03/22/23.  Last CBC Lab Results  Component Value Date   WBC 22.5 (H) 06/15/2021   HGB 12.0 06/15/2021   HCT 37.5 06/15/2021   MCV 84.3 06/15/2021   MCH 27.0 06/15/2021   RDW 14.6 06/15/2021   PLT 101 (L) 06/15/2021   Last metabolic panel Lab Results  Component Value Date   GLUCOSE 89 06/15/2021   NA 136 06/15/2021   K 4.4 06/15/2021   CL 102 06/15/2021   CO2 25 06/15/2021   BUN 14 06/15/2021   CREATININE 0.99 06/15/2021   GFRNONAA >60 06/15/2021   CALCIUM 8.2 (L) 06/15/2021   PROT 6.7 06/15/2021   ALBUMIN 3.3 (L) 06/15/2021   BILITOT 0.2 (L) 06/15/2021   ALKPHOS 69 06/15/2021   AST 28 06/15/2021   ALT 26 06/15/2021   ANIONGAP 9 06/15/2021   Last lipids Lab Results  Component Value Date   CHOL 177 05/16/2018   HDL 52.00 05/16/2018   LDLCALC 108 (H) 05/16/2018   TRIG 83.0 05/16/2018   CHOLHDL 3 05/16/2018   Last hemoglobin A1c Lab Results  Component Value Date   HGBA1C 4.4 (L) 05/19/2019   Last thyroid functions Lab Results  Component Value Date   TSH 1.60 10/20/2020   Last vitamin D Lab Results  Component Value Date   VD25OH 56.53 10/20/2020   Last vitamin B12 and Folate Lab Results  Component Value Date   VITAMINB12 371 06/04/2021   FOLATE 43.7 06/04/2021      The ASCVD Risk score (Arnett DK, et al., 2019) failed to calculate for the following reasons:   Cannot find a previous HDL lab   Cannot find a previous total cholesterol lab    Assessment &  Plan:   Problem List Items Addressed This Visit       Unprioritized   Essential (primary) hypertension   Poorly controlled will alter medications, encouraged DASH diet, minimize caffeine and obtain adequate sleep. Report concerning symptoms and follow up as directed and as needed  Metoprolol 100 mg bid  F/u 1-2 weeks      Relevant Medications   metoprolol tartrate (LOPRESSOR) 100 MG tablet   Other Visit Diagnoses       Acute non-recurrent maxillary sinusitis    -  Primary   Relevant Medications   amoxicillin-clavulanate (AUGMENTIN) 875-125 MG tablet     Essential hypertension       Relevant Medications   metoprolol tartrate (LOPRESSOR) 100 MG tablet     Assessment and Plan    Hypertension Despite  recent adjustments, her hypertension remains poorly controlled with blood pressure readings ranging from 189/132 to 138/81 while on metoprolol and losartan. She reports slight improvement but significant fluctuations persist. We discussed and agreed to increase metoprolol to 75 mg to help manage her blood pressure and headaches, informing her of potential side effects such as fatigue and bradycardia. She will monitor her blood pressure at home and follow up in 1-2 weeks.  Tension Headache She experiences chronic tension headaches, primarily in the neck, with some improvement from metoprolol. Ibuprofen provides temporary relief, whereas meloxicam was ineffective. After discussing NSAID risks, including potential blood pressure increase, she prefers to discontinue meloxicam and continue using ibuprofen as needed. The increase in metoprolol to 75 mg will also support headache management.  Sinusitis She presents with symptoms suggestive of sinusitis, including thick, bloody mucus from the right nostril, facial pain, and dizziness when bending over, against a background of granulomatosis with polyangiitis (GPA) affecting the sinuses and frequent sinus infections. She will continue using Flonase,  Allegra, and saline wash, with a follow-up with her pulmonologist if symptoms persist.  General Health Maintenance We discussed routine health maintenance and the importance of follow-up with specialists. She will follow up with Dr. Dallas Schimke in 1-2 weeks and continue to monitor her blood pressure and symptoms at home.        No follow-ups on file.    Donato Schultz, DO

## 2023-04-01 NOTE — Progress Notes (Addendum)
 [facility] [address], [address] [place], [place] [address] [phone]  Date:  04/04/2023   Name:  Diane Wells   DOB:  11/26/1980   MRN:  [id number]  PCP:  Watt Harlene BROCKS, MD    Chief Complaint: Follow-up (Concerns/ questions: check A1c- preDM- since she has gained weight d/t steroids. )   History of Present Illness:  KRISTILYN COLTRANE is a 43 y.o. 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 Ishmael at [facility] Dr. Buck in Neurology Dr. 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 Norine.    She has been dealing with very difficult to control headaches.  She was seen in the ER with headache on December 18, and then followed up with neurology the next day: Recurrent headache Chronic migraine without aura, intractable, without status migrainosus Chronic neck pain Hypertension, unspecified type Charmaine FORBES Drape has had worsening headaches for several months. BP has been elevated consistently over the past month despite taking losartan  consistently. She was recently started on metoprolol  and seems to be tolerating it well. BP in the office 179/115. I have encouraged her to contact her PCP immediately to discuss increasing dose of metoprolol . We can certainly consider  repeat LP, however, most recent eye exam 12/2022 was not concerning for papilledema. Once BP is better managed we will consider repeating LP if headaches continue. I have given her Nurtec samples to help with migraine abortion while working on BP management. I have also suggested she consider adding magnesium  supplements if approved by her care team. Healthy lifestyle habits encouraged. She will follow up with PCP as directed. She will return to see me in 6 months but will reach out via Mychart in 4-6 weeks if headaches are not improving despite BP management.  She saw my partner Dr. Antonio Meth on December 19 for blood pressure control adjustment They increased her dose of metoprolol  from 50 to 75 BID She saw Dr. Antonio Meth again on December 26 with sinus infection symptoms-again increase metoprolol  and treated with Augmentin   Albuterol  as needed Fioricet , cyclobenzaprine  on hand Allegra, Flonase , Dulera  Lasix  20 to 40 mg by mouth daily as needed for swelling Gabapentin  300-600 3 times daily as needed Cortef  10 mg daily Plaquenil ?? Losartan  75 daily Metoprolol  75 mg twice daily- this seems to be controlling her BP well and her HA are better Ubrelvy  as needed Xeljnz  She notes her BP is overall pretty steady and under good control on her current dose of metoprolol .  Also, her headaches have improved HR lowest 57- generally 60s.  She is not having symptoms of bradycardia She has ubrelvy  at home and can use this when she next has a HA   Patient is concerned about her weight, she wonders about using a GLP-1 for weight loss.  She has no personal or family history of thyroid  cancer or multiple endocrine neoplasia syndrome.  There is questionable history of pancreatitis, but it sounds like this was actually gallbladder colic in the past  Lab Results  Component Value Date   HGBA1C 4.4 (L) 05/19/2019    Patient Active Problem List   Diagnosis Date Noted   Neutropenia (HCC) 06/15/2021   Cyst  of ovary, right 06/05/2021   Left thyroid  nodule 06/05/2021   Neutropenic fever (HCC) 06/04/2021   Lymphadenopathy 06/03/2021   Mouth sores 06/03/2021   SIRS (systemic inflammatory response syndrome) (HCC) 06/03/2021   Essential (primary) hypertension 05/31/2021   Iatrogenic Cushing's syndrome (HCC) 05/31/2021   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/31/2021   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   Medication management 05/27/2019   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   Patellar dislocation 01/17/2018   Acute left ankle pain 11/27/2017   Allergic reaction 02/15/2016   Moderate persistent asthma without complication 02/15/2016   Perennial and seasonal allergic rhinitis 02/15/2016   Allergic conjunctivitis 02/15/2016   Obesity, unspecified obesity severity, unspecified obesity type 01/10/2016    Past Medical History:  Diagnosis Date   Adrenal insufficiency (HCC)    Allergic rhinitis    Asthma    Astigmatism    Chronic kidney disease    Cushing's disease (HCC)    idiopathic   Granulomatosis with polyangiitis (HCC)    Hypertension    Neutropenia (HCC)    hospitalized in early 2023 for 9 days   Recurrent upper respiratory infection (URI)     Past Surgical History:  Procedure Laterality Date   ADENOIDECTOMY     ANTERIOR CERVICAL DECOMP/DISCECTOMY FUSION      Social History   Tobacco Use   Smoking status: Never   Smokeless tobacco: Never  Vaping Use   Vaping status: Never Used  Substance Use Topics   Alcohol use: Not Currently    Comment: was rare   Drug use: No    Family History  Problem Relation Age of Onset   Diabetes Mother        Type 1 Diabetes, juvenile onset   Hypertension Mother    Pulmonary Hypertension Mother    Hyperthyroidism Mother    Glaucoma Mother    Neuropathy Mother    Sjogren's syndrome Mother    Carpal tunnel syndrome Mother    Hypertension Father     Gallbladder disease Father    Breast cancer Sister 7       metastic IBC   Diabetes Maternal Uncle    Heart attack Maternal Uncle    Stroke Maternal Uncle    Hypertension Maternal Grandmother    Glaucoma Maternal Grandmother    Coronary artery disease Maternal Grandmother    Heart attack Maternal Grandmother    Angina Maternal Grandmother    Macular degeneration Maternal Grandmother    COPD Maternal Grandmother    Congestive Heart Failure Maternal Grandmother    Glaucoma Maternal Grandfather    Leukemia Paternal Grandmother    Hypertension Paternal Grandmother    Colon cancer Paternal Grandfather    Allergic rhinitis Neg Hx    Angioedema Neg Hx    Asthma Neg Hx    Eczema Neg Hx    Pseudotumor cerebri Neg Hx    Migraines Neg Hx     Allergies  Allergen Reactions   Albuterol  Shortness Of Breath and Other (See Comments)    Tolerates HFA inhaler, nebulizer treatment caused O2 drop   Other Anaphylaxis    Essential Oil, scent unknown, caused tongue swelling  Truxima  [Rituximab ] Anaphylaxis   Sulfa  Antibiotics Hives   Vancomycin  Itching    Scalp itching - felt like it was on fire    Medication list has been reviewed and updated.  Current Outpatient Medications on File Prior to Visit  Medication Sig Dispense Refill   albuterol  (VENTOLIN  HFA) 108 (90 Base) MCG/ACT inhaler INHALE 1-2 PUFFS INTO THE LUNGS DAILY AS NEEDED FOR SHORTNESS OF BREATH. DAW 18 each 2   butalbital -acetaminophen -caffeine  (FIORICET ) 50-325-40 MG tablet TAKE 1 TABLET BY MOUTH EVERY 6 HOURS AS NEEDED FOR HEADACHE 60 tablet 0   cyclobenzaprine  (FLEXERIL ) 10 MG tablet Take 1 tablet (10 mg total) by mouth at bedtime. Use as needed for muscle spasm and headache 90 tablet 1   cyclobenzaprine  (FLEXERIL ) 5 MG tablet Take 5- 10 mg at bedtime as needed for headache and muscle spasm 60 tablet 3   EPINEPHrine  0.3 mg/0.3 mL IJ SOAJ injection Inject 0.3 mg into the muscle once as needed for anaphylaxis.     fexofenadine  (ALLEGRA) 180 MG tablet Take 180 mg by mouth every morning.     furosemide  (LASIX ) 20 MG tablet TAKE 1 (ONE) TABLET DAILY AS NEEDED (Patient taking differently: Take 20-40 mg by mouth daily as needed (swelling).) 90 tablet 0   gabapentin  (NEURONTIN ) 300 MG capsule Take 1-2 capsules (300-600 mg total) by mouth 3 (three) times daily as needed. 270 capsule 1   hydrocortisone  (CORTEF ) 10 MG tablet Take 10 mg by mouth every morning.     hydroxychloroquine  (PLAQUENIL ) 200 MG tablet Take 1 tablet (200 mg total) by mouth 2 (two) times daily. Hold until ok with your rheumatologist     ketorolac  (ACULAR ) 0.5 % ophthalmic solution 1 drop See admin instructions. Apply one drop into left eye daily at bedtime, may also use up to three more times daily in affected eye(s) as needed for scleritis     losartan  (COZAAR ) 50 MG tablet Take 1.5 tablets (75 mg total) by mouth daily. 135 tablet 3   meloxicam  (MOBIC ) 15 MG tablet Take 1 tablet (15 mg total) by mouth daily. 30 tablet 2   methocarbamol  (ROBAXIN ) 750 MG tablet TAKE 1 TABLET (750 MG TOTAL) BY MOUTH EVERY 8 (EIGHT) HOURS AS NEEDED FOR MUSCLE SPASMS. 90 tablet 1   metoprolol  tartrate (LOPRESSOR ) 100 MG tablet Take 1 tablet (100 mg total) by mouth 2 (two) times daily. (Patient taking differently: Take 75 mg by mouth 2 (two) times daily.) 180 tablet 3   mometasone -formoterol  (DULERA ) 200-5 MCG/ACT AERO Inhale 2 puffs into the lungs in the morning and at bedtime. 1 each 5   ondansetron  (ZOFRAN ) 8 MG tablet Take 1 tablet (8 mg total) by mouth every 8 (eight) hours as needed for nausea or vomiting. 30 tablet 2   Tofacitinib Citrate ER (XELJANZ XR) 11 MG TB24 Take 11 mg by mouth daily.     Ubrogepant  (UBRELVY ) 100 MG TABS Take 1 tablet (100 mg total) by mouth daily. 30 tablet 2   fluticasone  (FLONASE ) 50 MCG/ACT nasal spray Place 2 sprays into both nostrils in the morning and at bedtime. 16 g 11   No current facility-administered medications on file prior to visit.     Review of Systems:  As per HPI- otherwise negative.   Physical Examination: Vitals:   04/04/23 1314  BP: 126/80  Pulse: 74  Resp: 18  Temp: 98.6 F (37 C)  SpO2: 99%   Vitals:   04/04/23 1314  Weight: (!) 334 lb (151.5 kg)  Height:  5' 7 (1.702 m)   Body mass index is 52.31 kg/m. Ideal Body Weight: Weight in (lb) to have BMI = 25: 159.3  GEN: no acute distress.  Obese, looks well.  Her hair is growing back HEENT: Atraumatic, Normocephalic.  Ears and Nose: No external deformity. CV: RRR, No M/G/R. No JVD. No thrill. No extra heart sounds. PULM: CTA B, no wheezes, crackles, rhonchi. No retractions. No resp. distress. No accessory muscle use. ABD: S, NT, ND, +BS. No rebound. No HSM. EXTR: No c/c/e PSYCH: Normally interactive. Conversant.   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  Blood pressure and headaches are improved on metoprolol  75 twice daily, refilled this for her today Follow-up on history of vitamin D  and B12 deficiency Patient is on chronic steroids, A1c pending to rule out diabetes Will have her start on Wegovy  0.  2 5 weekly for weight control.  I gave her a month of samples.  Following 1 month of 0.25 we can increase to 0.5.  Discussed how to use this medication, most common side effects and how to  titrate  Signed Harlene Schroeder, MD  Received labs as below, message to patient  Results for orders placed or performed in visit on 04/04/23  Hemoglobin A1c   Collection Time: 04/04/23  1:48 PM  Result Value Ref Range   Hgb A1c MFr Bld 5.3 4.6 - 6.5 %  VITAMIN D  25 Hydroxy (Vit-D Deficiency, Fractures)   Collection Time: 04/04/23  1:48 PM  Result Value Ref Range   VITD 59.55 30.00 - 100.00 ng/mL  Vitamin B12   Collection Time: 04/04/23  1:48 PM  Result Value Ref Range   Vitamin B-12 313 211 - 911 pg/mL

## 2023-04-04 ENCOUNTER — Encounter: Payer: Self-pay | Admitting: Family Medicine

## 2023-04-04 ENCOUNTER — Ambulatory Visit: Payer: BC Managed Care – PPO | Admitting: Family Medicine

## 2023-04-04 VITALS — BP 126/80 | HR 74 | Temp 98.6°F | Resp 18 | Ht 67.0 in | Wt 334.0 lb

## 2023-04-04 DIAGNOSIS — E242 Drug-induced Cushing's syndrome: Secondary | ICD-10-CM

## 2023-04-04 DIAGNOSIS — I10 Essential (primary) hypertension: Secondary | ICD-10-CM | POA: Diagnosis not present

## 2023-04-04 DIAGNOSIS — E559 Vitamin D deficiency, unspecified: Secondary | ICD-10-CM

## 2023-04-04 DIAGNOSIS — Z131 Encounter for screening for diabetes mellitus: Secondary | ICD-10-CM

## 2023-04-04 DIAGNOSIS — M3130 Wegener's granulomatosis without renal involvement: Secondary | ICD-10-CM | POA: Diagnosis not present

## 2023-04-04 DIAGNOSIS — G43709 Chronic migraine without aura, not intractable, without status migrainosus: Secondary | ICD-10-CM

## 2023-04-04 DIAGNOSIS — E2749 Other adrenocortical insufficiency: Secondary | ICD-10-CM

## 2023-04-04 DIAGNOSIS — Z7985 Long-term (current) use of injectable non-insulin antidiabetic drugs: Secondary | ICD-10-CM

## 2023-04-04 DIAGNOSIS — E538 Deficiency of other specified B group vitamins: Secondary | ICD-10-CM

## 2023-04-04 DIAGNOSIS — M0520 Rheumatoid vasculitis with rheumatoid arthritis of unspecified site: Secondary | ICD-10-CM

## 2023-04-04 LAB — VITAMIN D 25 HYDROXY (VIT D DEFICIENCY, FRACTURES): VITD: 59.55 ng/mL (ref 30.00–100.00)

## 2023-04-04 LAB — HEMOGLOBIN A1C: Hgb A1c MFr Bld: 5.3 % (ref 4.6–6.5)

## 2023-04-04 LAB — VITAMIN B12: Vitamin B-12: 313 pg/mL (ref 211–911)

## 2023-04-04 MED ORDER — WEGOVY 0.25 MG/0.5ML ~~LOC~~ SOAJ
0.2500 mg | SUBCUTANEOUS | Status: DC
Start: 2023-04-04 — End: 2023-09-14

## 2023-04-04 MED ORDER — METOPROLOL TARTRATE 50 MG PO TABS
75.0000 mg | ORAL_TABLET | Freq: Two times a day (BID) | ORAL | 3 refills | Status: DC
Start: 2023-04-04 — End: 2024-02-18

## 2023-04-04 MED ORDER — WEGOVY 0.5 MG/0.5ML ~~LOC~~ SOAJ
0.5000 mg | SUBCUTANEOUS | 1 refills | Status: DC
Start: 2023-04-04 — End: 2023-09-14

## 2023-04-04 NOTE — Patient Instructions (Signed)
 It was good to see you again today as always  I will be in touch with your labs  I sent in a prescription for metoprolol  50 mg, take 1-1/2 tablets twice a day to equal 75 mg.  If this does not work out properly please let me know  Lets have you start on Wegovy  0.25 mg once weekly.  We can go up to the next higher dose, 0.5 mg in 1 month assuming you are tolerating the 0.25 well.  I will send in a prescription for the 0.5 strength-let me know what happens when he tried to fill this.  If your insurance will not cover it you may do better getting a compounded generic drug through an online service

## 2023-04-09 DIAGNOSIS — R7989 Other specified abnormal findings of blood chemistry: Secondary | ICD-10-CM | POA: Diagnosis not present

## 2023-04-12 ENCOUNTER — Ambulatory Visit: Payer: BC Managed Care – PPO | Admitting: Neurology

## 2023-04-12 DIAGNOSIS — G4733 Obstructive sleep apnea (adult) (pediatric): Secondary | ICD-10-CM

## 2023-04-12 DIAGNOSIS — Z820 Family history of epilepsy and other diseases of the nervous system: Secondary | ICD-10-CM

## 2023-04-12 DIAGNOSIS — R519 Headache, unspecified: Secondary | ICD-10-CM

## 2023-04-12 DIAGNOSIS — R0683 Snoring: Secondary | ICD-10-CM

## 2023-04-25 NOTE — Progress Notes (Unsigned)
Diane Wells

## 2023-04-26 ENCOUNTER — Encounter: Payer: Self-pay | Admitting: Family Medicine

## 2023-04-26 ENCOUNTER — Other Ambulatory Visit: Payer: Self-pay | Admitting: Family Medicine

## 2023-04-26 DIAGNOSIS — G4733 Obstructive sleep apnea (adult) (pediatric): Secondary | ICD-10-CM

## 2023-04-26 NOTE — Procedures (Signed)
[facility]  HOME SLEEP TEST (SANSA) REPORT (Mail-Out Device):   STUDY DATE: 04/13/23  DOB: 1980/10/23  MRN: [id number]  ORDERING CLINICIAN: [name], MD, PhD   REFERRING CLINICIAN: Copland, Gwenlyn Found, MD   CLINICAL INFORMATION/HISTORY: 43 year old female with an underlying medical history of chronic kidney disease with history of glomerulonephritis, autoimmune vasculitis, chronic neck pain with status post cervical spine surgery, low back pain, asthma, rheumatoid arthritis,and severe obesity with a BMI of over 50, who reports recurrent headaches for the past several years. She does not sleep very well.  Sleep is interrupted and not restful.    PATIENT'S LAST REPORTED EPWORTH SLEEPINESS SCORE (ESS): 13/24.  BMI (at the time of sleep clinic visit and/or test date): 52.3 kg/m  FINDINGS:   Study Protocol:    The SANSA single-point-of-skin-contact chest-worn sensor - an FDA and DOT approved type 4 home sleep test device - measures eight physiological channels,  including blood oxygen saturation (measured via PPG [photoplethysmography]), EKG-derived heart rate, respiratory effort, chest movement (measured via accelerometer), snoring, body position, and actigraphy. The device is designed to be worn for up to 10 hours per study.   Sleep Summary:   Total Recording Time (hours, min): 9 hours, 57 min  Total Effective Sleep Time (hours, min):  8 hours, 5 min  Sleep Efficiency (%):    81%   Respiratory Indices:   Calculated sAHI (per hour):  22.8/hour         Oxygen Saturation Statistics:    Oxygen Saturation (%) Mean: 94.2%   Minimum oxygen saturation (%):                 73.4%   O2 Saturation Range (%): 73.4 - 100%    Pulse Rate Statistics:   Pulse Mean (bpm):    70/min    Pulse Range (59 - 91/min)   Snoring: mild to moderate   IMPRESSION/DIAGNOSES:   OSA (obstructive sleep apnea), moderate    RECOMMENDATIONS:   This home sleep test  demonstrates moderate obstructive sleep apnea with a total AHI of 22.8/hour and O2 nadir of 73.4%. Mild to moderate snoring was detected. Treatment with a positive airway pressure (PAP) device is recommended. The patient will be advised to proceed with an autoPAP titration/trial at home for now. I recommend autoPAP of 6-12 cm with EPR of 2, mask of choice, sized to fit. A full night titration study may be considered to optimize treatment settings, monitor proper oxygen saturations and aid with improvement of tolerance and adherence, if needed down the road. Alternative treatment options may include a dental device through dentistry or orthodontics in selected patients or Inspire (hypoglossal nerve stimulator) in carefully selected patients (meeting inclusion criteria).  Concomitant weight loss is recommended (where clinically appropriate). Please note that untreated obstructive sleep apnea may carry additional perioperative morbidity. Patients with significant obstructive sleep apnea should receive perioperative PAP therapy and the surgeons and particularly the anesthesiologist should be informed of the diagnosis and the severity of the sleep disordered breathing. The patient should be cautioned not to drive, work at heights, or operate dangerous or heavy equipment when tired or sleepy. Review and reiteration of good sleep hygiene measures should be pursued with any patient. Other causes of the patient's symptoms, including circadian rhythm disturbances, an underlying mood disorder, medication effect and/or an underlying medical problem cannot be ruled out based on this test. Clinical correlation is recommended.  The patient and her referring provider will be notified of the test results.  The patient will be seen in follow up in sleep clinic at [facility].  I certify that I have reviewed the raw data recording prior to the issuance of this report in accordance with the standards of the American Academy of Sleep  Medicine (AASM).    INTERPRETING PHYSICIAN:   [name], MD, PhD Medical Director, [facility]) Diplomat, ABPN (Neurology and Sleep)   [facility] [address] [place], [place] [address] [phone]

## 2023-05-09 DIAGNOSIS — E041 Nontoxic single thyroid nodule: Secondary | ICD-10-CM | POA: Diagnosis not present

## 2023-05-09 DIAGNOSIS — E042 Nontoxic multinodular goiter: Secondary | ICD-10-CM | POA: Diagnosis not present

## 2023-05-09 DIAGNOSIS — E2749 Other adrenocortical insufficiency: Secondary | ICD-10-CM | POA: Diagnosis not present

## 2023-05-09 LAB — HEPATIC FUNCTION PANEL
ALT: 12 U/L (ref 7–35)
AST: 14 (ref 13–35)
Bilirubin, Total: 0.4

## 2023-05-09 LAB — COMPREHENSIVE METABOLIC PANEL WITH GFR
Calcium: 8.9 (ref 8.7–10.7)
eGFR: 71

## 2023-05-09 LAB — BASIC METABOLIC PANEL WITH GFR
BUN: 11 (ref 4–21)
CO2: 29 — AB (ref 13–22)
Chloride: 101 (ref 99–108)
Creatinine: 1 (ref 0.5–1.1)
Glucose: 97
Potassium: 4.1 meq/L (ref 3.5–5.1)
Sodium: 138 (ref 137–147)

## 2023-05-09 LAB — TSH: TSH: 1.8 (ref 0.41–5.90)

## 2023-05-18 DIAGNOSIS — I10 Essential (primary) hypertension: Secondary | ICD-10-CM | POA: Diagnosis not present

## 2023-05-18 DIAGNOSIS — E242 Drug-induced Cushing's syndrome: Secondary | ICD-10-CM | POA: Diagnosis not present

## 2023-05-18 DIAGNOSIS — E2749 Other adrenocortical insufficiency: Secondary | ICD-10-CM | POA: Diagnosis not present

## 2023-05-18 DIAGNOSIS — E041 Nontoxic single thyroid nodule: Secondary | ICD-10-CM | POA: Diagnosis not present

## 2023-05-24 DIAGNOSIS — G4733 Obstructive sleep apnea (adult) (pediatric): Secondary | ICD-10-CM | POA: Diagnosis not present

## 2023-05-25 ENCOUNTER — Other Ambulatory Visit: Payer: Self-pay | Admitting: Family Medicine

## 2023-05-25 DIAGNOSIS — M5090 Cervical disc disorder, unspecified, unspecified cervical region: Secondary | ICD-10-CM

## 2023-05-25 DIAGNOSIS — G5691 Unspecified mononeuropathy of right upper limb: Secondary | ICD-10-CM

## 2023-05-25 NOTE — Telephone Encounter (Signed)
 Requesting: GABAPENTIN 300 MG CAPSULE  Last Visit: 04/04/2023 Next Visit: Visit date not found Last Refill: 01/01/2023  Please Advise

## 2023-05-30 ENCOUNTER — Other Ambulatory Visit (HOSPITAL_COMMUNITY): Payer: Self-pay

## 2023-05-30 ENCOUNTER — Other Ambulatory Visit: Payer: Self-pay

## 2023-05-30 ENCOUNTER — Telehealth: Payer: Self-pay

## 2023-05-30 NOTE — Telephone Encounter (Signed)
 Pharmacy Patient Advocate Encounter   Received notification from CoverMyMeds that prior authorization for Ubrelvy 100MG  tablets is required/requested.     Per test claim: PA required; PA submitted to above mentioned insurance via CoverMyMeds Key/confirmation #/EOC [id number] Status is pending    PLEASE BE ADVISED BEEN SENT TO PLAN

## 2023-06-01 ENCOUNTER — Other Ambulatory Visit (HOSPITAL_COMMUNITY): Payer: Self-pay

## 2023-06-01 NOTE — Telephone Encounter (Signed)
 Prior Authorization form/request asks a question that requires your assistance. Please see the question below and advise accordingly. The PA will not be submitted until the necessary information is received.

## 2023-06-12 ENCOUNTER — Encounter: Payer: Self-pay | Admitting: Family Medicine

## 2023-06-13 NOTE — Telephone Encounter (Signed)
 I reached out to pt to ask her how Bernita Raisin is working for her:   It's working well (not as great as the [employer]	[place] trial but insurance had declined that). Since my BP is better controlled my migraines have decreased a little so I'm only taking the [place] as needed now.    Sometimes I will need to take 2 to get the migraine under control which is the max dose per 24 hours. So for now on avg of 4 tablets per week? 16 tablets per month should be plenty.

## 2023-06-14 NOTE — Telephone Encounter (Signed)
Additional information has been requested from the patient's insurance in order to proceed with the prior authorization request. Requested information has been sent, or form has been filled out and faxed back to [phone]

## 2023-06-18 ENCOUNTER — Encounter: Payer: Self-pay | Admitting: Internal Medicine

## 2023-06-20 DIAGNOSIS — M0579 Rheumatoid arthritis with rheumatoid factor of multiple sites without organ or systems involvement: Secondary | ICD-10-CM | POA: Diagnosis not present

## 2023-06-20 DIAGNOSIS — H15009 Unspecified scleritis, unspecified eye: Secondary | ICD-10-CM | POA: Diagnosis not present

## 2023-06-20 DIAGNOSIS — M3130 Wegener's granulomatosis without renal involvement: Secondary | ICD-10-CM | POA: Diagnosis not present

## 2023-06-20 DIAGNOSIS — L659 Nonscarring hair loss, unspecified: Secondary | ICD-10-CM | POA: Diagnosis not present

## 2023-06-20 NOTE — Telephone Encounter (Signed)
 Pharmacy Patient Advocate Encounter  Received notification from [facility] that Prior Authorization for UBRELVY has been APPROVED from 05/30/23 to 05/29/24   PA #/Case ID/Reference #: [id number]

## 2023-06-21 DIAGNOSIS — G4733 Obstructive sleep apnea (adult) (pediatric): Secondary | ICD-10-CM | POA: Diagnosis not present

## 2023-06-24 ENCOUNTER — Other Ambulatory Visit: Payer: Self-pay | Admitting: Family Medicine

## 2023-06-24 DIAGNOSIS — M5090 Cervical disc disorder, unspecified, unspecified cervical region: Secondary | ICD-10-CM

## 2023-06-24 DIAGNOSIS — G43709 Chronic migraine without aura, not intractable, without status migrainosus: Secondary | ICD-10-CM

## 2023-06-24 DIAGNOSIS — G5691 Unspecified mononeuropathy of right upper limb: Secondary | ICD-10-CM

## 2023-06-24 DIAGNOSIS — M62838 Other muscle spasm: Secondary | ICD-10-CM

## 2023-06-29 DIAGNOSIS — E242 Drug-induced Cushing's syndrome: Secondary | ICD-10-CM | POA: Diagnosis not present

## 2023-06-29 DIAGNOSIS — E2749 Other adrenocortical insufficiency: Secondary | ICD-10-CM | POA: Diagnosis not present

## 2023-06-29 DIAGNOSIS — E041 Nontoxic single thyroid nodule: Secondary | ICD-10-CM | POA: Diagnosis not present

## 2023-07-22 DIAGNOSIS — G4733 Obstructive sleep apnea (adult) (pediatric): Secondary | ICD-10-CM | POA: Diagnosis not present

## 2023-08-21 DIAGNOSIS — G4733 Obstructive sleep apnea (adult) (pediatric): Secondary | ICD-10-CM | POA: Diagnosis not present

## 2023-08-26 ENCOUNTER — Other Ambulatory Visit: Payer: Self-pay | Admitting: Family Medicine

## 2023-08-26 DIAGNOSIS — G5691 Unspecified mononeuropathy of right upper limb: Secondary | ICD-10-CM

## 2023-08-26 DIAGNOSIS — M5090 Cervical disc disorder, unspecified, unspecified cervical region: Secondary | ICD-10-CM

## 2023-09-14 ENCOUNTER — Ambulatory Visit: Admitting: Internal Medicine

## 2023-09-14 ENCOUNTER — Encounter: Payer: Self-pay | Admitting: Internal Medicine

## 2023-09-14 VITALS — BP 120/86 | HR 91 | Ht 67.0 in | Wt 353.6 lb

## 2023-09-14 DIAGNOSIS — J310 Chronic rhinitis: Secondary | ICD-10-CM

## 2023-09-14 DIAGNOSIS — M3131 Wegener's granulomatosis with renal involvement: Secondary | ICD-10-CM

## 2023-09-14 DIAGNOSIS — R0602 Shortness of breath: Secondary | ICD-10-CM | POA: Diagnosis not present

## 2023-09-14 DIAGNOSIS — J4550 Severe persistent asthma, uncomplicated: Secondary | ICD-10-CM | POA: Diagnosis not present

## 2023-09-14 MED ORDER — AIRSUPRA 90-80 MCG/ACT IN AERO: 2.0000 | INHALATION_SPRAY | Freq: Four times a day (QID) | RESPIRATORY_TRACT | 11 refills | Status: DC | PRN

## 2023-09-14 MED ORDER — IPRATROPIUM BROMIDE 0.03 % NA SOLN: 2.0000 | Freq: Two times a day (BID) | NASAL | 12 refills | Status: DC

## 2023-09-14 NOTE — Patient Instructions (Signed)
 It was a pleasure to see you today!  Please schedule follow up with myself in 1 year.  If my schedule is not open yet, we will contact you with a reminder closer to that time. Please call [phone] if you haven't heard from us  a month before, and always call us  sooner if issues or concerns arise. You can also send us  a message through MyChart, but but aware that this is not to be used for urgent issues and it may take up to 5-7 days to receive a reply. Please be aware that you will likely be able to view your results before I have a chance to respond to them. Please give us  5 business days to respond to any non-urgent results.    Before your next visit I would like you to have: Spirometry - 30 minutes, schedule at front desk  Continue Dulera  2 puffs BID.  Will repeat spirometry with bronchodilator  Will change albuterol  as needed to airsupra which is albuterol -budesonide . Up to 4 times a day as needed for shortness of breath, chest tightness, wheezing.   CT sinuses without any GPA involvement.  Continue fluticasone  with intranasal saline as you are doing.  Will add ipratropium nasal spray up to 4 times a day as needed for nasal drainage.

## 2023-09-14 NOTE — Progress Notes (Signed)
 Diane Wells    [id number]    Oct 04, 1980  Primary Care Physician:Copland, Diane Dumas, MD Date of Appointment: 09/14/2023 Established Patient Visit  Chief complaint:   Chief Complaint  Patient presents with   Follow-up    Asthma symptoms are stable but she still get out of breath.    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  I have detailed her asthma history below.  Her more recent history begins in the last year when she developed arthralgias and neuropathy as well as recurrent sinus issues in early 2020.    GPA history: Dr. Meredith Wells at [facility] Dr. Omar Bibber in Neurology Dr. 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 Higinio Love. Biopsy deferred due to presumptive diagnosis ultrasound findings, and proteinuria and hematuria.  Interval Updates: Here for annual follow up. Has been dealing with alopecia last year lost almost all of her hair. Now starting to grow back. She is now on [place]. No longer on cell cept. Still on plaquenil  200 mg daily for RA. Current hydrocortisone  10 mg daily in the morning unable to tolerate the reduction in dose due to nausea, vomiting.   Labs reviewed - CMET stable.  Breathing been doing ok at rest. Breathing worsens with heat and humidity especially with exertion.  Has gained weight over the past year which may be playing a role.   Had some sinus issues and chronic drainage which triggers use of albuterol .  Uses flonase  religiously. Using nasal saline almost daily.  Hasn't needed antibiotics in the last year.   Current Regimen: on dulera  230 2 puffs  BID. Albuterol  MDI (allergic to nebulizer solution)using 1-2 times/week Asthma Triggers:seasonal and environmental allergies, heat, humidty.  Exacerbations in the last year: 0 for asthma in 2024 History of hospitalization or intubation: none, ED visit in Feb 2021 Hives:  anaphylaxis  Allergy Testing: Had SPT testing 2018 years ago: grass, tree, mold, feather, cat dust mites. Wasn't able to keep up with SCIT in the past due to schedules  GERD: denies Allergic Rhinitis: yes ACT:  Asthma Control Test ACT Total Score  09/14/2023 11:03 AM 18  05/29/2022 11:03 AM 14  05/05/2021  3:35 PM 15   FeNO: 24 ppb while on high dose prednisone  previously - 16 ppb on 05/29/22.   I have reviewed the patient's family social and past medical history and updated as appropriate.   Past Medical History:  Diagnosis Date   Adrenal insufficiency (HCC)    Allergic rhinitis    Asthma    Astigmatism    Chronic kidney disease    Cushing's disease (HCC)    idiopathic   Granulomatosis with polyangiitis (HCC)    Hypertension    Neutropenia (HCC)    hospitalized in early 2023 for 9 days   Recurrent upper respiratory infection (URI)     Past Surgical History:  Procedure Laterality Date   ADENOIDECTOMY     ANTERIOR CERVICAL DECOMP/DISCECTOMY FUSION      Family History  Problem Relation Age of Onset   Diabetes Mother        Type 1 Diabetes, juvenile onset   Hypertension Mother    Pulmonary Hypertension Mother    Hyperthyroidism Mother    Glaucoma Mother    Neuropathy Mother    Sjogren's syndrome Mother    Carpal tunnel syndrome Mother    Hypertension Father    Gallbladder disease Father  Breast cancer Sister 69       metastic IBC   Diabetes Maternal Uncle    Heart attack Maternal Uncle    Stroke Maternal Uncle    Hypertension Maternal Grandmother    Glaucoma Maternal Grandmother    Coronary artery disease Maternal Grandmother    Heart attack Maternal Grandmother    Angina Maternal Grandmother    Macular degeneration Maternal Grandmother    COPD Maternal Grandmother    Congestive Heart Failure Maternal Grandmother    Glaucoma Maternal Grandfather    Leukemia Paternal Grandmother    Hypertension Paternal Grandmother    Colon cancer Paternal Grandfather     Allergic rhinitis Neg Hx    Angioedema Neg Hx    Asthma Neg Hx    Eczema Neg Hx    Pseudotumor cerebri Neg Hx    Migraines Neg Hx     Social History   Occupational History   Not on file  Tobacco Use   Smoking status: Never   Smokeless tobacco: Never  Vaping Use   Vaping status: Never Used  Substance and Sexual Activity   Alcohol use: Not Currently    Comment: was rare   Drug use: No   Sexual activity: Not Currently    Birth control/protection: None     Physical Exam: Blood pressure 120/86, pulse 91, height 5' 7 (1.702 m), weight (!) 353 lb 9.6 oz (160.4 kg), SpO2 98%.  Gen:      No acute distress, obese Lungs:   diminished, no wheezes or crackles, no increased work of breathing CV:         diminished,tachycardic, regular MSK:     No rashes Ext: non pitting edema  Data Reviewed: Imaging: I have personally reviewed the chest xray July 2021 no acute cardiopulmonary process  CT sinus without evidence of sinusitis or bony destruction.   PFTs:     Latest Ref Rng & Units 06/16/2020   10:55 AM 06/03/2019    2:57 PM  PFT Results  FVC-Pre L 3.38  3.87   FVC-Predicted Pre % 85  97   FVC-Post L 2.92  3.42   FVC-Predicted Post % 73  86   Pre FEV1/FVC % % 86  84   Post FEV1/FCV % % 91  88   FEV1-Pre L 2.92  3.27   FEV1-Predicted Pre % 90  100   FEV1-Post L 2.65  3.00   DLCO uncorrected ml/min/mmHg  18.88   DLCO UNC% %  80   DLCO corrected ml/min/mmHg  20.57   DLCO COR %Predicted %  87   DLVA Predicted %  95   TLC L  5.28   TLC % Predicted %  98   RV % Predicted %  86    I have personally reviewed the patient's PFTs and they are without airflow limitation, have normal lung volumes, and normal diffusion capacity. No significant BD response. Feno is 24ppb which is not elevated in march 2022.   Labs: Labs reviewed June 22 2023 - Cr 1.02, CMET otherwise normal.    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  Severe Persistent Allergic Asthma Allergic Rhinitis Nasal congestion with recurrent sinus infections  Plan/Recommendations: Continue Dulera  2 puffs BID.  Will repeat spirometry with bronchodilator  Will change albuterol  as needed to airsupra which is albuterol -budesonide . Up to 4 times a day as needed for shortness of breath, chest tightness, wheezing.   Continue fluticasone  with intranasal saline as you are doing.   CT sinuses without any GPA involvement.  Will add ipratropium nasal spray up to 4 times a day as needed for nasal drainage.   Return to Care: Return in about 1 year (around 09/13/2024).   Louie Rover, MD Pulmonary and Critical Care Medicine [facility] Office:[phone]

## 2023-09-19 DIAGNOSIS — H15009 Unspecified scleritis, unspecified eye: Secondary | ICD-10-CM | POA: Diagnosis not present

## 2023-09-19 DIAGNOSIS — M0579 Rheumatoid arthritis with rheumatoid factor of multiple sites without organ or systems involvement: Secondary | ICD-10-CM | POA: Diagnosis not present

## 2023-09-19 DIAGNOSIS — M3130 Wegener's granulomatosis without renal involvement: Secondary | ICD-10-CM | POA: Diagnosis not present

## 2023-09-19 DIAGNOSIS — R809 Proteinuria, unspecified: Secondary | ICD-10-CM | POA: Diagnosis not present

## 2023-09-21 DIAGNOSIS — G4733 Obstructive sleep apnea (adult) (pediatric): Secondary | ICD-10-CM | POA: Diagnosis not present

## 2023-09-27 NOTE — Progress Notes (Deleted)
 SABRA

## 2023-09-30 NOTE — Progress Notes (Deleted)
 No chief complaint on file.   HISTORY OF PRESENT ILLNESS:  09/30/23 ALL:  Diane Wells returns for follow up for headaches and newly diagnosed OSA started on CPAP. She was last seen 02/2023 and had recently been started on metoprolol  for HTN and we repeated HST. HST 03/2023 showed moderate OSA with total AHI 23/hr and O2 nadir 73%. AutoPAP orders placed. Since,     03/15/2023 ALL:  Diane Wells is a 43 y.o. female here today for follow up for recurrent headaches. She was seen in consult with Dr Buck 01/2022. Prior MRI brian 11/2021 showed narrowing of distal transverse sinus with mild optic nerve tortuosity. Cervical MRI showed stable ACDF C6-C7 and mild degenerative changes without canal stenosis or foraminal narrowing. LP showed OP 23cmH20. She was advised to take topiramate  50mg  at bedtime to help with compliance and recommended she consider ER dosing if needed. HST 03/2022 showed AHI borderline at 5.3/hr. No CPAP advised. She reported GI side effects with taking topiramate  50mg  daily and had reduced dose to 25mg . She was scheduled for follow up with Harlene Bogaert 03/2022 but cancelled. She presented to UC yesterday with headache. She reported being started on metoprolol  12/17 for HTN. BP had been 182/120 and 200/126 in endocrinology office. Endocrinologist started her on metoprolol  25mg  BID. CT normal. BP 154/91 in ER. She was given meloxicam .   Since, she reports continued headaches. BP remains elevated. She is taking metoprolol  BID and seems to tolerate it well. She is planning to call PCP, today, to discuss increased dose. She describes headache as a tension/tightness in the base of the neck that radiates to the top of her head. Usually has left sided pain, some pain behind left eye. Occasionally on right. She does have some light and sound sensitivity and nausea with sever headaches. She was unable to tolerate topiramate . Nurtec (every other day) worked well for her in the past but was not  covered by insurance. Last eye exam normal 12/2022. No edema noted.   Tried and failed: topiramate , metoprolol , sumatriptan , butalbital   HISTORY (copied from Dr Obie previous note)  Dear Dr. Watt,   I saw your patient, Diane Wells, upon your kind request in my neurologic clinic today for initial consultation of her recurrent headaches.  The patient is unaccompanied today.  As you know, Diane Wells is a 43 year old female with an underlying medical history of chronic kidney disease with history of glomerulonephritis, autoimmune vasculitis, chronic neck pain with status post cervical spine surgery, low back pain, asthma, rheumatoid arthritis,and severe obesity with a BMI of over 40, who reports recurrent headaches for the past several years, in the past 6 months her headaches have become worse and are primarily left-sided, even as frequent as 3-4 times a week at one point.  She has had associated nausea.  She has not had any strokelike symptoms thankfully.  She tries to hydrate well, avoids caffeine , limits herself to 1 serving per day on average.  She has tried Imitrex  on a few occasions but did not find it helpful, Fioricet  helps with severe tension headaches but not so much with the migrainous headaches she has had.  Imitrex  caused her to feel dizzy and groggy, unable to function.   Of note, she does not sleep very well.  Sleep is interrupted and not restful.  Her Epworth sleepiness score is 13 out of 24, fatigue severity score is 57 out of 63 today.  She has never had a sleep study but would be  willing to explore this.  She has a family history of sleep apnea affecting both parents.  She has a history of snoring.   I reviewed your office note from 11/17/2021.  She was recently started on Topamax  for headaches.  She reported improvement with her Topamax .  She is currently taking 25 mg twice daily.  She has had some side effects including diarrhea, and nausea and cognitive slowing.  Her cognitive  side effects improved and nausea improved but she still has intermittent diarrhea.   She had a brain MRI with and without contrast on 12/01/2021 and I reviewed the results: Impression:1. Narrowing of the distal transverse sinuses near the transverse-sigmoid junction, with mild optic nerve tortuosity. These findings are nonspecific but can be seen in the setting of idiopathic intracranial hypertension, a cause of headaches. Correlate with symptoms and consider lumbar puncture with opening pressure if clinically indicated. 2. No additional acute intracranial process.   She had a cervical spine MRI without contrast on 12/01/2021 and I reviewed the results: Impression: 1. C6-C7 ACDF, without spinal canal stenosis or neural foraminal narrowing. 2. Mild degenerative changes in the cervical spine without spinal canal stenosis or neural foraminal narrowing.   She had a fluoroscopic guided lumbar puncture on 12/08/2021 and I reviewed the results: Impression: 1. Technically successful fluoroscopically guided lumbar puncture. 2. Mildly elevated opening CSF pressure of 23 cm water.     Her opening pressure was 23 cm, 8.5 mL of CSF were removed, closing pressure was 17 cm.   She has seen an ophthalmologist.  Her eye examination was reportedly normal.  She has seen [facility] El[facility] Ar[facility] and follows with them on a regular basis, she reports a full eye examination with no evidence of eye nerve swelling and good visual fields.  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  She takes Robaxin  twice daily, she also takes gabapentin , 300 in the morning and 600 at bedtime.   She is also followed by oncology, rheumatology and pulmonology.   REVIEW OF SYSTEMS: Out of a complete 14 system review of symptoms, the patient complains only of the following symptoms, headaches, chronic neck pain, generalized pains, and all other reviewed systems are negative.   ALLERGIES: Allergies  Allergen  Reactions   Albuterol  Shortness Of Breath and Other (See Comments)    Tolerates HFA inhaler, nebulizer treatment caused O2 drop   Other Anaphylaxis    Essential Oil, scent unknown, caused tongue swelling   Truxima  [Rituximab ] Anaphylaxis   Sulfa  Antibiotics Hives   Vancomycin  Itching    Scalp itching - felt like it was on fire     HOME MEDICATIONS: Outpatient Medications Prior to Visit  Medication Sig Dispense Refill   albuterol  (VENTOLIN  HFA) 108 (90 Base) MCG/ACT inhaler INHALE 1-2 PUFFS INTO THE LUNGS DAILY AS NEEDED FOR SHORTNESS OF BREATH. DAW 18 each 2   Albuterol -Budesonide  (AIRSUPRA ) 90-80 MCG/ACT AERO Inhale 2 puffs into the lungs every 6 (six) hours as needed. 10.7 g 11   butalbital -acetaminophen -caffeine  (FIORICET ) 50-325-40 MG tablet TAKE 1 TABLET BY MOUTH EVERY 6 HOURS AS NEEDED FOR HEADACHE 60 tablet 0   cyclobenzaprine  (FLEXERIL ) 10 MG tablet Take 1 tablet (10 mg total) by mouth at bedtime. Use as needed for muscle spasm and headache 90 tablet 1   cyclobenzaprine  (FLEXERIL ) 5 MG tablet TAKE 1-2 AT BEDTIME AS NEEDED FOR HEADACHE AND MUSCLE SPASM 60 tablet 3   EPINEPHrine  0.3 mg/0.3 mL IJ SOAJ injection Inject 0.3  mg into the muscle once as needed for anaphylaxis.     fexofenadine (ALLEGRA) 180 MG tablet Take 180 mg by mouth every morning.     fluticasone  (FLONASE ) 50 MCG/ACT nasal spray Place 2 sprays into both nostrils in the morning and at bedtime. 16 g 11   furosemide  (LASIX ) 20 MG tablet TAKE 1 (ONE) TABLET DAILY AS NEEDED (Patient taking differently: Take 20-40 mg by mouth daily as needed (swelling).) 90 tablet 0   gabapentin  (NEURONTIN ) 300 MG capsule TAKE 1-2 CAPSULES (300-600 MG TOTAL) BY MOUTH 3 (THREE) TIMES DAILY AS NEEDED. 270 capsule 1   hydrocortisone  (CORTEF ) 10 MG tablet Take 10 mg by mouth every morning.     hydroxychloroquine  (PLAQUENIL ) 200 MG tablet Take 1 tablet (200 mg total) by mouth 2 (two) times daily. Hold until ok with your rheumatologist      ipratropium (ATROVENT ) 0.03 % nasal spray Place 2 sprays into both nostrils every 12 (twelve) hours. 30 mL 12   ketorolac  (ACULAR ) 0.5 % ophthalmic solution 1 drop See admin instructions. Apply one drop into left eye daily at bedtime, may also use up to three more times daily in affected eye(s) as needed for scleritis     losartan  (COZAAR ) 50 MG tablet Take 1.5 tablets (75 mg total) by mouth daily. 135 tablet 3   meloxicam  (MOBIC ) 15 MG tablet Take 1 tablet (15 mg total) by mouth daily. 30 tablet 2   methocarbamol  (ROBAXIN ) 750 MG tablet TAKE 1 TABLET (750 MG TOTAL) BY MOUTH EVERY 8 (EIGHT) HOURS AS NEEDED FOR MUSCLE SPASMS. 90 tablet 1   metoprolol  tartrate (LOPRESSOR ) 50 MG tablet Take 1.5 tablets (75 mg total) by mouth 2 (two) times daily. 270 tablet 3   mometasone -formoterol  (DULERA ) 200-5 MCG/ACT AERO Inhale 2 puffs into the lungs in the morning and at bedtime. 1 each 5   ondansetron  (ZOFRAN ) 8 MG tablet Take 1 tablet (8 mg total) by mouth every 8 (eight) hours as needed for nausea or vomiting. 30 tablet 2   Tofacitinib Citrate ER (XELJANZ XR) 11 MG TB24 Take 11 mg by mouth daily.     Ubrogepant  (UBRELVY ) 100 MG TABS Take 1 tablet (100 mg total) by mouth daily. 30 tablet 2   No facility-administered medications prior to visit.     PAST MEDICAL HISTORY: Past Medical History:  Diagnosis Date   Adrenal insufficiency (HCC)    Allergic rhinitis    Asthma    Astigmatism    Chronic kidney disease    Cushing's disease (HCC)    idiopathic   Granulomatosis with polyangiitis (HCC)    Hypertension    Neutropenia (HCC)    hospitalized in early 2023 for 9 days   Recurrent upper respiratory infection (URI)      PAST SURGICAL HISTORY: Past Surgical History:  Procedure Laterality Date   ADENOIDECTOMY     ANTERIOR CERVICAL DECOMP/DISCECTOMY FUSION       FAMILY HISTORY: Family History  Problem Relation Age of Onset   Diabetes Mother        Type 1 Diabetes, juvenile onset    Hypertension Mother    Pulmonary Hypertension Mother    Hyperthyroidism Mother    Glaucoma Mother    Neuropathy Mother    Sjogren's syndrome Mother    Carpal tunnel syndrome Mother    Hypertension Father    Gallbladder disease Father    Breast cancer Sister 29       metastic IBC   Diabetes Maternal Uncle  Heart attack Maternal Uncle    Stroke Maternal Uncle    Hypertension Maternal Grandmother    Glaucoma Maternal Grandmother    Coronary artery disease Maternal Grandmother    Heart attack Maternal Grandmother    Angina Maternal Grandmother    Macular degeneration Maternal Grandmother    COPD Maternal Grandmother    Congestive Heart Failure Maternal Grandmother    Glaucoma Maternal Grandfather    Leukemia Paternal Grandmother    Hypertension Paternal Grandmother    Colon cancer Paternal Grandfather    Allergic rhinitis Neg Hx    Angioedema Neg Hx    Asthma Neg Hx    Eczema Neg Hx    Pseudotumor cerebri Neg Hx    Migraines Neg Hx      SOCIAL HISTORY: Social History   Socioeconomic History   Marital status: Single    Spouse name: Not on file   Number of children: 0   Years of education: Not on file   Highest education level: Some college, no degree  Occupational History   Not on file  Tobacco Use   Smoking status: Never   Smokeless tobacco: Never  Vaping Use   Vaping status: Never Used  Substance and Sexual Activity   Alcohol use: Not Currently    Comment: was rare   Drug use: No   Sexual activity: Not Currently    Birth control/protection: None  Other Topics Concern   Not on file  Social History Narrative   Lives with her parents to help take care of them    Right handed   Caffeine : small cup of coffee once every other month and then caffeine  in Fioricet     Social Drivers of Health   Financial Resource Strain: Low Risk  (03/15/2023)   Overall Financial Resource Strain (CARDIA)    Difficulty of Paying Living Expenses: Not very hard  Food Insecurity:  No Food Insecurity (03/15/2023)   Hunger Vital Sign    Worried About Running Out of Food in the Last Year: Never true    Ran Out of Food in the Last Year: Never true  Transportation Needs: No Transportation Needs (03/15/2023)   PRAPARE - Administrator, Civil Service (Medical): No    Lack of Transportation (Non-Medical): No  Physical Activity: Unknown (03/15/2023)   Exercise Vital Sign    Days of Exercise per Week: 0 days    Minutes of Exercise per Session: Not on file  Stress: No Stress Concern Present (03/15/2023)   [employer] of Occupational Health - Occupational Stress Questionnaire    Feeling of Stress : Only a little  Social Connections: Moderately Integrated (03/15/2023)   Social Connection and Isolation Panel    Frequency of Communication with Friends and Family: More than three times a week    Frequency of Social Gatherings with Friends and Family: More than three times a week    Attends Religious Services: 1 to 4 times per year    Active Member of [employer] or Organizations: Yes    Attends Banker Meetings: 1 to 4 times per year    Marital Status: Never married  Intimate Partner Violence: Not on file     PHYSICAL EXAM  There were no vitals filed for this visit.  There is no height or weight on file to calculate BMI.  Generalized: Well developed, in no acute distress  Cardiology: normal rate and rhythm, no murmur auscultated  Respiratory: clear to auscultation bilaterally    Neurological examination  Mentation: Alert oriented to time, place, history taking. Follows all commands speech and language fluent Cranial nerve II-XII: Pupils were equal round reactive to light. Extraocular movements were full, visual field were full on confrontational test. Facial sensation and strength were normal. Uvula tongue midline. Head turning and shoulder shrug  were normal and symmetric. Motor: The motor testing reveals 5 over 5 strength of all 4  extremities. Good symmetric motor tone is noted throughout.  Sensory: Sensory testing is intact to soft touch on all 4 extremities. No evidence of extinction is noted.  Coordination: Cerebellar testing reveals good finger-nose-finger and heel-to-shin bilaterally.  Gait and station: Gait is normal.    DIAGNOSTIC DATA (LABS, IMAGING, TESTING) - I reviewed patient records, labs, notes, testing and imaging myself where available.  Lab Results  Component Value Date   WBC 22.5 (H) 06/15/2021   HGB 12.0 06/15/2021   HCT 37.5 06/15/2021   MCV 84.3 06/15/2021   PLT 101 (L) 06/15/2021      Component Value Date/Time   NA 136 06/15/2021 1504   K 4.4 06/15/2021 1504   CL 102 06/15/2021 1504   CO2 25 06/15/2021 1504   GLUCOSE 89 06/15/2021 1504   BUN 14 06/15/2021 1504   CREATININE 0.99 06/15/2021 1504   CALCIUM 8.2 (L) 06/15/2021 1504   PROT 6.7 06/15/2021 1504   ALBUMIN 3.3 (L) 06/15/2021 1504   AST 28 06/15/2021 1504   ALT 26 06/15/2021 1504   ALKPHOS 69 06/15/2021 1504   BILITOT 0.2 (L) 06/15/2021 1504   GFRNONAA >60 06/15/2021 1504   GFRAA >60 05/23/2019 2010   Lab Results  Component Value Date   CHOL 177 05/16/2018   HDL 52.00 05/16/2018   LDLCALC 108 (H) 05/16/2018   TRIG 83.0 05/16/2018   CHOLHDL 3 05/16/2018   Lab Results  Component Value Date   HGBA1C 5.3 04/04/2023   Lab Results  Component Value Date   VITAMINB12 313 04/04/2023   Lab Results  Component Value Date   TSH 1.60 10/20/2020        No data to display               No data to display           ASSESSMENT AND PLAN  43 y.o. year old female  has a past medical history of Adrenal insufficiency (HCC), Allergic rhinitis, Asthma, Astigmatism, Chronic kidney disease, Cushing's disease (HCC), Granulomatosis with polyangiitis (HCC), Hypertension, Neutropenia (HCC), and Recurrent upper respiratory infection (URI). here with    No diagnosis found.  PAISLEE SZATKOWSKI has had worsening headaches  for several months. BP has been elevated consistently over the past month despite taking losartan  consistently. She was recently started on metoprolol  and seems to be tolerating it well. BP in the office 179/115. I have encouraged her to contact her PCP immediately to discuss increasing dose of metoprolol . We can certainly consider repeat LP, however, most recent eye exam 12/2022 was not concerning for papilledema. Once BP is better managed we will consider repeating LP if headaches continue. I have given her Nurtec samples to help with migraine abortion while working on BP management. I have also suggested she consider adding magnesium  supplements if approved by her care team. Healthy lifestyle habits encouraged. She will follow up with PCP as directed. She will return to see me in 6 months but will reach out via Mychart in 4-6 weeks if headaches are not improving despite BP management. She verbalizes understanding and agreement with this  plan.   No orders of the defined types were placed in this encounter.    No orders of the defined types were placed in this encounter.   I spent 30 minutes of face-to-face and non-face-to-face time with patient.  This included previsit chart review, lab review, study review, order entry, electronic health record documentation, patient education.   Greig Forbes, MSN, FNP-C 09/30/2023, 7:03 PM  [facility] [address], [address] [place], [place] [address] [phone]

## 2023-09-30 NOTE — Patient Instructions (Incomplete)
Below is our plan:  We will continue phenobarbital as prescribed. I will update labs. Discuss option for an oral appliance with your dentist. Consider weight management. Dr Beasley with [facility] could be an option. Try amitriptyline for headaches per PCP direction.   Please continue using your CPAP regularly. While your insurance requires that you use CPAP at least 4 hours each night on 70% of the nights, I recommend, that you not skip any nights and use it throughout the night if you can. Getting used to CPAP and staying with the treatment long term does take time and patience and discipline. Untreated obstructive sleep apnea when it is moderate to severe can have an adverse impact on cardiovascular health and raise her risk for heart disease, arrhythmias, hypertension, congestive heart failure, stroke and diabetes. Untreated obstructive sleep apnea causes sleep disruption, nonrestorative sleep, and sleep deprivation. This can have an impact on your day to day functioning and cause daytime sleepiness and impairment of cognitive function, memory loss, mood disturbance, and problems focussing. Using CPAP regularly can improve these symptoms.  Please make sure you are staying well hydrated. I recommend 50-60 ounces daily. Well balanced diet and regular exercise encouraged. Consistent sleep schedule with 6-8 hours recommended.   Please continue follow up with care team as directed.   Follow up with me in 6 months   You may receive a survey regarding today's visit. I encourage you to leave honest feed back as I do use this information to improve patient care. Thank you for seeing me today!    

## 2023-10-01 ENCOUNTER — Telehealth: Payer: Self-pay | Admitting: Family Medicine

## 2023-10-01 ENCOUNTER — Encounter: Payer: Self-pay | Admitting: Family Medicine

## 2023-10-01 ENCOUNTER — Ambulatory Visit: Payer: BC Managed Care – PPO | Admitting: Family Medicine

## 2023-10-01 DIAGNOSIS — G4733 Obstructive sleep apnea (adult) (pediatric): Secondary | ICD-10-CM

## 2023-10-01 DIAGNOSIS — G43719 Chronic migraine without aura, intractable, without status migrainosus: Secondary | ICD-10-CM

## 2023-10-01 NOTE — Telephone Encounter (Signed)
 Patient cancelled appointment due to called into for an emergency. Will call back to reschedule. Informed patient will be a show fee of $50, patient stated, will pay show fee when call back to schedule appointment.

## 2023-10-06 ENCOUNTER — Other Ambulatory Visit: Payer: Self-pay | Admitting: Family Medicine

## 2023-10-06 DIAGNOSIS — G5691 Unspecified mononeuropathy of right upper limb: Secondary | ICD-10-CM

## 2023-10-06 DIAGNOSIS — M62838 Other muscle spasm: Secondary | ICD-10-CM

## 2023-10-06 DIAGNOSIS — M5090 Cervical disc disorder, unspecified, unspecified cervical region: Secondary | ICD-10-CM

## 2023-10-21 DIAGNOSIS — G4733 Obstructive sleep apnea (adult) (pediatric): Secondary | ICD-10-CM | POA: Diagnosis not present

## 2023-10-23 ENCOUNTER — Encounter: Payer: Self-pay | Admitting: Physician Assistant

## 2023-10-23 ENCOUNTER — Ambulatory Visit: Admitting: Physician Assistant

## 2023-10-23 VITALS — BP 137/89 | HR 93 | Temp 98.5°F | Ht 67.0 in | Wt 348.0 lb

## 2023-10-23 DIAGNOSIS — U071 COVID-19: Secondary | ICD-10-CM

## 2023-10-23 DIAGNOSIS — J029 Acute pharyngitis, unspecified: Secondary | ICD-10-CM | POA: Diagnosis not present

## 2023-10-23 DIAGNOSIS — R0981 Nasal congestion: Secondary | ICD-10-CM

## 2023-10-23 LAB — POCT RAPID STREP A (OFFICE): Rapid Strep A Screen: NEGATIVE

## 2023-10-23 LAB — POC COVID19 BINAXNOW: SARS Coronavirus 2 Ag: POSITIVE — AB

## 2023-10-23 MED ORDER — BENZONATATE 100 MG PO CAPS: 100.0000 mg | ORAL_CAPSULE | Freq: Two times a day (BID) | ORAL | 0 refills | Status: DC | PRN

## 2023-10-23 MED ORDER — IPRATROPIUM BROMIDE 0.03 % NA SOLN: 2.0000 | Freq: Two times a day (BID) | NASAL | 12 refills | Status: AC

## 2023-10-23 NOTE — Progress Notes (Signed)
 Established patient visit   Patient: Diane Wells   DOB: 1981/03/26   43 y.o. Female  MRN: [id number] Visit Date: 10/23/2023  Today's healthcare provider: Manuelita Flatness, PA-C   Chief Complaint  Patient presents with   Sore Throat    Symptoms started Sunday.  Alternating ibuprofen  and dayquil   sinus drainage   Ear Pain    Right ear   Subjective     Pt reports sore throat, R ear pain, nasal congestion, cough, some worsening asthma symptoms including SOB starting x 2 days ago. Sick exposure including strep at work. Taking ibuprofen  and dayquil otc.   Medications: Outpatient Medications Prior to Visit  Medication Sig   albuterol  (VENTOLIN  HFA) 108 (90 Base) MCG/ACT inhaler INHALE 1-2 PUFFS INTO THE LUNGS DAILY AS NEEDED FOR SHORTNESS OF BREATH. DAW   Albuterol -Budesonide  (AIRSUPRA ) 90-80 MCG/ACT AERO Inhale 2 puffs into the lungs every 6 (six) hours as needed.   butalbital -acetaminophen -caffeine  (FIORICET ) 50-325-40 MG tablet TAKE 1 TABLET BY MOUTH EVERY 6 HOURS AS NEEDED FOR HEADACHE   cyclobenzaprine  (FLEXERIL ) 5 MG tablet TAKE 1-2 AT BEDTIME AS NEEDED FOR HEADACHE AND MUSCLE SPASM   EPINEPHrine  0.3 mg/0.3 mL IJ SOAJ injection Inject 0.3 mg into the muscle once as needed for anaphylaxis.   fexofenadine (ALLEGRA) 180 MG tablet Take 180 mg by mouth every morning.   fluticasone  (FLONASE ) 50 MCG/ACT nasal spray Place 2 sprays into both nostrils in the morning and at bedtime.   furosemide  (LASIX ) 20 MG tablet TAKE 1 (ONE) TABLET DAILY AS NEEDED (Patient taking differently: Take 20-40 mg by mouth daily as needed (swelling).)   gabapentin  (NEURONTIN ) 300 MG capsule TAKE 1-2 CAPSULES (300-600 MG TOTAL) BY MOUTH 3 (THREE) TIMES DAILY AS NEEDED.   hydrocortisone  (CORTEF ) 10 MG tablet Take 10 mg by mouth every morning.   hydroxychloroquine  (PLAQUENIL ) 200 MG tablet Take 1 tablet (200 mg total) by mouth 2 (two) times daily. Hold until ok with your rheumatologist   ketorolac  (ACULAR )  0.5 % ophthalmic solution 1 drop See admin instructions. Apply one drop into left eye daily at bedtime, may also use up to three more times daily in affected eye(s) as needed for scleritis   losartan  (COZAAR ) 50 MG tablet Take 1.5 tablets (75 mg total) by mouth daily.   meloxicam  (MOBIC ) 15 MG tablet Take 1 tablet (15 mg total) by mouth daily.   methocarbamol  (ROBAXIN ) 750 MG tablet TAKE 1 TABLET (750 MG TOTAL) BY MOUTH EVERY 8 (EIGHT) HOURS AS NEEDED FOR MUSCLE SPASMS.   metoprolol  tartrate (LOPRESSOR ) 50 MG tablet Take 1.5 tablets (75 mg total) by mouth 2 (two) times daily.   mometasone -formoterol  (DULERA ) 200-5 MCG/ACT AERO Inhale 2 puffs into the lungs in the morning and at bedtime.   ondansetron  (ZOFRAN ) 8 MG tablet Take 1 tablet (8 mg total) by mouth every 8 (eight) hours as needed for nausea or vomiting.   Tofacitinib Citrate ER (XELJANZ XR) 11 MG TB24 Take 11 mg by mouth daily.   Ubrogepant  (UBRELVY ) 100 MG TABS Take 1 tablet (100 mg total) by mouth daily.   [DISCONTINUED] cyclobenzaprine  (FLEXERIL ) 10 MG tablet Take 1 tablet (10 mg total) by mouth at bedtime. Use as needed for muscle spasm and headache   [DISCONTINUED] ipratropium (ATROVENT ) 0.03 % nasal spray Place 2 sprays into both nostrils every 12 (twelve) hours.   No facility-administered medications prior to visit.    Review of Systems  Constitutional:  Positive for fatigue. Negative for fever.  HENT:  Positive for congestion, ear pain, postnasal drip and sore throat.   Respiratory:  Positive for cough and shortness of breath.   Cardiovascular:  Negative for chest pain and leg swelling.  Gastrointestinal:  Negative for abdominal pain.  Neurological:  Negative for dizziness and headaches.       Objective    BP 137/89   Pulse 93   Temp 98.5 F (36.9 C)   Ht 5' 7 (1.702 m)   Wt (!) 348 lb (157.9 kg)   LMP 10/08/2023 (Approximate)   SpO2 100%   BMI 54.50 kg/m    Physical Exam Constitutional:      General: She is  awake.     Appearance: She is well-developed.  HENT:     Head: Normocephalic.     Right Ear: Tympanic membrane normal.     Left Ear: Tympanic membrane normal.     Mouth/Throat:     Pharynx: Posterior oropharyngeal erythema present. No oropharyngeal exudate.  Eyes:     Conjunctiva/sclera: Conjunctivae normal.  Cardiovascular:     Rate and Rhythm: Normal rate and regular rhythm.     Heart sounds: Normal heart sounds.  Pulmonary:     Effort: Pulmonary effort is normal.     Breath sounds: Normal breath sounds. No wheezing, rhonchi or rales.  Skin:    General: Skin is warm.  Neurological:     Mental Status: She is alert and oriented to person, place, and time.  Psychiatric:        Attention and Perception: Attention normal.        Mood and Affect: Mood normal.        Speech: Speech normal.        Behavior: Behavior is cooperative.     Results for orders placed or performed in visit on 10/23/23  POC COVID-19 BinaxNow  Result Value Ref Range   SARS Coronavirus 2 Ag Positive (A) Negative  POCT rapid strep A  Result Value Ref Range   Rapid Strep A Screen Negative Negative    Assessment & Plan    COVID -     Ipratropium Bromide ; Place 2 sprays into both nostrils every 12 (twelve) hours.  Dispense: 30 mL; Refill: 12 -     Benzonatate ; Take 1 capsule (100 mg total) by mouth 2 (two) times daily as needed for cough.  Dispense: 20 capsule; Refill: 0  Sore throat -     POC COVID-19 BinaxNow -     POCT rapid strep A  Sinus congestion -     POC COVID-19 BinaxNow    Given pt's immunocompromised state; considered paxlovid . Given current symptoms and potential for rebound systems, mutual decision to forgo. Cont supportive tx, inhalers, rest, fluids, rx tesalon, pt needs refill ipratropium inhaler.  Return if symptoms worsen or fail to improve.       Manuelita Flatness, PA-C  [facility] [phone] (phone) [phone] (fax)  [facility]

## 2023-10-28 DIAGNOSIS — Z7951 Long term (current) use of inhaled steroids: Secondary | ICD-10-CM | POA: Diagnosis not present

## 2023-10-28 DIAGNOSIS — R0989 Other specified symptoms and signs involving the circulatory and respiratory systems: Secondary | ICD-10-CM | POA: Diagnosis not present

## 2023-10-28 DIAGNOSIS — Z7960 Long term (current) use of unspecified immunomodulators and immunosuppressants: Secondary | ICD-10-CM | POA: Diagnosis not present

## 2023-10-28 DIAGNOSIS — M069 Rheumatoid arthritis, unspecified: Secondary | ICD-10-CM | POA: Diagnosis not present

## 2023-10-28 DIAGNOSIS — R0902 Hypoxemia: Secondary | ICD-10-CM | POA: Diagnosis not present

## 2023-10-28 DIAGNOSIS — E2740 Unspecified adrenocortical insufficiency: Secondary | ICD-10-CM | POA: Diagnosis not present

## 2023-10-28 DIAGNOSIS — Z6841 Body Mass Index (BMI) 40.0 and over, adult: Secondary | ICD-10-CM | POA: Diagnosis not present

## 2023-10-28 DIAGNOSIS — J4550 Severe persistent asthma, uncomplicated: Secondary | ICD-10-CM | POA: Diagnosis not present

## 2023-10-28 DIAGNOSIS — D84821 Immunodeficiency due to drugs: Secondary | ICD-10-CM | POA: Diagnosis not present

## 2023-10-28 DIAGNOSIS — B370 Candidal stomatitis: Secondary | ICD-10-CM | POA: Diagnosis not present

## 2023-10-28 DIAGNOSIS — E6609 Other obesity due to excess calories: Secondary | ICD-10-CM | POA: Diagnosis not present

## 2023-10-28 DIAGNOSIS — J1282 Pneumonia due to coronavirus disease 2019: Secondary | ICD-10-CM | POA: Diagnosis not present

## 2023-10-28 DIAGNOSIS — R918 Other nonspecific abnormal finding of lung field: Secondary | ICD-10-CM | POA: Diagnosis not present

## 2023-10-28 DIAGNOSIS — R059 Cough, unspecified: Secondary | ICD-10-CM | POA: Diagnosis not present

## 2023-10-28 DIAGNOSIS — U071 COVID-19: Secondary | ICD-10-CM | POA: Diagnosis not present

## 2023-10-28 DIAGNOSIS — R000 Tachycardia, unspecified: Secondary | ICD-10-CM | POA: Diagnosis not present

## 2023-10-28 DIAGNOSIS — I129 Hypertensive chronic kidney disease with stage 1 through stage 4 chronic kidney disease, or unspecified chronic kidney disease: Secondary | ICD-10-CM | POA: Diagnosis not present

## 2023-10-28 DIAGNOSIS — M3130 Wegener's granulomatosis without renal involvement: Secondary | ICD-10-CM | POA: Diagnosis not present

## 2023-10-28 DIAGNOSIS — R519 Headache, unspecified: Secondary | ICD-10-CM | POA: Diagnosis not present

## 2023-10-28 DIAGNOSIS — R0602 Shortness of breath: Secondary | ICD-10-CM | POA: Diagnosis not present

## 2023-10-28 DIAGNOSIS — N189 Chronic kidney disease, unspecified: Secondary | ICD-10-CM | POA: Diagnosis not present

## 2023-10-28 DIAGNOSIS — I10 Essential (primary) hypertension: Secondary | ICD-10-CM | POA: Diagnosis not present

## 2023-10-29 DIAGNOSIS — R0602 Shortness of breath: Secondary | ICD-10-CM | POA: Diagnosis not present

## 2023-10-29 DIAGNOSIS — R000 Tachycardia, unspecified: Secondary | ICD-10-CM | POA: Diagnosis not present

## 2023-10-29 DIAGNOSIS — U071 COVID-19: Secondary | ICD-10-CM | POA: Diagnosis not present

## 2023-10-29 DIAGNOSIS — J1282 Pneumonia due to coronavirus disease 2019: Secondary | ICD-10-CM | POA: Diagnosis not present

## 2023-10-30 DIAGNOSIS — J189 Pneumonia, unspecified organism: Secondary | ICD-10-CM | POA: Diagnosis not present

## 2023-10-31 DIAGNOSIS — J189 Pneumonia, unspecified organism: Secondary | ICD-10-CM | POA: Diagnosis not present

## 2023-11-01 DIAGNOSIS — J189 Pneumonia, unspecified organism: Secondary | ICD-10-CM | POA: Diagnosis not present

## 2023-11-02 DIAGNOSIS — J984 Other disorders of lung: Secondary | ICD-10-CM | POA: Diagnosis not present

## 2023-11-02 DIAGNOSIS — J189 Pneumonia, unspecified organism: Secondary | ICD-10-CM | POA: Diagnosis not present

## 2023-11-02 DIAGNOSIS — J90 Pleural effusion, not elsewhere classified: Secondary | ICD-10-CM | POA: Diagnosis not present

## 2023-11-02 DIAGNOSIS — R918 Other nonspecific abnormal finding of lung field: Secondary | ICD-10-CM | POA: Diagnosis not present

## 2023-11-03 DIAGNOSIS — J189 Pneumonia, unspecified organism: Secondary | ICD-10-CM | POA: Diagnosis not present

## 2023-11-04 DIAGNOSIS — J189 Pneumonia, unspecified organism: Secondary | ICD-10-CM | POA: Diagnosis not present

## 2023-11-04 NOTE — Discharge Summary (Signed)
 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  For full details, please see progress notes, consult notes and ancillary notes.  The patient's hospital course will be summarized in a problem based approach below.   The patient is a 43 yo female with PMH notable for GPA and rheumatoid arthritis who was admitted to [facility] on 10/28/2023 for COVID-19 complicated by superimposed pneumonia. She was admitted and started on ceftriaxone  and azithromycin, though azithromycin was later discontinued. Her home tofacitinib was held in the setting of active infection, and her home losartan  was held due to concern of sepsis.   She is being [facility] on [facility]23.  Multifocal community acquired pneumonia due to unspecified organism in an immunocompromised host -completed 7 days ctx CT scan- Patchy bilateral ground-glass airspace disease likely reflects atypical/viral infection.  Given bronchodilator/steroid nebs Add mucinex Hycodan prn   COVID-19 Hypoxia with exertion -Deemed outside of window for remdesivir Started decadron  CTA negative for PE, trace pleural effusion Given 1x lasix  oxygen requirement improved No desats on RA  Hx Diastolic  dysfunction Last echo 2022 Previously took prn lasix  for pedal edema  BMP/mg obtained and normal Given lasix  and had improvement in swelling Hold further doses for now  Granulomatosis with polyangiitis; Rheumatoid arthritis -held hydrocortisone  with decadron  continue hydroxychloroquine  -Held tofacitinib while acutely ill Will restart on discharge  Headache Limit fioricet  to daytime Ibuprofen  sparingly Tylenol  prn   White spots in mouth Sore throat Possible thrush from decadron  Nystatin qid   BP (!) 140/90   Pulse 75   Temp 97.8 F (36.6 C) (Temporal)   Resp (!) 21   Ht 1.702 m (5' 7.01)   Wt (!) 155 kg (342 lb 0.2 oz)   SpO2 95%   BMI 53.55 kg/m    Discharge Medications:    Discharge Medications     New Medications      Sig Disp Refill Start End  HYDROcodone -homatropine 5-1.5 mg/5 mL Soln syrup Commonly known as: HYCODAN  Take 5 mL by mouth every 6 (six) hours as needed (coughing).  100 mL  0     nystatin 100,000 unit/mL suspension Commonly known as: MYCOSTATIN  Take 5 mL (500,000 Units total) by mouth 4 (four) times a day for 7 days.  140 mL  0         Medications To Continue      Sig Disp Refill Start End  acetaminophen  500 mg tablet Commonly known as: TYLENOL   Take 500 mg by mouth every 6 (six) hours as needed for mild pain (1-3).   0     Airsupra  90-80 mcg/actuation [facility] Generic drug: albuterol -budesonide   Inhale 2 puffs every 6 (six) hours as needed.   0     benzonatate  100 mg capsule Commonly known as: TESSALON   Take 100 mg by mouth 3 (three) times a day as needed.   0     butalbital -acetaminophen -caffeine  50-325-40 mg per tablet Commonly known as: FIORICET     0     cetirizine 10 mg tablet Commonly known as: ZyrTEC  Take 10 mg by mouth daily.   0     cyclobenzaprine  5 mg tablet Commonly known as: FLEXERIL   Take 5-10 mg by mouth at bedtime.   0     dextromethorphan 30 mg/5 mL Su12 12 hour suspension Commonly known as:  DELSYM  Take 5 mL by mouth every 12 (twelve) hours as needed for cough.   0     EPINEPHrine  0.3 mg/0.3 mL injection syringe Commonly known as: EPIPEN   into the thigh.   0     fexofenadine 180 mg tablet Commonly known as: ALLEGRA  Take 180 mg by mouth daily.   0     fluticasone  propionate 50 mcg/spray nasal spray Commonly known as: FLONASE     0     furosemide  40 mg tablet Commonly known as: LASIX   Take 40 mg by mouth daily as needed.   0     gabapentin  300 mg capsule Commonly known as: NEURONTIN   Take 600 mg by mouth 3 (three) times a day.   0     hydrocortisone  10 mg tablet Commonly known as: CORTEF   Take 10 mg by mouth daily.   0     hydroxychloroquine  200 mg tablet Commonly known as: PLAQUENIL     0     ibuprofen  200 mg tablet Commonly known as: MOTRIN   Take 400-800 mg by mouth every 8 (eight) hours as needed.   0     ipratropium 21 mcg (0.03 %) nasal spray Commonly known as: ATROVENT   Administer 2 sprays into each nostril See Admin Instructions. ADMINISTER 2 SPRAYS INTO AFFECTED NOSTRIL(S)   0     ketorolac  0.5 % ophthalmic solution Commonly known as: ACULAR   Administer 1 drop into left eye 4 (four) times a day as needed.   0     losartan  50 mg tablet Commonly known as: COZAAR   Take 75 mg by mouth nightly.   0     methocarbamoL  750 mg tablet Commonly known as: ROBAXIN   Take 750 mg by mouth 3 (three) times a day as needed.   0     metoprolol  tartrate 50 mg tablet Commonly known as: LOPRESSOR   Take 75 mg by mouth 2 (two) times a day.   0     mometasone -formoterol  200-5 mcg/actuation [facility] inhaler Commonly known as: DULERA   Inhale 2 puffs 2 (two) times a day.   0     multivitamin Tab tablet Commonly known as: THERAGRAN  Take 1 tablet by mouth daily.   0     ondansetron  8 mg tablet Commonly known as: ZOFRAN   Take 8 mg by mouth every 8 (eight) hours as needed.   0     polyvinyl alcohol 1.4 % ophthalmic solution Commonly known as:  LIQUIFILM TEARS  Administer 1 drop into both eyes as needed for dry eyes.   0     prednisoLONE  acetate 1 % ophthalmic suspension Commonly known as: PRED FORTE   Administer 1 drop into both eyes as needed.   0     Xeljanz XR 11 mg Tb24 Generic drug: tofacitinib  Take 11 mg by mouth at bedtime.   0         Stopped Medications    cetirizine-pseudoePHEDrine 5-120 mg ER tablet  Commonly known as: ZyrTEC        Disposition:   Patient discharged to home in Good condition.  Discharge Orders     Full Code     Lifting Limits:     Details:    Lifting Limits: No lifting limits        Already scheduled appointments (if any): No future appointments.        -----------------------------------------------------------------------------------------------------------------------------------------------------------------------------------------------------------------------------------------------------------------------------------------------------------------------------  Location Information: Patient State (at time of visit): [place]  Patient Location (at time of visit):Home/Other Non-Medical  Provider Location: Home Is provider licensed to provide clinical care in the current location/state of the patient? Yes   Consent:  Patient's identity was confirmed. Presenting condition or illness was discussed with the patient/personal representative. Current proposed treatment for presenting condition or illness was explained to patient/personal representative along with the likely benefits and any significant risks or complications associated with the provision of treatment by audio/video means. The patient/personal representative verbally authorized treatment to be provided by audio/video, which may include a limited review of patient's current health status, medication, or other treatment recommendations, patient education, and an opportunity to ask questions about  condition and treatment. Verbal Consent Granted by Patient/Personal Representative:Yes   Visit Information: Modality: 2-Way Real-Time Audio/Video  Video Start Time: 903a Video Total Time: 7 min    I have personally spent 35 minutes involved in face-to-face and non-face-to-face activities for this patient on the day of the visit.  Professional time spent includes the following activities, in addition to those noted in the documentation: Chart review, documentation, blood work/results review and care coordination.  -----------------------------------------------------------------------------------------------------------------------------------------------------------------------------------------------------------------------------------------------------------------------------------------------------------------------------  Electronically signed by: Peyton Almarie Dodge, MD 11/04/2023 4:19 PM

## 2023-11-04 NOTE — Nursing Note (Signed)
 Patient ID: Diane Wells is a 43 y.o. female.  Diane Wells was seen today at their residence by [employer] [facility])  for a [facility]  first visit follow-up by Gregorio Collier, EMT-P Today's virtual, and physical care team included the attending physician, registered nurse, and paramedic.  Significant patient reported symptoms during today's visit: headache Patient denies: Chest pains, dyspnea, palpitations, nausea, vomiting, worsening cough, and abdominal pains/ discomfort  Current Admission Length: 7 day(s) Current Admission Diagnosis:  1. Pneumonia of both lungs due to infectious organism, unspecified part of lung Active  2. COVID-19 Active  3. Chronic kidney disease, unspecified CKD stage Active  4. History of granulomatosis with polyangiitis Active    Environment Screening: Oceanographer Screen Completed: Reassessment Environment Safety Screen Reassess: No change Living Arrangements: Lives with other family member Patient Lives With Other Person(s): Around the clock Patient Lives In Congregate Setting: Around the clock Patient Receives Assistance From: Person living in home Mobility Barriers Present In Home: Stairs with handrails Structural Barriers Present In Home: Stairs inside home, Stairs outside home Environment Safety Hazards: None Environment Sanitation Issues: None  Vital Signs:  11/04/2023 Patient Vitals for the past 24 hrs:  BP Pulse Resp SpO2 Weight  11/04/23 0846 (!) 140/90 75 -- 95 % (!) 155 kg (342 lb 0.2 oz)  11/04/23 0755 138/84 68 (!) 21 98 % --  11/04/23 0417 -- 65 20 95 % --  11/03/23 2307 -- 71 (!) 25 96 % --  11/03/23 1925 138/85 72 (!) 22 93 % --  11/03/23 1743 -- 63 (!) 22 94 % --  11/03/23 1439 -- 81 19 97 % --  11/03/23 1400 -- 66 (!) 22 96 % --  11/03/23 0940 127/75 72 19 96 % (!) 156 kg (343 lb 0.4 oz)  11/03/23 0908 -- -- -- 96 % --   O2 Device: None (Room air) O2 Flow Rate  (L/min): 1 L/min   Pertinent ROS Neurological Neuro (WDL): Within Defined Limits Level of Consciousness: Alert Orientation Level: Oriented X4 HEENT HEENT (WDL): Within Defined Limits Throat: Painful to swallow Respiratory Bilateral Breath Sounds: Clear Gastrointestinal Gastrointestinal (WDL): Exceptions to WDL Abdomen Inspection: Rounded GI Symptoms: Diarrhea Cardiac Cardiac (WDL): Within Defined Limits Psychosocial Psychosocial (WDL): Within Defined Limits Genitourinary Genitourinary (WDL): Within Defined Limits Genitourinary Symptoms: Diminished urine production __________________________________________________________  [facility]-Paramedic Visit Summary: Arrived to find patient sitting on couch not wearing her oxygen. She states she last wore oxygen yesterday. She states she's not as winded or SOB when ambulating. She states oxygen dropped once yesterday to 87. No dizziness , lightheadedness or blurry vision. 4/10 headache. She has not taken anything for pain. Back pain is improving, she still reports a sore throat. She is still having diarrhea. No nausea or vomiting. Appetite is improving, no change to fluid intake or urine output. No chills, body aches or fatigue. Lung sounds are clear. No swelling to extremities. She has questions regarding some of her medications .   Paramedic Visit Goals / Paramedic Patient Handoff Notes: Discharged.   (The above visit goals, and paramedic handoff notes are not considered patient orders. This information is for reference only. Please refer to the patients active order sets for up-to-date information)  Tasks Performed: Labs Drawn: None Interventions/Tasks Performed: N/A Was [facility] food delivered during this visit?: No [facility] supplied food temp upon delivery: N/A  Medication: Medications Administered: None Medications were reviewed: Yes All Medications were accounted  for: Yes  Med-Kit photos uplaoded to secure chat for cross verification?:  No Were medications ordered at this visit?: No  Patient Education Provided: Discharged.  I encouraged the patient to contact nursing should they develop - any worrisome symptoms, or have any questions regarding their current care plan.  Other: Video visit established successfully with the care team via [facility] / [employer]. Interpreter was used today:NONE Patient being Monitored by [facility] RPM: Yes __________________________________________________________ Signed, 11/04/2023  8:55 AM Gregorio Collier, EMT-P- Inpatient Paramedic  [facility] Nursing Line: [phone] Business Hours Office Line: [phone]

## 2023-11-05 ENCOUNTER — Telehealth: Payer: Self-pay | Admitting: *Deleted

## 2023-11-05 ENCOUNTER — Telehealth: Payer: Self-pay

## 2023-11-05 ENCOUNTER — Telehealth: Payer: Self-pay | Admitting: Internal Medicine

## 2023-11-05 MED ORDER — BUDESONIDE 0.25 MG/2ML IN SUSP
0.2500 mg | Freq: Two times a day (BID) | RESPIRATORY_TRACT | 3 refills | Status: DC
Start: 2023-11-05 — End: 2023-11-06

## 2023-11-05 MED ORDER — FORMOTEROL FUMARATE 20 MCG/2ML IN NEBU: 20.0000 ug | INHALATION_SOLUTION | Freq: Two times a day (BID) | RESPIRATORY_TRACT | 3 refills | Status: DC

## 2023-11-05 NOTE — Transitions of Care (Post Inpatient/ED Visit) (Signed)
   11/05/2023  Name: Diane Wells MRN: [id number] DOB: February 12, 1981  Today's TOC FU Call Status: Today's TOC FU Call Status:: Successful TOC FU Call Completed TOC FU Call Complete Date: 11/05/23 Patient's Name and Date of Birth confirmed.  Transition Care Management Follow-up Telephone Call Date of Discharge: 11/04/23 Discharge Facility: Other (Non-[facility] Facility) Name of Other (Non[facility]) Discharge Facility: [facility] Type of Discharge: Inpatient Admission Primary Inpatient Discharge Diagnosis:: COVID, Pneumonia How have you been since you were released from the hospital?: Better Any questions or concerns?: Yes Patient Questions/Concerns:: I need some refills prior to my HFU on 11/07/23 Patient Questions/Concerns Addressed: Notified Provider of Patient Questions/Concerns  Items Reviewed: Did you receive and understand the discharge instructions provided?: Yes Medications obtained,verified, and reconciled?: Yes (Medications Reviewed) Any new allergies since your discharge?: No Dietary orders reviewed?: NA Do you have support at home?: Yes People in Home [RPT]: parent(s) Name of Support/Comfort Primary Source: [name]  Medications Reviewed Today: Medications Reviewed Today   Medications were not reviewed in this encounter     Home Care and Equipment/Supplies: Were Home Health Services Ordered?: NA Any new equipment or medical supplies ordered?: NA  Functional Questionnaire: Do you need assistance with bathing/showering or dressing?: No Do you need assistance with meal preparation?: No Do you need assistance with eating?: No Do you have difficulty maintaining continence: No Do you need assistance with getting out of bed/getting out of a chair/moving?: No Do you have difficulty managing or taking your medications?: No  Follow up appointments reviewed: PCP Follow-up appointment confirmed?: Yes Date of PCP follow-up appointment?: 11/07/23 Follow-up Provider: [facility] [place] Follow-up appointment confirmed?: NA Do you need transportation to your follow-up appointment?: No Do you understand care options if your condition(s) worsen?: Yes-patient verbalized understanding  SDOH Interventions Today    Flowsheet Row Most Recent Value  SDOH Interventions   Food Insecurity Interventions Intervention Not Indicated  Housing Interventions Intervention Not Indicated  Transportation Interventions Intervention Not Indicated  Utilities Interventions Intervention Not Indicated    Medford Balboa, BSN, RN [place]  [facility] - [facility] RN Care Manager [phone]

## 2023-11-05 NOTE — Telephone Encounter (Signed)
 PI called and spoke to pt. Pt states she needs Formoterol  and Pulmicort  and albuterol  neb and inhaler PRN but she states she has plenty of the albuterol . Pt states she has enough until tomorrow night and she does have an appt with her PCP on Wednesday. Pt states she can se if the PCP will prescribe her some for now but wanted to see if Dr Meade would take over the prescriptions as her pulmonologist. Pt also needs refills on Dulera .

## 2023-11-05 NOTE — Progress Notes (Deleted)
   Acute Office Visit  Subjective:     Patient ID: Diane Wells, female    DOB: Jun 12, 1980, 43 y.o.   MRN: [id number]  No chief complaint on file.   HPI Patient is in today for ***  ROS      Objective:    LMP 10/08/2023 (Approximate)  {Vitals History (Optional):23777}  Physical Exam  No results found for any visits on 11/07/23.      Assessment & Plan:   Problem List Items Addressed This Visit   None   No orders of the defined types were placed in this encounter.   No follow-ups on file.  Idalia Allbritton L Krysia Zahradnik, NP

## 2023-11-05 NOTE — Addendum Note (Signed)
 Addended by: WATT RAISIN C on: 11/05/2023 08:16 PM   Modules accepted: Orders

## 2023-11-05 NOTE — Telephone Encounter (Signed)
 PT sched a HFU but would like to know if Dr. Meade will order a neb and the solutions that were issued at her hospital visit. She will need these before sched appt. Here. TY. Please call Pt to advise.

## 2023-11-06 ENCOUNTER — Telehealth: Payer: Self-pay | Admitting: Internal Medicine

## 2023-11-06 ENCOUNTER — Telehealth: Payer: Self-pay

## 2023-11-06 ENCOUNTER — Other Ambulatory Visit (HOSPITAL_COMMUNITY): Payer: Self-pay

## 2023-11-06 DIAGNOSIS — J189 Pneumonia, unspecified organism: Secondary | ICD-10-CM | POA: Insufficient documentation

## 2023-11-06 MED ORDER — FORMOTEROL FUMARATE 20 MCG/2ML IN NEBU: 20.0000 ug | INHALATION_SOLUTION | Freq: Two times a day (BID) | RESPIRATORY_TRACT | 11 refills | Status: DC

## 2023-11-06 MED ORDER — BUDESONIDE 0.25 MG/2ML IN SUSP
0.2500 mg | Freq: Two times a day (BID) | RESPIRATORY_TRACT | 11 refills | Status: AC
Start: 2023-11-06 — End: ?

## 2023-11-06 MED ORDER — MOMETASONE FURO-FORMOTEROL FUM 200-5 MCG/ACT IN AERO: 2.0000 | INHALATION_SPRAY | Freq: Two times a day (BID) | RESPIRATORY_TRACT | 11 refills | Status: DC

## 2023-11-06 MED ORDER — ALBUTEROL SULFATE (2.5 MG/3ML) 0.083% IN NEBU: 2.5000 mg | INHALATION_SOLUTION | Freq: Four times a day (QID) | RESPIRATORY_TRACT | 12 refills | Status: AC | PRN

## 2023-11-06 NOTE — Assessment & Plan Note (Signed)
 Well controlled, no changes to meds. Encouraged heart healthy diet such as the DASH diet and exercise as tolerated.

## 2023-11-06 NOTE — Telephone Encounter (Signed)
 Perforomist  not covered by insurance. Routed to PA team for PA submission if appropriate  Sherry Pennant, PharmD, MPH, BCPS, CPP Clinical Pharmacist (Rheumatology and Pulmonology)

## 2023-11-06 NOTE — Telephone Encounter (Signed)
 Pharmacy Patient Advocate Encounter   Received notification from CoverMyMeds that prior authorization for Perforomist  is required/requested.   Insurance verification completed.   The patient is insured through [facility] .   Per test claim: PA required; PA submitted to above mentioned insurance via Latent Key/confirmation #/EOC [id number] Status is pending

## 2023-11-06 NOTE — Telephone Encounter (Signed)
 Copied from CRM [id number]. Topic: Appointments - Appointment Scheduling >> Nov 05, 2023  1:10 PM Corean SAUNDERS wrote: Patient just released from the hospital after a week long stay with pneumonia and is seeking a hospital follow up appointment with Dr. Meade or an APP as she needs to have more nebulizer treatments as soon as possible. >> Nov 05, 2023  3:16 PM Charlanne KIDD wrote: PT states she needs a neb TX ASAP. I have no earlier appts than October w/Dr. Meade or NP. Please call PT to advise if we can work her in.  >> Nov 05, 2023  3:14 PM Charlanne O wrote: LM to call for sooner appt w/NP. Dr. Meade not avail until Nov.  Attempted to call patient to offer sooner appointment.

## 2023-11-06 NOTE — Telephone Encounter (Signed)
 Refilled

## 2023-11-06 NOTE — Assessment & Plan Note (Signed)
 Continue medications as prescribed  and maintain good hydration. Seek immediate care for fever >101F, worsening cough or shortness of breath, chest pain, confusion, or inability to keep fluids down.

## 2023-11-06 NOTE — Progress Notes (Deleted)
   Acute Office Visit  Subjective:     Patient ID: Diane Wells, female    DOB: [birth year], 43 y.o.   MRN: [id number]  No chief complaint on file.   HPI Patient is in today for FU hospital discharge.  The patient is a 43 yo female with PMH notable for GPA, rheumatoid arthritis, CKD, HTN   Followed by Rheumatology, Endocrine, Pulmonary, Nephrology  Pt was admitted to hospital 10/28/2023 for +COVID-19 complicated by bilateral superimposed pneumonia. She was admitted and started on ceftriaxone  and azithromycin, though azithromycin was later discontinued. She was out of the window for remdesivir.  Per ED note - CT scan: negative for PE, trace pleural effusion; Patchy bilateral ground-glass airspace disease likely reflects atypical/viral infection.   Asthma albuterol -budesonide  2 puffs every 6 hours as needed, budesonide  (Pulmicort ) nebulizer 2 times daily, formoterol  nebulizer 2 times daily,   Dulera  2 puffs a.m. and 2 puff pm??     Patient denies fever, chills, SOB, CP, palpitations, dyspnea, edema, HA, vision changes, N/V/D, abdominal pain, urinary symptoms, rash, weight changes, and recent illness or hospitalizations.   ROS      Objective:    LMP 10/08/2023 (Approximate)  {Vitals History (Optional):23777}  Physical Exam  General: No acute distress. Awake and conversant.  Eyes: Normal conjunctiva, anicteric. Round symmetric pupils.  ENT: Hearing grossly intact. No nasal discharge.  Neck: Neck is supple. No masses or thyromegaly.  Respiratory: CTAB. Respirations are non-labored. No wheezing.  Skin: Warm. No rashes or ulcers.  Psych: Alert and oriented. Cooperative, Appropriate mood and affect, Normal judgment.  CV: RRR. No murmur. No lower extremity edema.  MSK: Normal ambulation. No clubbing or cyanosis.  Neuro:  CN II-XII grossly normal.    No results found for any visits on 11/07/23.      Assessment & Plan:   Problem List Items Addressed This Visit      Atypical pneumonia   Continue medications as prescribed  and maintain good hydration. Seek immediate care for fever >101F, worsening cough or shortness of breath, chest pain, confusion, or inability to keep fluids down.      Essential (primary) hypertension   Well controlled, no changes to meds. Encouraged heart healthy diet such as the DASH diet and exercise as tolerated.        Other Visit Diagnoses       Hospital discharge follow-up    -  Primary     Abnormal CBC           Hypocalcemia Hospital visit showed mild hypocalcemia on 11/01/23; CMP was repeated on 8/8/ 25 and corrected calcium was within normal limits.  Magnesium  WNL.  FU with PCP for CPE in 6 weeks.  No orders of the defined types were placed in this encounter.   No follow-ups on file.  Diane Drennon L Courtni Balash, NP

## 2023-11-07 ENCOUNTER — Encounter: Payer: Self-pay | Admitting: Student

## 2023-11-07 ENCOUNTER — Ambulatory Visit (INDEPENDENT_AMBULATORY_CARE_PROVIDER_SITE_OTHER): Admitting: Student

## 2023-11-07 ENCOUNTER — Ambulatory Visit: Payer: Self-pay

## 2023-11-07 DIAGNOSIS — M25552 Pain in left hip: Secondary | ICD-10-CM | POA: Diagnosis not present

## 2023-11-07 DIAGNOSIS — M7732 Calcaneal spur, left foot: Secondary | ICD-10-CM | POA: Diagnosis not present

## 2023-11-07 DIAGNOSIS — W19XXXA Unspecified fall, initial encounter: Secondary | ICD-10-CM | POA: Diagnosis not present

## 2023-11-07 DIAGNOSIS — M25562 Pain in left knee: Secondary | ICD-10-CM | POA: Diagnosis not present

## 2023-11-07 DIAGNOSIS — I10 Essential (primary) hypertension: Secondary | ICD-10-CM

## 2023-11-07 DIAGNOSIS — M25572 Pain in left ankle and joints of left foot: Secondary | ICD-10-CM | POA: Diagnosis not present

## 2023-11-07 DIAGNOSIS — Z91199 Patient's noncompliance with other medical treatment and regimen due to unspecified reason: Secondary | ICD-10-CM

## 2023-11-07 DIAGNOSIS — J189 Pneumonia, unspecified organism: Secondary | ICD-10-CM

## 2023-11-07 DIAGNOSIS — R7989 Other specified abnormal findings of blood chemistry: Secondary | ICD-10-CM

## 2023-11-07 DIAGNOSIS — Z09 Encounter for follow-up examination after completed treatment for conditions other than malignant neoplasm: Secondary | ICD-10-CM

## 2023-11-07 DIAGNOSIS — M1712 Unilateral primary osteoarthritis, left knee: Secondary | ICD-10-CM | POA: Diagnosis not present

## 2023-11-07 DIAGNOSIS — M25569 Pain in unspecified knee: Secondary | ICD-10-CM | POA: Diagnosis not present

## 2023-11-07 NOTE — Progress Notes (Signed)
 Pt no show

## 2023-11-07 NOTE — Telephone Encounter (Signed)
 FYI Only or Action Required?: FYI only for provider.  Patient was last seen in primary care on 04/04/2023 by Copland, Harlene BROCKS, MD.  Called Nurse Triage reporting Fall.  Symptoms began today.  Interventions attempted: Nothing.  Symptoms are: gradually worsening.  Triage Disposition: Call EMS 911 Now  Patient/caregiver understands and will follow disposition?: Yes   Copied from CRM #[id number]. Topic: Clinical - Red Word Triage >> Nov 07, 2023 11:36 AM Diane Wells wrote: Red Word that prompted transfer to Nurse Triage: Diane Wells and hurt knee Reason for Disposition  [1] SEVERE weakness (e.g., unable to walk or barely able to walk, requires support) AND [2] new-onset or getting worse  Answer Assessment - Initial Assessment Questions 1. MECHANISM: How did the fall happen?     Leg buckled - left 2. DOMESTIC VIOLENCE AND ELDER ABUSE SCREENING: Did you fall because someone pushed you or tried to hurt you? If Yes, ask: Are you safe now?     na 3. ONSET: When did the fall happen? (e.g., minutes, hours, or days ago)     today 4. LOCATION: What part of the body hit the ground? (e.g., back, buttocks, head, hips, knees, hands, head, stomach) Bilateral legs/knees 5. INJURY: Did you hurt (injure) yourself when you fell? If Yes, ask: What did you injure? Tell me more about this? (e.g., body area; type of injury; pain severity)     Left hip and left leg 6. PAIN: Is there any pain? If Yes, ask: How bad is the pain? (e.g., Scale 0-10; or none, mild,      7 to 10/10 7. SIZE: For cuts, bruises, or swelling, ask: How large is it? (e.g., inches or centimeters)      na 8. PREGNANCY: Is there any chance you are pregnant? When was your last menstrual period?     na 9. OTHER SYMPTOMS: Do you have any other symptoms? (e.g., dizziness, fever, weakness; new-onset or worsening).      na 10. CAUSE: What do you think caused the fall (or falling)? (e.g., dizzy spell, tripped)       Left  leg buckled and caused pt to fall: left is unstable.  Referred pt to go to ED for evaluation and treatment.  Pt verbalized understanding.  Protocols used: Falls and [facility]

## 2023-11-07 NOTE — Telephone Encounter (Signed)
 Called the pt and there was no answer- LMTCB  It looks like we just refilled her albuterol  nebs 11/06/23  If she needs sooner appt, can see Candis at [facility] since she has openings next wk.

## 2023-11-07 NOTE — Telephone Encounter (Signed)
 Pt called back and is not sure who called about what.  She said she has appt w/ her pcp today and see what she is advised to do from her.. However, I went over all the messages routed from yesterday, and informed her of every word.  Pt verbalized understanding

## 2023-11-08 ENCOUNTER — Other Ambulatory Visit (HOSPITAL_COMMUNITY): Payer: Self-pay

## 2023-11-08 NOTE — Telephone Encounter (Signed)
 Pharmacy Patient Advocate Encounter  Received notification from [facility] that Prior Authorization for Perforomist   has been DENIED.  No reason given; No denial letter received via Fax or CMM. It has been requested and will be uploaded to the media tab once received.   PA #/Case ID/Reference #: [id number]

## 2023-11-10 NOTE — Progress Notes (Unsigned)
 [facility] [address], [address] [place], [place] [address] [phone]  Date:  11/14/2023   Name:  Diane Wells   DOB:  May 01, 1980   MRN:  [id number]  PCP:  Watt Harlene BROCKS, MD    Chief Complaint: No chief complaint on file.   History of Present Illness:  Diane Wells is a 43 y.o. 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 Ishmael at [facility] Rheumatology Dr. Buck in Neurology Dr. 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 Norine.  She was seen here in our office by my partner Manuelita Flatness on 7/29 with COVID-19.  Unfortunately she got worse and was [facility] 8/3 [facility] with superimposed pneumonia I believe much of her admission was actually through the hospital at home program  She had a CT angiogram on 8/8 which was negative for PE but did show a trace right pleural effusion Plain chest on 8/3 showed right and left perihilar infiltrates-likely needs a follow-up chest x-ray once well  Reviewed labs from recent admission CMP 8/8 looked good except low protein counts  Recent CBC showed minimally low hemoglobin hematocrit  Patient Active Problem List   Diagnosis Date Noted   Atypical pneumonia 11/06/2023   Neutropenia (HCC) 06/15/2021   Cyst of ovary, right 06/05/2021   Left thyroid  nodule 06/05/2021    Neutropenic fever (HCC) 06/04/2021   Lymphadenopathy 06/03/2021   Mouth sores 06/03/2021   SIRS (systemic inflammatory response syndrome) (HCC) 06/03/2021   Essential (primary) hypertension 05/31/2021   Iatrogenic Cushing's syndrome (HCC) 05/31/2021   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/31/2021   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   Medication management 05/27/2019   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   Patellar dislocation 01/17/2018   Acute left ankle pain 11/27/2017   Allergic reaction 02/15/2016   Moderate persistent asthma without complication 02/15/2016   Perennial and seasonal allergic rhinitis 02/15/2016   Allergic conjunctivitis 02/15/2016   Obesity, unspecified obesity severity, unspecified obesity type 01/10/2016    Past Medical History:  Diagnosis Date   Adrenal insufficiency (HCC)    Allergic rhinitis    Asthma    Astigmatism    Chronic kidney disease    Cushing's disease (HCC)    idiopathic   Granulomatosis with polyangiitis (HCC)    Hypertension    Neutropenia (HCC)    hospitalized in early 2023 for 9 days   Recurrent upper respiratory infection (URI)     Past Surgical History:  Procedure Laterality Date   ADENOIDECTOMY     ANTERIOR CERVICAL DECOMP/DISCECTOMY FUSION      Social History   Tobacco Use   Smoking status: Never   Smokeless tobacco: Never  Vaping Use   Vaping status: Never Used  Substance Use Topics   Alcohol use: Not Currently    Comment: was rare   Drug use: No    Family History  Problem Relation Age of Onset  Diabetes Mother        Type 1 Diabetes, juvenile onset   Hypertension Mother    Pulmonary Hypertension Mother    Hyperthyroidism Mother    Glaucoma Mother    Neuropathy Mother    Sjogren's syndrome Mother    Carpal tunnel syndrome Mother    Hypertension Father    Gallbladder disease Father    Breast cancer Sister 4        metastic IBC   Diabetes Maternal Uncle    Heart attack Maternal Uncle    Stroke Maternal Uncle    Hypertension Maternal Grandmother    Glaucoma Maternal Grandmother    Coronary artery disease Maternal Grandmother    Heart attack Maternal Grandmother    Angina Maternal Grandmother    Macular degeneration Maternal Grandmother    COPD Maternal Grandmother    Congestive Heart Failure Maternal Grandmother    Glaucoma Maternal Grandfather    Leukemia Paternal Grandmother    Hypertension Paternal Grandmother    Colon cancer Paternal Grandfather    Allergic rhinitis Neg Hx    Angioedema Neg Hx    Asthma Neg Hx    Eczema Neg Hx    Pseudotumor cerebri Neg Hx    Migraines Neg Hx     Allergies  Allergen Reactions   Albuterol  Shortness Of Breath and Other (See Comments)    Tolerates HFA inhaler, nebulizer treatment caused O2 drop   Other Anaphylaxis    Essential Oil, scent unknown, caused tongue swelling   Truxima  [Rituximab ] Anaphylaxis   Sulfa  Antibiotics Hives   Vancomycin  Itching    Scalp itching - felt like it was on fire    Medication list has been reviewed and updated.  Current Outpatient Medications on File Prior to Visit  Medication Sig Dispense Refill   acetaminophen  (TYLENOL ) 500 MG tablet Take 500 mg by mouth every 6 (six) hours as needed.     albuterol  (PROVENTIL ) (2.5 MG/3ML) 0.083% nebulizer solution Take 3 mLs (2.5 mg total) by nebulization every 6 (six) hours as needed for wheezing or shortness of breath. 75 mL 12   albuterol  (VENTOLIN  HFA) 108 (90 Base) MCG/ACT inhaler INHALE 1-2 PUFFS INTO THE LUNGS DAILY AS NEEDED FOR SHORTNESS OF BREATH. DAW 18 each 2   Albuterol -Budesonide  (AIRSUPRA ) 90-80 MCG/ACT AERO Inhale 2 puffs into the lungs every 6 (six) hours as needed. 10.7 g 11   benzonatate  (TESSALON ) 100 MG capsule Take 1 capsule (100 mg total) by mouth 2 (two) times daily as needed for cough. 20 capsule 0   budesonide  (PULMICORT ) 0.25 MG/2ML nebulizer solution Take  2 mLs (0.25 mg total) by nebulization 2 (two) times daily. 120 mL 11   butalbital -acetaminophen -caffeine  (FIORICET ) 50-325-40 MG tablet TAKE 1 TABLET BY MOUTH EVERY 6 HOURS AS NEEDED FOR HEADACHE 60 tablet 0   cyclobenzaprine  (FLEXERIL ) 5 MG tablet TAKE 1-2 AT BEDTIME AS NEEDED FOR HEADACHE AND MUSCLE SPASM 60 tablet 3   dextromethorphan (DELSYM) 30 MG/5ML liquid Take 30 mg by mouth 2 (two) times daily as needed for cough.     EPINEPHrine  0.3 mg/0.3 mL IJ SOAJ injection Inject 0.3 mg into the muscle once as needed for anaphylaxis.     fexofenadine (ALLEGRA) 180 MG tablet Take 180 mg by mouth every morning.     fluticasone  (FLONASE ) 50 MCG/ACT nasal spray Place 2 sprays into both nostrils in the morning and at bedtime. 16 g 11   formoterol  (PERFOROMIST ) 20 MCG/2ML nebulizer solution Take 2 mLs (20 mcg total) by nebulization  2 (two) times daily. 120 mL 11   furosemide  (LASIX ) 20 MG tablet TAKE 1 (ONE) TABLET DAILY AS NEEDED (Patient taking differently: Take 20-40 mg by mouth daily as needed (swelling).) 90 tablet 0   gabapentin  (NEURONTIN ) 300 MG capsule TAKE 1-2 CAPSULES (300-600 MG TOTAL) BY MOUTH 3 (THREE) TIMES DAILY AS NEEDED. (Patient taking differently: Take 600 mg by mouth 3 (three) times daily.) 270 capsule 1   HYDROCODONE -HOMATROPINE PO Take 5 mg by mouth every 6 (six) hours as needed.     hydrocortisone  (CORTEF ) 10 MG tablet Take 10 mg by mouth every morning.     hydroxychloroquine  (PLAQUENIL ) 200 MG tablet Take 1 tablet (200 mg total) by mouth 2 (two) times daily. Hold until ok with your rheumatologist     ipratropium (ATROVENT ) 0.03 % nasal spray Place 2 sprays into both nostrils every 12 (twelve) hours. 30 mL 12   ketorolac  (ACULAR ) 0.5 % ophthalmic solution 1 drop See admin instructions. Apply one drop into left eye daily at bedtime, may also use up to three more times daily in affected eye(s) as needed for scleritis     losartan  (COZAAR ) 50 MG tablet Take 1.5 tablets (75 mg total) by  mouth daily. 135 tablet 3   meloxicam  (MOBIC ) 15 MG tablet Take 1 tablet (15 mg total) by mouth daily. 30 tablet 2   methocarbamol  (ROBAXIN ) 750 MG tablet TAKE 1 TABLET (750 MG TOTAL) BY MOUTH EVERY 8 (EIGHT) HOURS AS NEEDED FOR MUSCLE SPASMS. (Patient taking differently: Take 750 mg by mouth daily.) 90 tablet 1   metoprolol  tartrate (LOPRESSOR ) 50 MG tablet Take 1.5 tablets (75 mg total) by mouth 2 (two) times daily. (Patient taking differently: Take 50 mg by mouth 2 (two) times daily.) 270 tablet 3   mometasone -formoterol  (DULERA ) 200-5 MCG/ACT AERO Inhale 2 puffs into the lungs in the morning and at bedtime. 1 each 11   multivitamin-iron-minerals-folic acid  (THERAPEUTIC-M) TABS tablet Take 1 tablet by mouth daily.     nystatin (MYCOSTATIN) 100000 UNIT/ML suspension Take 5 mLs by mouth 4 (four) times daily. For 7 days     ondansetron  (ZOFRAN ) 8 MG tablet Take 1 tablet (8 mg total) by mouth every 8 (eight) hours as needed for nausea or vomiting. 30 tablet 2   prednisoLONE  acetate (PRED FORTE ) 1 % ophthalmic suspension Place 1 drop into both eyes 4 (four) times daily as needed.     Tofacitinib Citrate ER (XELJANZ XR) 11 MG TB24 Take 11 mg by mouth daily.     Ubrogepant  (UBRELVY ) 100 MG TABS Take 1 tablet (100 mg total) by mouth daily. (Patient taking differently: Take 1 tablet by mouth daily as needed.) 30 tablet 2   No current facility-administered medications on file prior to visit.    Review of Systems:  As per HPI- otherwise negative.   Physical Examination: There were no vitals filed for this visit. There were no vitals filed for this visit. There is no height or weight on file to calculate BMI. Ideal Body Weight:    GEN: no acute distress. HEENT: Atraumatic, Normocephalic.  Ears and Nose: No external deformity. CV: RRR, No M/G/R. No JVD. No thrill. No extra heart sounds. PULM: CTA B, no wheezes, crackles, rhonchi. No retractions. No resp. distress. No accessory muscle use. ABD:  S, NT, ND, +BS. No rebound. No HSM. EXTR: No c/c/e PSYCH: Normally interactive. Conversant. '  Assessment and Plan: ***  Signed Harlene Schroeder, MD

## 2023-11-12 ENCOUNTER — Other Ambulatory Visit (HOSPITAL_COMMUNITY): Payer: Self-pay

## 2023-11-12 ENCOUNTER — Telehealth: Payer: Self-pay

## 2023-11-12 DIAGNOSIS — M25562 Pain in left knee: Secondary | ICD-10-CM | POA: Diagnosis not present

## 2023-11-12 DIAGNOSIS — M1712 Unilateral primary osteoarthritis, left knee: Secondary | ICD-10-CM | POA: Diagnosis not present

## 2023-11-12 NOTE — Telephone Encounter (Signed)
 Please advise on which alternative

## 2023-11-12 NOTE — Telephone Encounter (Signed)
*  Pulm  Pharmacy Patient Advocate Encounter  Received notification from [facility] that Prior Authorization for Formoterol  Fumarate 20MCG/2ML nebulizer solution  has been DENIED.  Full denial letter will be uploaded to the media tab. See denial reason below.  Per denial letter from previous PA attempt:   Preferred meds to try are Striverdi Repimat or/and Serevent Diskus.

## 2023-11-12 NOTE — Telephone Encounter (Signed)
 Please send striverdi respimat  inhaler to her pharmacy and let patient know insurance wants her to try two alternatives before covering perforomist .

## 2023-11-14 ENCOUNTER — Encounter: Payer: Self-pay | Admitting: Family Medicine

## 2023-11-14 ENCOUNTER — Other Ambulatory Visit: Payer: Self-pay | Admitting: Family Medicine

## 2023-11-14 ENCOUNTER — Ambulatory Visit: Admitting: Family Medicine

## 2023-11-14 VITALS — BP 128/86 | HR 83 | Temp 97.8°F | Ht 67.0 in | Wt 345.6 lb

## 2023-11-14 DIAGNOSIS — Z1231 Encounter for screening mammogram for malignant neoplasm of breast: Secondary | ICD-10-CM

## 2023-11-14 DIAGNOSIS — G5691 Unspecified mononeuropathy of right upper limb: Secondary | ICD-10-CM | POA: Diagnosis not present

## 2023-11-14 DIAGNOSIS — M5090 Cervical disc disorder, unspecified, unspecified cervical region: Secondary | ICD-10-CM | POA: Diagnosis not present

## 2023-11-14 DIAGNOSIS — E242 Drug-induced Cushing's syndrome: Secondary | ICD-10-CM

## 2023-11-14 DIAGNOSIS — R600 Localized edema: Secondary | ICD-10-CM

## 2023-11-14 DIAGNOSIS — G43709 Chronic migraine without aura, not intractable, without status migrainosus: Secondary | ICD-10-CM

## 2023-11-14 DIAGNOSIS — J189 Pneumonia, unspecified organism: Secondary | ICD-10-CM

## 2023-11-14 DIAGNOSIS — M62838 Other muscle spasm: Secondary | ICD-10-CM

## 2023-11-14 DIAGNOSIS — I7782 Antineutrophilic cytoplasmic antibody (ANCA) vasculitis: Secondary | ICD-10-CM

## 2023-11-14 DIAGNOSIS — J4540 Moderate persistent asthma, uncomplicated: Secondary | ICD-10-CM

## 2023-11-14 MED ORDER — HYDROCORTISONE 10 MG PO TABS: 10.0000 mg | ORAL_TABLET | Freq: Every morning | ORAL | 1 refills | Status: AC

## 2023-11-14 MED ORDER — METHOCARBAMOL 750 MG PO TABS: 750.0000 mg | ORAL_TABLET | Freq: Three times a day (TID) | ORAL | 1 refills | Status: AC | PRN

## 2023-11-14 MED ORDER — GABAPENTIN 300 MG PO CAPS
300.0000 mg | ORAL_CAPSULE | Freq: Three times a day (TID) | ORAL | 1 refills | Status: DC | PRN
Start: 2023-11-14 — End: 2024-02-11

## 2023-11-14 MED ORDER — FUROSEMIDE 20 MG PO TABS: ORAL_TABLET | ORAL | 1 refills | Status: DC

## 2023-11-14 MED ORDER — UBRELVY 100 MG PO TABS: ORAL_TABLET | ORAL | 5 refills | Status: AC

## 2023-11-14 MED ORDER — CYCLOBENZAPRINE HCL 5 MG PO TABS: ORAL_TABLET | ORAL | 3 refills | Status: AC

## 2023-11-14 MED ORDER — MOMETASONE FURO-FORMOTEROL FUM 200-5 MCG/ACT IN AERO: 2.0000 | INHALATION_SPRAY | Freq: Two times a day (BID) | RESPIRATORY_TRACT | 4 refills | Status: DC

## 2023-11-14 NOTE — Patient Instructions (Addendum)
 Good to see you today- I am sorry you have been feeling so bad Please come in for a walk-in chest film in about a month

## 2023-11-15 ENCOUNTER — Ambulatory Visit
Admission: RE | Admit: 2023-11-15 | Discharge: 2023-11-15 | Disposition: A | Discharge status: Home / Self Care | Source: Ambulatory Visit | Attending: Family Medicine | Admitting: Family Medicine

## 2023-11-15 DIAGNOSIS — Z1231 Encounter for screening mammogram for malignant neoplasm of breast: Secondary | ICD-10-CM | POA: Diagnosis not present

## 2023-11-15 MED ORDER — STRIVERDI RESPIMAT 2.5 MCG/ACT IN AERS: 2.0000 | INHALATION_SPRAY | Freq: Two times a day (BID) | RESPIRATORY_TRACT | 3 refills | Status: DC

## 2023-11-15 NOTE — Addendum Note (Signed)
 Addended by: MELVENIA WILFORD SAUNDERS on: 11/15/2023 02:16 PM   Modules accepted: Orders

## 2023-11-15 NOTE — Telephone Encounter (Signed)
 Called and spoke with patient,nothing further needed,medicine sent in

## 2023-11-17 ENCOUNTER — Encounter: Payer: Self-pay | Admitting: Family Medicine

## 2023-11-17 ENCOUNTER — Telehealth: Admitting: Family Medicine

## 2023-11-17 DIAGNOSIS — R300 Dysuria: Secondary | ICD-10-CM

## 2023-11-17 NOTE — Progress Notes (Signed)
   Thank you for the details you included in the comment boxes. Those details are very helpful in determining the best course of treatment for you and help us  to provide the best care.Because Diane Wells, we recommend that you schedule a Virtual Urgent Care video visit in order for the provider to better assess what is going on.  The provider will be able to give you a more accurate diagnosis and treatment plan if we can more freely discuss your symptoms and with the addition of a virtual examination.   If you change your visit to a video visit, we will bill your insurance (similar to an office visit) and you will not be charged for this e-Visit. You will be able to stay at home and speak with the first available [facility] advanced practice provider. The link to do a video visit is in the drop down Menu tab of your Welcome screen in MyChart.      have provided 5 minutes of non face to face time during this encounter for chart review and documentation.

## 2023-11-19 ENCOUNTER — Telehealth: Payer: Self-pay

## 2023-11-19 DIAGNOSIS — J4540 Moderate persistent asthma, uncomplicated: Secondary | ICD-10-CM

## 2023-11-19 NOTE — Telephone Encounter (Signed)
 Ok, not sure. Ill have staff call her and re-educate her

## 2023-11-19 NOTE — Telephone Encounter (Signed)
 Copied from CRM [id number]. Topic: Clinical - Medication Question >> Nov 19, 2023 12:04 PM Whitney O wrote: Reason for CRM: patient is calling because some of her meds changed and patient is needing clarification on what she is suppose to take and not suppose to take . Please reach out to patient concerning this information . Patient is requesting to speak with the doctor nurse  [phone]  Spoke with patient regarding prior message. Patient is having some question's about medication's she has recently received  Albuterol  neb Albuterol  inhaler  Pulmicort   Strivedi And Dulera  is on hold Beth can you please advise on how patient should be taking Nebulizer medication and inhalers.  Thank you

## 2023-11-19 NOTE — Telephone Encounter (Signed)
   Per her last note she should be using Dulera  two puffs morning and evening and Albuterol -budesonide  (Airsupra ) as needed up to 4 times a day for breakthrough symptoms.  What is she currently taking? Does she have Dulera  and is it effective?   According to a telephone note on 8/11 the patient was requesting nebulized solutions that she had while in the hospital but these are duplicates to her current inhaler regiment and she does not need them.    I would hold off on using the other nebulizers or inhalers right now until Dr. Meade can clarify

## 2023-11-21 NOTE — Telephone Encounter (Signed)
 Called the pt and there was no answer- LMTCB

## 2023-11-22 DIAGNOSIS — R29898 Other symptoms and signs involving the musculoskeletal system: Secondary | ICD-10-CM | POA: Diagnosis not present

## 2023-11-22 DIAGNOSIS — M170 Bilateral primary osteoarthritis of knee: Secondary | ICD-10-CM | POA: Diagnosis not present

## 2023-11-22 DIAGNOSIS — M25561 Pain in right knee: Secondary | ICD-10-CM | POA: Diagnosis not present

## 2023-11-22 DIAGNOSIS — M1711 Unilateral primary osteoarthritis, right knee: Secondary | ICD-10-CM | POA: Diagnosis not present

## 2023-11-22 DIAGNOSIS — M25562 Pain in left knee: Secondary | ICD-10-CM | POA: Diagnosis not present

## 2023-11-23 MED ORDER — ALBUTEROL SULFATE HFA 108 (90 BASE) MCG/ACT IN AERS: 1.0000 | INHALATION_SPRAY | RESPIRATORY_TRACT | 4 refills | Status: AC | PRN

## 2023-11-23 NOTE — Telephone Encounter (Signed)
 Pt called back. Pt states she had confusion about her medications, which ones to take and not to take. I told her she should be on the Striverdi inhaler now, and to also use the Budesonide  inhaler daily while the albuterol  neb and inhaler were PRN. Pt verbalized understanding. Pt also states she needs the Albuterol  inhaler refills called in to [facility] on [address] in [place]. I informed pt I could place some refills for her. Pt verbalized understanding. NFN. Refills placed.

## 2023-11-28 DIAGNOSIS — R29898 Other symptoms and signs involving the musculoskeletal system: Secondary | ICD-10-CM | POA: Diagnosis not present

## 2023-11-28 DIAGNOSIS — M25562 Pain in left knee: Secondary | ICD-10-CM | POA: Diagnosis not present

## 2023-11-30 ENCOUNTER — Telehealth: Payer: Self-pay

## 2023-11-30 NOTE — Telephone Encounter (Signed)
*  Pulm  Pharmacy Patient Advocate Encounter   Received notification from Fax that prior authorization for Formoterol  Neb Sol is required/requested.   Insurance verification completed.   The patient is insured through [facility] .   Per test claim: PA required; PA started via CoverMyMeds. KEY [id number] . Please see clinical question(s) below that I am not finding the answer to in their chart and advise.   *previous notes state med changed to striverdi respimat  inhaler. Please clarify

## 2023-12-03 DIAGNOSIS — E041 Nontoxic single thyroid nodule: Secondary | ICD-10-CM | POA: Diagnosis not present

## 2023-12-03 DIAGNOSIS — E2749 Other adrenocortical insufficiency: Secondary | ICD-10-CM | POA: Diagnosis not present

## 2023-12-03 DIAGNOSIS — E242 Drug-induced Cushing's syndrome: Secondary | ICD-10-CM | POA: Diagnosis not present

## 2023-12-05 DIAGNOSIS — R29898 Other symptoms and signs involving the musculoskeletal system: Secondary | ICD-10-CM | POA: Diagnosis not present

## 2023-12-05 DIAGNOSIS — M25562 Pain in left knee: Secondary | ICD-10-CM | POA: Diagnosis not present

## 2023-12-12 DIAGNOSIS — M25562 Pain in left knee: Secondary | ICD-10-CM | POA: Diagnosis not present

## 2023-12-12 DIAGNOSIS — R29898 Other symptoms and signs involving the musculoskeletal system: Secondary | ICD-10-CM | POA: Diagnosis not present

## 2023-12-13 DIAGNOSIS — M25561 Pain in right knee: Secondary | ICD-10-CM | POA: Diagnosis not present

## 2023-12-19 DIAGNOSIS — R809 Proteinuria, unspecified: Secondary | ICD-10-CM | POA: Diagnosis not present

## 2023-12-19 DIAGNOSIS — E2740 Unspecified adrenocortical insufficiency: Secondary | ICD-10-CM | POA: Diagnosis not present

## 2023-12-19 DIAGNOSIS — R5383 Other fatigue: Secondary | ICD-10-CM | POA: Diagnosis not present

## 2023-12-19 DIAGNOSIS — M3130 Wegener's granulomatosis without renal involvement: Secondary | ICD-10-CM | POA: Diagnosis not present

## 2023-12-19 DIAGNOSIS — M0579 Rheumatoid arthritis with rheumatoid factor of multiple sites without organ or systems involvement: Secondary | ICD-10-CM | POA: Diagnosis not present

## 2023-12-19 DIAGNOSIS — H15009 Unspecified scleritis, unspecified eye: Secondary | ICD-10-CM | POA: Diagnosis not present

## 2023-12-20 DIAGNOSIS — M25562 Pain in left knee: Secondary | ICD-10-CM | POA: Diagnosis not present

## 2023-12-20 DIAGNOSIS — S83241D Other tear of medial meniscus, current injury, right knee, subsequent encounter: Secondary | ICD-10-CM | POA: Diagnosis not present

## 2023-12-20 DIAGNOSIS — R29898 Other symptoms and signs involving the musculoskeletal system: Secondary | ICD-10-CM | POA: Diagnosis not present

## 2023-12-22 NOTE — Progress Notes (Deleted)
 [facility] [address], [address] [place], [place] [address] [phone]  Date:  12/26/2023   Name:  Diane Wells   DOB:  [birth year]   MRN:  [id number]  PCP:  Watt Harlene BROCKS, MD    Chief Complaint: No chief complaint on file.   History of Present Illness:  Diane Wells is a 43 y.o. very pleasant female patient who presents with the following:  Patient seen today for physical exam.  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 Ishmael at [facility] Rheumatology Dr. Buck in Neurology Dr. 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 Norine.  - Needs follow-up chest x-ray from August - Flu vaccine - COVID booster - Can offer pneumonia vaccination - Insufficient Pap smear from 2021 on chart,?  If she is seeing GYN Mammogram completed in August  Discussed the use of AI scribe software for clinical note transcription with the patient, who gave verbal consent to proceed.  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   Medication management 05/27/2019   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   Patellar dislocation 01/17/2018   Acute left ankle pain 11/27/2017   Allergic reaction 02/15/2016   Moderate persistent asthma without complication 02/15/2016   Perennial and seasonal allergic rhinitis 02/15/2016   Allergic conjunctivitis 02/15/2016   Obesity, unspecified obesity severity, unspecified obesity type 01/10/2016    Past Medical History:  Diagnosis Date   Adrenal insufficiency    Allergic rhinitis    Asthma    Astigmatism    Chronic kidney disease    Cushing's disease (HCC)    idiopathic   Granulomatosis with polyangiitis (HCC)    Hypertension    Neutropenia    hospitalized in early 2023 for 9 days   Recurrent upper respiratory infection (URI)     Past Surgical History:  Procedure Laterality Date   ADENOIDECTOMY     ANTERIOR CERVICAL DECOMP/DISCECTOMY FUSION      Social History   Tobacco Use   Smoking status: Never   Smokeless tobacco: Never  Vaping Use   Vaping status: Never Used  Substance Use Topics   Alcohol use: Not Currently    Comment: was rare   Drug use: No    Family History  Problem Relation Age of Onset   Diabetes Mother        Type 1 Diabetes, juvenile onset   Hypertension Mother    Pulmonary Hypertension Mother    Hyperthyroidism Mother    Glaucoma Mother  Neuropathy Mother    Sjogren's syndrome Mother    Carpal tunnel syndrome Mother    Hypertension Father    Gallbladder disease Father    Breast cancer Sister 89       metastic IBC   Diabetes Maternal Uncle    Heart attack Maternal Uncle    Stroke Maternal Uncle    Hypertension Maternal Grandmother    Glaucoma Maternal Grandmother    Coronary artery disease Maternal Grandmother    Heart attack  Maternal Grandmother    Angina Maternal Grandmother    Macular degeneration Maternal Grandmother    COPD Maternal Grandmother    Congestive Heart Failure Maternal Grandmother    Glaucoma Maternal Grandfather    Leukemia Paternal Grandmother    Hypertension Paternal Grandmother    Colon cancer Paternal Grandfather    Allergic rhinitis Neg Hx    Angioedema Neg Hx    Asthma Neg Hx    Eczema Neg Hx    Pseudotumor cerebri Neg Hx    Migraines Neg Hx     Allergies  Allergen Reactions   Albuterol  Shortness Of Breath and Other (See Comments)    Tolerates HFA inhaler, nebulizer treatment caused O2 drop   Other Anaphylaxis    Essential Oil, scent unknown, caused tongue swelling   Truxima  [Rituximab ] Anaphylaxis   Sulfa  Antibiotics Hives   Vancomycin  Itching    Scalp itching - felt like it was on fire    Medication list has been reviewed and updated.  Current Outpatient Medications on File Prior to Visit  Medication Sig Dispense Refill   acetaminophen  (TYLENOL ) 500 MG tablet Take 500 mg by mouth every 6 (six) hours as needed.     albuterol  (PROVENTIL ) (2.5 MG/3ML) 0.083% nebulizer solution Take 3 mLs (2.5 mg total) by nebulization every 6 (six) hours as needed for wheezing or shortness of breath. 75 mL 12   albuterol  (VENTOLIN  HFA) 108 (90 Base) MCG/ACT inhaler Inhale 1-2 puffs into the lungs every 4 (four) hours as needed for wheezing or shortness of breath. 18 each 4   Albuterol -Budesonide  (AIRSUPRA ) 90-80 MCG/ACT AERO Inhale 2 puffs into the lungs every 6 (six) hours as needed. 10.7 g 11   benzonatate  (TESSALON ) 100 MG capsule Take 1 capsule (100 mg total) by mouth 2 (two) times daily as needed for cough. 20 capsule 0   budesonide  (PULMICORT ) 0.25 MG/2ML nebulizer solution Take 2 mLs (0.25 mg total) by nebulization 2 (two) times daily. 120 mL 11   butalbital -acetaminophen -caffeine  (FIORICET ) 50-325-40 MG tablet TAKE 1 TABLET BY MOUTH EVERY 6 HOURS AS NEEDED FOR HEADACHE 60 tablet 0    cyclobenzaprine  (FLEXERIL ) 5 MG tablet TAKE 1-2 AT BEDTIME AS NEEDED FOR HEADACHE AND MUSCLE SPASM 60 tablet 3   dextromethorphan (DELSYM) 30 MG/5ML liquid Take 30 mg by mouth 2 (two) times daily as needed for cough.     EPINEPHrine  0.3 mg/0.3 mL IJ SOAJ injection Inject 0.3 mg into the muscle once as needed for anaphylaxis.     fexofenadine (ALLEGRA) 180 MG tablet Take 180 mg by mouth every morning.     fluticasone  (FLONASE ) 50 MCG/ACT nasal spray Place 2 sprays into both nostrils in the morning and at bedtime. 16 g 11   formoterol  (PERFOROMIST ) 20 MCG/2ML nebulizer solution Take 2 mLs (20 mcg total) by nebulization 2 (two) times daily. 120 mL 11   furosemide  (LASIX ) 20 MG tablet TAKE 1 (ONE) TABLET DAILY AS NEEDED 90 tablet 1   gabapentin  (NEURONTIN ) 300 MG capsule Take 1-2  capsules (300-600 mg total) by mouth 3 (three) times daily as needed. 270 capsule 1   HYDROCODONE -HOMATROPINE PO Take 5 mg by mouth every 6 (six) hours as needed.     hydrocortisone  (CORTEF ) 10 MG tablet Take 1 tablet (10 mg total) by mouth every morning. 90 tablet 1   hydroxychloroquine  (PLAQUENIL ) 200 MG tablet Take 1 tablet (200 mg total) by mouth 2 (two) times daily. Hold until ok with your rheumatologist     ipratropium (ATROVENT ) 0.03 % nasal spray Place 2 sprays into both nostrils every 12 (twelve) hours. 30 mL 12   ketorolac  (ACULAR ) 0.5 % ophthalmic solution 1 drop See admin instructions. Apply one drop into left eye daily at bedtime, may also use up to three more times daily in affected eye(s) as needed for scleritis     losartan  (COZAAR ) 50 MG tablet Take 1.5 tablets (75 mg total) by mouth daily. 135 tablet 3   meloxicam  (MOBIC ) 15 MG tablet Take 1 tablet (15 mg total) by mouth daily. (Patient not taking: Reported on 11/14/2023) 30 tablet 2   methocarbamol  (ROBAXIN ) 750 MG tablet Take 1 tablet (750 mg total) by mouth every 8 (eight) hours as needed for muscle spasms. 90 tablet 1   metoprolol  tartrate (LOPRESSOR ) 50 MG  tablet Take 1.5 tablets (75 mg total) by mouth 2 (two) times daily. (Patient taking differently: Take 50 mg by mouth 2 (two) times daily.) 270 tablet 3   mometasone -formoterol  (DULERA ) 200-5 MCG/ACT AERO Inhale 2 puffs into the lungs in the morning and at bedtime. 3 each 4   multivitamin-iron-minerals-folic acid  (THERAPEUTIC-M) TABS tablet Take 1 tablet by mouth daily.     nystatin (MYCOSTATIN) 100000 UNIT/ML suspension Take 5 mLs by mouth 4 (four) times daily. For 7 days     Olodaterol HCl (STRIVERDI RESPIMAT ) 2.5 MCG/ACT AERS Inhale 2 puffs into the lungs in the morning and at bedtime. 4 g 3   ondansetron  (ZOFRAN ) 8 MG tablet Take 1 tablet (8 mg total) by mouth every 8 (eight) hours as needed for nausea or vomiting. 30 tablet 2   prednisoLONE  acetate (PRED FORTE ) 1 % ophthalmic suspension Place 1 drop into both eyes 4 (four) times daily as needed.     Tofacitinib Citrate ER (XELJANZ XR) 11 MG TB24 Take 11 mg by mouth daily.     Ubrogepant  (UBRELVY ) 100 MG TABS Take 100 mg by mouth once as needed for migraine.  May repeat in one hour. Max dose 200 in 24 hours 16 tablet 5   No current facility-administered medications on file prior to visit.    Review of Systems:  As per HPI- otherwise negative.   Physical Examination: There were no vitals filed for this visit. There were no vitals filed for this visit. There is no height or weight on file to calculate BMI. Ideal Body Weight:    GEN: no acute distress. HEENT: Atraumatic, Normocephalic.  Ears and Nose: No external deformity. CV: RRR, No M/G/R. No JVD. No thrill. No extra heart sounds. PULM: CTA B, no wheezes, crackles, rhonchi. No retractions. No resp. distress. No accessory muscle use. ABD: S, NT, ND, +BS. No rebound. No HSM. EXTR: No c/c/e PSYCH: Normally interactive. Conversant.    Assessment and Plan: No diagnosis found.  Assessment & Plan   Signed Harlene Schroeder, MD

## 2023-12-22 NOTE — Patient Instructions (Incomplete)
 It was good to see you today, I will be in touch with your lab results -Recommend a COVID booster this fall

## 2023-12-24 ENCOUNTER — Ambulatory Visit (HOSPITAL_BASED_OUTPATIENT_CLINIC_OR_DEPARTMENT_OTHER)
Admission: RE | Admit: 2023-12-24 | Discharge: 2023-12-24 | Disposition: A | Discharge status: Home / Self Care | Source: Ambulatory Visit | Attending: Family Medicine | Admitting: Family Medicine

## 2023-12-24 ENCOUNTER — Encounter: Payer: Self-pay | Admitting: Family Medicine

## 2023-12-24 DIAGNOSIS — J189 Pneumonia, unspecified organism: Secondary | ICD-10-CM | POA: Diagnosis not present

## 2023-12-24 DIAGNOSIS — Z0389 Encounter for observation for other suspected diseases and conditions ruled out: Secondary | ICD-10-CM | POA: Diagnosis not present

## 2023-12-26 ENCOUNTER — Encounter: Admitting: Family Medicine

## 2023-12-29 DIAGNOSIS — M25562 Pain in left knee: Secondary | ICD-10-CM | POA: Diagnosis not present

## 2023-12-31 ENCOUNTER — Encounter: Admitting: Family Medicine

## 2024-01-02 DIAGNOSIS — M25561 Pain in right knee: Secondary | ICD-10-CM | POA: Diagnosis not present

## 2024-01-06 NOTE — Progress Notes (Signed)
 [facility] [address], [address] [place], [place] [address] [phone]  Date:  01/14/2024   Name:  Diane Wells   DOB:  23-Jul-1980   MRN:  [id number]  PCP:  Watt Harlene BROCKS, MD    Chief Complaint: Annual Exam (Vaccines )   History of Present Illness:  Diane Wells is a 43 y.o. very pleasant female patient who presents with the following:  Patient seen today for physical exam.  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 Ishmael at [facility] Rheumatology Dr Faythe endocrinology at [facility]- he is following her TSH Dr. Buck in Neurology Dr. 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 Norine. Pt notes she has been released Dr Abagail at [facility] is managing her arthritis in her knees- she may have a meniscal tear   We already did follow-up chest x-ray from August - Flu vaccine- will do now  - COVID booster- recommend in about 90 days  - Can offer pneumonia vaccination; prevnar  - Insufficient Pap smear from 2021 on chart, we can update  Mammogram completed in August  Discussed the use of AI scribe software for clinical note transcription with the patient, who gave verbal consent to proceed.  History of Present Illness Diane Wells is a 43 year old female who presents for an annual physical exam and guidance on vaccinations.  As above, she recently recovered from pneumonia  complicated by COVID-19.  She is glad to be feeling better  She has rheumatoid arthritis and is currently taking Earma, which has significantly alleviated her joint symptoms. However, missing doses due to pharmacy delays exacerbates her joint pain. She also takes Plaquenil  twice daily for her rheumatoid arthritis, as well as hydrocortisone  10 mg daily for adrenal insufficiency  She has been experiencing increased ankle swelling and has been using furosemide  daily for the past several weeks. She also takes losartan , which requires a refill.  She has a history of knee injuries, including a torn MCL in the left knee and a chronic ACL tear with a recent meniscus tear in the right knee.  She has seen sports medicine and orthopedics but has not yet talked to an orthopedic surgeon.  They have tried a couple of steroid shots which gave limited relief.  It sounds like they plan to do a scope for meniscal tear She has thyroid  nodules being monitored with an upcoming ultrasound in February. Her thyroid  function tests, including TSH, have been normal.  She uses Ubrelvy  effectively for migraines and has a sufficient supply of Zofran  for nausea, which she uses as needed.   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   Medication management 05/27/2019   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   Patellar dislocation 01/17/2018   Acute left ankle pain 11/27/2017   Allergic  reaction 02/15/2016   Moderate persistent asthma without complication 02/15/2016   Perennial and seasonal allergic rhinitis 02/15/2016   Allergic conjunctivitis 02/15/2016   Obesity, unspecified obesity severity, unspecified obesity type 01/10/2016    Past Medical History:  Diagnosis Date   Adrenal insufficiency    Allergic rhinitis    Asthma    Astigmatism    Chronic kidney disease    Cushing's disease (HCC)    idiopathic   Granulomatosis with polyangiitis (HCC)    Hypertension    Neutropenia    hospitalized in early 2023 for 9 days   Recurrent upper respiratory infection (URI)     Past Surgical History:  Procedure Laterality Date   ADENOIDECTOMY     ANTERIOR CERVICAL DECOMP/DISCECTOMY FUSION      Social History   Tobacco Use   Smoking status: Never   Smokeless tobacco: Never  Vaping Use   Vaping status: Never Used  Substance Use Topics   Alcohol use: Not Currently    Comment: was rare   Drug use: No    Family History  Problem Relation Age of Onset   Diabetes Mother        Type 1 Diabetes, juvenile onset   Hypertension Mother    Pulmonary Hypertension Mother    Hyperthyroidism Mother    Glaucoma Mother    Neuropathy Mother    Sjogren's syndrome Mother    Carpal tunnel syndrome Mother    Hypertension Father    Gallbladder disease Father    Breast cancer Sister 31       metastic IBC   Diabetes Maternal Uncle    Heart attack Maternal Uncle    Stroke Maternal Uncle    Hypertension Maternal Grandmother    Glaucoma Maternal Grandmother    Coronary artery disease Maternal Grandmother    Heart attack Maternal Grandmother    Angina Maternal Grandmother    Macular degeneration Maternal Grandmother    COPD Maternal Grandmother    Congestive Heart Failure Maternal Grandmother    Glaucoma Maternal Grandfather    Leukemia Paternal Grandmother    Hypertension Paternal Grandmother    Colon cancer Paternal Grandfather    Allergic rhinitis Neg Hx    Angioedema  Neg Hx    Asthma Neg Hx    Eczema Neg Hx    Pseudotumor cerebri Neg Hx    Migraines Neg Hx     Allergies  Allergen Reactions   Albuterol  Shortness Of Breath and Other (See Comments)    Tolerates HFA inhaler, nebulizer treatment caused O2 drop   Other Anaphylaxis    Essential Oil, scent unknown, caused tongue swelling   Truxima  [Rituximab ] Anaphylaxis   Sulfa  Antibiotics Hives   Vancomycin  Itching    Scalp itching - felt like it was on fire    Medication list has been reviewed and updated.  Current Outpatient Medications on File Prior to Visit  Medication Sig Dispense Refill   albuterol  (PROVENTIL ) (2.5 MG/3ML) 0.083% nebulizer solution Take 3 mLs (2.5 mg total) by nebulization every 6 (six) hours as needed for wheezing or shortness of breath. 75 mL 12   albuterol  (VENTOLIN  HFA) 108 (90 Base) MCG/ACT inhaler Inhale 1-2 puffs into the lungs every 4 (four) hours as needed  for wheezing or shortness of breath. 18 each 4   budesonide  (PULMICORT ) 0.25 MG/2ML nebulizer solution Take 2 mLs (0.25 mg total) by nebulization 2 (two) times daily. 120 mL 11   butalbital -acetaminophen -caffeine  (FIORICET ) 50-325-40 MG tablet TAKE 1 TABLET BY MOUTH EVERY 6 HOURS AS NEEDED FOR HEADACHE 60 tablet 0   cyclobenzaprine  (FLEXERIL ) 5 MG tablet TAKE 1-2 AT BEDTIME AS NEEDED FOR HEADACHE AND MUSCLE SPASM 60 tablet 3   EPINEPHrine  0.3 mg/0.3 mL IJ SOAJ injection Inject 0.3 mg into the muscle once as needed for anaphylaxis.     fexofenadine (ALLEGRA) 180 MG tablet Take 180 mg by mouth every morning.     fluticasone  (FLONASE ) 50 MCG/ACT nasal spray Place 2 sprays into both nostrils in the morning and at bedtime. 16 g 11   furosemide  (LASIX ) 20 MG tablet TAKE 1 (ONE) TABLET DAILY AS NEEDED 90 tablet 1   gabapentin  (NEURONTIN ) 300 MG capsule Take 1-2 capsules (300-600 mg total) by mouth 3 (three) times daily as needed. 270 capsule 1   hydrocortisone  (CORTEF ) 10 MG tablet Take 1 tablet (10 mg total) by mouth every  morning. 90 tablet 1   hydroxychloroquine  (PLAQUENIL ) 200 MG tablet Take 1 tablet (200 mg total) by mouth 2 (two) times daily. Hold until ok with your rheumatologist     ipratropium (ATROVENT ) 0.03 % nasal spray Place 2 sprays into both nostrils every 12 (twelve) hours. 30 mL 12   ketorolac  (ACULAR ) 0.5 % ophthalmic solution 1 drop See admin instructions. Apply one drop into left eye daily at bedtime, may also use up to three more times daily in affected eye(s) as needed for scleritis     methocarbamol  (ROBAXIN ) 750 MG tablet Take 1 tablet (750 mg total) by mouth every 8 (eight) hours as needed for muscle spasms. 90 tablet 1   metoprolol  tartrate (LOPRESSOR ) 50 MG tablet Take 1.5 tablets (75 mg total) by mouth 2 (two) times daily. (Patient taking differently: Take 50 mg by mouth 2 (two) times daily.) 270 tablet 3   mometasone -formoterol  (DULERA ) 200-5 MCG/ACT AERO Inhale 2 puffs into the lungs in the morning and at bedtime. 3 each 4   multivitamin-iron-minerals-folic acid  (THERAPEUTIC-M) TABS tablet Take 1 tablet by mouth daily.     Olodaterol HCl (STRIVERDI RESPIMAT ) 2.5 MCG/ACT AERS Inhale 2 puffs into the lungs in the morning and at bedtime. 4 g 3   ondansetron  (ZOFRAN ) 8 MG tablet Take 1 tablet (8 mg total) by mouth every 8 (eight) hours as needed for nausea or vomiting. 30 tablet 2   prednisoLONE  acetate (PRED FORTE ) 1 % ophthalmic suspension Place 1 drop into both eyes 4 (four) times daily as needed.     Tofacitinib Citrate ER (XELJANZ XR) 11 MG TB24 Take 11 mg by mouth daily.     Ubrogepant  (UBRELVY ) 100 MG TABS Take 100 mg by mouth once as needed for migraine.  May repeat in one hour. Max dose 200 in 24 hours 16 tablet 5   acetaminophen  (TYLENOL ) 500 MG tablet Take 500 mg by mouth every 6 (six) hours as needed.     No current facility-administered medications on file prior to visit.    Review of Systems:  As per HPI- otherwise negative.   Physical Examination: Vitals:   01/14/24 1050   BP: 128/80  Pulse: 81  Temp: 98.1 F (36.7 C)  SpO2: 99%   Vitals:   01/14/24 1050  Weight: (!) 354 lb 1.6 oz (160.6 kg)  Height: 5' 7 (1.702  m)   Body mass index is 55.46 kg/m. Ideal Body Weight: Weight in (lb) to have BMI = 25: 159.3  GEN: no acute distress.  Obese, looks well.  Her alopecia has resolved HEENT: Atraumatic, Normocephalic. Bilateral TM wnl, oropharynx normal.  PEERL,EOMI.   Ears and Nose: No external deformity. CV: RRR, No M/G/R. No JVD. No thrill. No extra heart sounds. PULM: CTA B, no wheezes, crackles, rhonchi. No retractions. No resp. distress. No accessory muscle use. ABD: S, NT, ND, +BS. No rebound. No HSM. EXTR: No c/c/e PSYCH: Normally interactive. Conversant.  Collected past.  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 Pap smear due. - Gave flu shot and no new vaccine - Recommend COVID booster in a couple of months  Rheumatoid arthritis and ANCA-associated vasculitis Rheumatoid arthritis confirmed. ANCA-associated vasculitis managed with Earma, improving symptoms. - Continue Xeljanz as prescribed. - Request rheumatology notes and endocrinology notes, will abstract labs  Raynaud's phenomenon New diagnosis with no severe symptoms requiring treatment.  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 Chronic MCL tear in left knee. Prefers to delay knee replacement. - Attend orthopedic consultation on January 24, 2024.  Lower extremity edema Increased frequency of edema, managed with furosemide . - Continue furosemide  as needed for edema.  Chronic migraine without aura Migraines managed with Ubrelvy , effective with no recent exacerbations. - Continue Ubrelvy  as needed.  Moderate persistent asthma Asthma management not specifically discussed. No acute exacerbations reported.  Thyroid  nodules under surveillance Nodules monitored by endocrinology. Recent thyroid  function tests normal. Ultrasound scheduled for February 2026. - Continue surveillance with endocrinology. - Attend thyroid  ultrasound in February 2026.  General Health Maintenance Discussed vaccinations and screenings. Last A1c normal. Lipid panel monitored yearly. COVID booster and pneumococcal vaccine recommended. - Administer flu vaccine. - Administer Prevnar vaccine. - Recommend COVID booster this fall.  Signed Harlene Schroeder, MD

## 2024-01-14 ENCOUNTER — Encounter: Payer: Self-pay | Admitting: Family Medicine

## 2024-01-14 ENCOUNTER — Ambulatory Visit (INDEPENDENT_AMBULATORY_CARE_PROVIDER_SITE_OTHER): Admitting: Family Medicine

## 2024-01-14 ENCOUNTER — Other Ambulatory Visit (HOSPITAL_COMMUNITY)
Admission: RE | Admit: 2024-01-14 | Discharge: 2024-01-14 | Disposition: A | Source: Ambulatory Visit | Attending: Family Medicine | Admitting: Family Medicine

## 2024-01-14 VITALS — BP 128/80 | HR 81 | Temp 98.1°F | Ht 67.0 in | Wt 354.1 lb

## 2024-01-14 DIAGNOSIS — I10 Essential (primary) hypertension: Secondary | ICD-10-CM

## 2024-01-14 DIAGNOSIS — E2749 Other adrenocortical insufficiency: Secondary | ICD-10-CM | POA: Diagnosis not present

## 2024-01-14 DIAGNOSIS — Z0000 Encounter for general adult medical examination without abnormal findings: Secondary | ICD-10-CM

## 2024-01-14 DIAGNOSIS — Z124 Encounter for screening for malignant neoplasm of cervix: Secondary | ICD-10-CM | POA: Insufficient documentation

## 2024-01-14 DIAGNOSIS — M0520 Rheumatoid vasculitis with rheumatoid arthritis of unspecified site: Secondary | ICD-10-CM

## 2024-01-14 DIAGNOSIS — M3130 Wegener's granulomatosis without renal involvement: Secondary | ICD-10-CM | POA: Diagnosis not present

## 2024-01-14 DIAGNOSIS — Z23 Encounter for immunization: Secondary | ICD-10-CM

## 2024-01-14 MED ORDER — LOSARTAN POTASSIUM 50 MG PO TABS
75.0000 mg | ORAL_TABLET | Freq: Every day | ORAL | 3 refills | Status: AC
Start: 2024-01-14 — End: ?

## 2024-01-14 NOTE — Patient Instructions (Addendum)
 Great to see you today!   Flu and pneumonia vaccinations today I will be in touch with your pap

## 2024-01-16 ENCOUNTER — Encounter: Payer: Self-pay | Admitting: Family Medicine

## 2024-01-16 LAB — CYTOLOGY - PAP
Comment: NEGATIVE
Diagnosis: NEGATIVE
Diagnosis: REACTIVE
High risk HPV: NEGATIVE

## 2024-01-24 DIAGNOSIS — H524 Presbyopia: Secondary | ICD-10-CM | POA: Diagnosis not present

## 2024-01-24 DIAGNOSIS — Z79899 Other long term (current) drug therapy: Secondary | ICD-10-CM | POA: Diagnosis not present

## 2024-01-24 DIAGNOSIS — S83511A Sprain of anterior cruciate ligament of right knee, initial encounter: Secondary | ICD-10-CM | POA: Diagnosis not present

## 2024-01-24 DIAGNOSIS — H5005 Alternating esotropia: Secondary | ICD-10-CM | POA: Diagnosis not present

## 2024-01-24 DIAGNOSIS — H53042 Amblyopia suspect, left eye: Secondary | ICD-10-CM | POA: Diagnosis not present

## 2024-01-24 DIAGNOSIS — S83241D Other tear of medial meniscus, current injury, right knee, subsequent encounter: Secondary | ICD-10-CM | POA: Diagnosis not present

## 2024-01-29 ENCOUNTER — Ambulatory Visit: Admitting: Internal Medicine

## 2024-01-29 ENCOUNTER — Encounter: Payer: Self-pay | Admitting: Internal Medicine

## 2024-01-29 VITALS — BP 125/81 | HR 77 | Temp 97.5°F | Ht 67.0 in | Wt 357.0 lb

## 2024-01-29 DIAGNOSIS — R0602 Shortness of breath: Secondary | ICD-10-CM | POA: Diagnosis not present

## 2024-01-29 DIAGNOSIS — M069 Rheumatoid arthritis, unspecified: Secondary | ICD-10-CM

## 2024-01-29 DIAGNOSIS — M3131 Wegener's granulomatosis with renal involvement: Secondary | ICD-10-CM | POA: Diagnosis not present

## 2024-01-29 DIAGNOSIS — J4550 Severe persistent asthma, uncomplicated: Secondary | ICD-10-CM | POA: Diagnosis not present

## 2024-01-29 DIAGNOSIS — J309 Allergic rhinitis, unspecified: Secondary | ICD-10-CM

## 2024-01-29 DIAGNOSIS — M3130 Wegener's granulomatosis without renal involvement: Secondary | ICD-10-CM

## 2024-01-29 DIAGNOSIS — R600 Localized edema: Secondary | ICD-10-CM

## 2024-01-29 MED ORDER — SEREVENT DISKUS 50 MCG/ACT IN AEPB: 1.0000 | INHALATION_SPRAY | Freq: Two times a day (BID) | RESPIRATORY_TRACT | 0 refills | Status: AC

## 2024-01-29 NOTE — Progress Notes (Signed)
 Diane Wells    [id number]    03-07-1981  Primary Care Physician:Copland, Harlene BROCKS, MD Date of Appointment: 01/29/2024 Established Patient Visit  Chief complaint:   Chief Complaint  Patient presents with   Asthma    Asthma somewhat controlled.    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  I have detailed her asthma history below.  Her more recent history begins in the last year when she developed arthralgias and neuropathy as well as recurrent sinus issues in early 2020.    GPA history: Dr. Ishmael at [facility] Rheumatology Dr. Buck in Neurology Dr. Watt family medicine Ophthalomology - diagnosed with episcleritis and is on topical eye drops Nephrology - Dr. Norine. Biopsy deferred due to presumptive diagnosis ultrasound findings, and proteinuria and hematuria.  Interval Updates: Here for follow up for asthma.   Hospitalized in August 2025 with severe covid. Hypoxemic but not in ICU. Has had lower extremity edema since then. Received steroids  No prednisone  use outside the above. But sob with minimal exertion.   Has tried striverdi and does not feel it is helping her breathing. Felt best on perforomist  nebulizer. She says her insurance didn't cover the air supra but I see that it is on formulary.   RA: Earma and plaquenil , hydrocortisone  10 mg daily for adrenal insufficiency  Labs reviewed - CMET August 2025 normal GFR BNP 83 which is wnl in August.   Feels worsening lower extremity edema.Had some sinus issues and chronic drainage which triggers use of albuterol .  Uses flonase  religiously. Using nasal saline almost daily.  Doing iptratropium and flonase  nasal spras with nasal saline.   Hasn't needed antibiotics in the last year.   Current Regimen: on budesonide  nebs and striverdi respimate. Albuterol  MDI (allergic to nebulizer solution)using 1-2 times/week Asthma Triggers:seasonal and environmental allergies,  heat, humidty.  Exacerbations in the last year: 1 during covid in August 2025 History of hospitalization or intubation: none, ED visit in Feb 2021 Hives: anaphylaxis  Allergy Testing: Had SPT testing 2018 years ago: grass, tree, mold, feather, cat dust mites. Wasn't able to keep up with SCIT in the past due to schedules  GERD: denies Allergic Rhinitis: yes ACT:  Asthma Control Test ACT Total Score  01/29/2024 10:59 AM 16  09/14/2023 11:03 AM 18  05/29/2022 11:03 AM 14   FeNO: 24 ppb while on high dose prednisone  previously - 16 ppb on 05/29/22.   I have reviewed the patient's family social and past medical history and updated as appropriate.   Past Medical History:  Diagnosis Date   Adrenal insufficiency    Allergic rhinitis    Asthma    Astigmatism    Chronic kidney disease    Cushing's disease (HCC)    idiopathic   Granulomatosis with polyangiitis (HCC)    Hypertension    Neutropenia    hospitalized in early 2023 for 9 days   Recurrent upper respiratory infection (URI)     Past Surgical History:  Procedure Laterality Date   ADENOIDECTOMY     ANTERIOR CERVICAL DECOMP/DISCECTOMY FUSION      Family History  Problem Relation Age of Onset   Diabetes Mother        Type 1 Diabetes, juvenile onset   Hypertension Mother    Pulmonary Hypertension Mother    Hyperthyroidism Mother    Glaucoma Mother    Neuropathy Mother    Sjogren's syndrome Mother    Carpal tunnel  syndrome Mother    Hypertension Father    Gallbladder disease Father    Breast cancer Sister 61       metastic IBC   Diabetes Maternal Uncle    Heart attack Maternal Uncle    Stroke Maternal Uncle    Hypertension Maternal Grandmother    Glaucoma Maternal Grandmother    Coronary artery disease Maternal Grandmother    Heart attack Maternal Grandmother    Angina Maternal Grandmother    Macular degeneration Maternal Grandmother    COPD Maternal Grandmother    Congestive Heart Failure Maternal Grandmother     Glaucoma Maternal Grandfather    Leukemia Paternal Grandmother    Hypertension Paternal Grandmother    Colon cancer Paternal Grandfather    Allergic rhinitis Neg Hx    Angioedema Neg Hx    Asthma Neg Hx    Eczema Neg Hx    Pseudotumor cerebri Neg Hx    Migraines Neg Hx     Social History   Occupational History   Not on file  Tobacco Use   Smoking status: Never   Smokeless tobacco: Never  Vaping Use   Vaping status: Never Used  Substance and Sexual Activity   Alcohol use: Not Currently    Comment: was rare   Drug use: No   Sexual activity: Not Currently    Birth control/protection: None     Physical Exam: Blood pressure 125/81, pulse 77, temperature (!) 97.5 F (36.4 C), temperature source Oral, height 5' 7 (1.702 m), weight (!) 357 lb (161.9 kg), last menstrual period 12/17/2023, SpO2 96%.  Gen:      No distress, fatigued, obese Lungs:  diminished, no wheeze CV:         RRR no murmur MSK:     no rashes Ext: non pitting le edema  Data Reviewed: Imaging: I have personally reviewed the chest xray July 2021 no acute cardiopulmonary process  CT sinus without evidence of sinusitis or bony destruction.   PFTs:     Latest Ref Rng & Units 06/16/2020   10:55 AM 06/03/2019    2:57 PM  PFT Results  FVC-Pre L 3.38  3.87   FVC-Predicted Pre % 85  97   FVC-Post L 2.92  3.42   FVC-Predicted Post % 73  86   Pre FEV1/FVC % % 86  84   Post FEV1/FCV % % 91  88   FEV1-Pre L 2.92  3.27   FEV1-Predicted Pre % 90  100   FEV1-Post L 2.65  3.00   DLCO uncorrected ml/min/mmHg  18.88   DLCO UNC% %  80   DLCO corrected ml/min/mmHg  20.57   DLCO COR %Predicted %  87   DLVA Predicted %  95   TLC L  5.28   TLC % Predicted %  98   RV % Predicted %  86    I have personally reviewed the patient's PFTs and they are without airflow limitation, have normal lung volumes, and normal diffusion capacity. No significant BD response. Feno is 24ppb which is not elevated in march 2022.    Labs: Labs reviewed June 22 2023 - Cr 1.02, CMET otherwise normal.    Immunization status: Immunization History  Administered Date(s) Administered   Fluzone Influenza virus vaccine,trivalent (IIV3), split virus 12/25/2020   Influenza, Quadrivalent, Recombinant, Inj, Pf 01/15/2021   Influenza, Seasonal, Injecte, Preservative Fre 01/14/2024   Influenza,inj,Quad PF,6+ Mos 01/10/2016, 05/16/2018, 01/28/2019, 01/01/2020, 05/29/2022   Influenza,trivalent, recombinat, inj, PF 01/18/2023  Influenza-Unspecified 12/25/2020   Moderna Sars-Covid-2 Vaccination 11/21/2019, 11/22/2019, 12/09/2019, 11/21/2020, 12/18/2020   PFIZER Comirnaty(Gray Top)Covid-19 Tri-Sucrose Vaccine 03/22/2020   PFIZER(Purple Top)SARS-COV-2 Vaccination 03/22/2020   PNEUMOCOCCAL CONJUGATE-20 01/14/2024   Pfizer Covid-19 Vaccine Bivalent Booster 57yrs & up 03/11/2021   Td 02/24/2016   Td (Adult),5 Lf Tetanus Toxid, Preservative Free 02/24/2016   Tdap 03/27/2005    Assessment:  Granulomatosis with Polyangiitis, 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  Severe Persistent Allergic Asthma Allergic Rhinitis Nasal congestion with recurrent sinus infections  Plan/Recommendations: Continue budeonide nebs twice daily.   Will try again with air supra for your rescue inhaler. Continue albuterol  until this is approved up to 4 times a day as needed for shortness of breath, chest tightness, wheezing.  Since striverdi didn't work, let's try serevent. If this doesn't work we will do the PA for formoterol  nebulizer.   Continue fluticasone  and ipratropium with intranasal saline as you are doing.  Will get an echocardiogram of your heart to follow up on lower extremity swelling.  Continue ipratropium nasal spray up to 4 times a day as needed for nasal drainage.   I spent 40 minutes in the care of this patient today including pre-charting, chart review, review of results, face-to-face care,  coordination of care and communication with consultants etc.).  Return to Care: Return in about 4 weeks (around 02/26/2024) for with myself - use same day slot or split a new patient slot.   Verdon Gore, MD Pulmonary and Critical Care Medicine [facility] Office:[phone]

## 2024-01-29 NOTE — Patient Instructions (Signed)
 It was a pleasure to see you today!  Please schedule follow up with myself in 1 months.  If my schedule is not open yet, we will contact you with a reminder closer to that time. Please call [phone] if you haven't heard from us  a month before, and always call us  sooner if issues or concerns arise. You can also send us  a message through MyChart, but but aware that this is not to be used for urgent issues and it may take up to 5-7 days to receive a reply. Please be aware that you will likely be able to view your results before I have a chance to respond to them. Please give us  5 business days to respond to any non-urgent results.    Before your next visit I would like you to have: Echocardiogram  Continue budeonide nebs twice daily.   Will try again with air supra for your rescue inhaler. Continue albuterol  until this is approved up to 4 times a day as needed for shortness of breath, chest tightness, wheezing.  Since striverdi didn't work, let's try serevent. If this doesn't work we will do the PA for formoterol  nebulizer.   Continue fluticasone  and ipratropium with intranasal saline as you are doing.  Will get an echocardiogram of your heart to follow up on lower extremity swelling.  Will add ipratropium nasal spray up to 4 times a day as needed for nasal drainage.

## 2024-01-30 ENCOUNTER — Telehealth: Payer: Self-pay | Admitting: Neurology

## 2024-01-30 NOTE — Telephone Encounter (Signed)
 Pt called to Cancel appt due to transportation   Appt  Scheduled

## 2024-02-06 ENCOUNTER — Ambulatory Visit: Admitting: Neurology

## 2024-02-11 ENCOUNTER — Other Ambulatory Visit: Payer: Self-pay | Admitting: Family Medicine

## 2024-02-11 DIAGNOSIS — G5691 Unspecified mononeuropathy of right upper limb: Secondary | ICD-10-CM

## 2024-02-11 DIAGNOSIS — M5090 Cervical disc disorder, unspecified, unspecified cervical region: Secondary | ICD-10-CM

## 2024-02-11 DIAGNOSIS — G43709 Chronic migraine without aura, not intractable, without status migrainosus: Secondary | ICD-10-CM

## 2024-02-18 ENCOUNTER — Telehealth: Payer: Self-pay

## 2024-02-18 ENCOUNTER — Ambulatory Visit: Payer: Self-pay | Admitting: Internal Medicine

## 2024-02-18 ENCOUNTER — Ambulatory Visit (HOSPITAL_BASED_OUTPATIENT_CLINIC_OR_DEPARTMENT_OTHER)
Admission: RE | Admit: 2024-02-18 | Discharge: 2024-02-18 | Disposition: A | Discharge status: Home / Self Care | Source: Ambulatory Visit | Attending: Internal Medicine | Admitting: Internal Medicine

## 2024-02-18 DIAGNOSIS — R0602 Shortness of breath: Secondary | ICD-10-CM | POA: Diagnosis not present

## 2024-02-18 DIAGNOSIS — R600 Localized edema: Secondary | ICD-10-CM | POA: Diagnosis not present

## 2024-02-18 DIAGNOSIS — I10 Essential (primary) hypertension: Secondary | ICD-10-CM

## 2024-02-18 LAB — ECHOCARDIOGRAM COMPLETE
AR max vel: 2.13 cm2
AV Area VTI: 2.22 cm2
AV Area mean vel: 2.27 cm2
AV Mean grad: 5 mmHg
AV Peak grad: 9.4 mmHg
Ao pk vel: 1.53 m/s
Area-P 1/2: 3.02 cm2
Calc EF: 60 %
S' Lateral: 3.3 cm
Single Plane A2C EF: 62.5 %
Single Plane A4C EF: 57.1 %

## 2024-02-18 MED ORDER — METOPROLOL TARTRATE 50 MG PO TABS: 75.0000 mg | ORAL_TABLET | Freq: Two times a day (BID) | ORAL | 1 refills | Status: AC

## 2024-02-18 NOTE — Telephone Encounter (Signed)
Rx sent to [facility] 

## 2024-02-18 NOTE — Telephone Encounter (Signed)
 Copied from CRM [id number]. Topic: Clinical - Prescription Issue >> Feb 18, 2024  9:27 AM Gustabo D wrote: metoprolol  tartrate (LOPRESSOR ) 50 MG tablet- pt says she needs a new prescription for with a different dose. She's not sure which. Also she has checked with both pharmacies and they don't her prescription Please send to  [facility] [id number] - [place], [place] - [address] [place] [address] Phone: [phone] Fax: [phone] Hours: Not open 24 hours

## 2024-03-12 ENCOUNTER — Ambulatory Visit: Admitting: Internal Medicine

## 2024-03-12 ENCOUNTER — Encounter: Payer: Self-pay | Admitting: Internal Medicine

## 2024-03-12 VITALS — BP 134/80 | HR 91 | Ht 67.0 in | Wt 365.0 lb

## 2024-03-12 DIAGNOSIS — R809 Proteinuria, unspecified: Secondary | ICD-10-CM | POA: Diagnosis not present

## 2024-03-12 DIAGNOSIS — J309 Allergic rhinitis, unspecified: Secondary | ICD-10-CM | POA: Diagnosis not present

## 2024-03-12 DIAGNOSIS — M3130 Wegener's granulomatosis without renal involvement: Secondary | ICD-10-CM | POA: Diagnosis not present

## 2024-03-12 DIAGNOSIS — M3131 Wegener's granulomatosis with renal involvement: Secondary | ICD-10-CM

## 2024-03-12 DIAGNOSIS — R0981 Nasal congestion: Secondary | ICD-10-CM | POA: Diagnosis not present

## 2024-03-12 DIAGNOSIS — M0579 Rheumatoid arthritis with rheumatoid factor of multiple sites without organ or systems involvement: Secondary | ICD-10-CM

## 2024-03-12 DIAGNOSIS — H15009 Unspecified scleritis, unspecified eye: Secondary | ICD-10-CM | POA: Diagnosis not present

## 2024-03-12 DIAGNOSIS — J4550 Severe persistent asthma, uncomplicated: Secondary | ICD-10-CM | POA: Diagnosis not present

## 2024-03-12 DIAGNOSIS — R600 Localized edema: Secondary | ICD-10-CM

## 2024-03-12 MED ORDER — TORSEMIDE 20 MG PO TABS: 20.0000 mg | ORAL_TABLET | Freq: Every day | ORAL | 3 refills | Status: AC

## 2024-03-12 MED ORDER — BUDESONIDE 0.25 MG/2ML IN SUSP: 0.2500 mg | Freq: Two times a day (BID) | RESPIRATORY_TRACT | 11 refills | Status: AC

## 2024-03-12 NOTE — Patient Instructions (Addendum)
 It was a pleasure to see you today!  Please schedule follow up with Dr Adrien in 3 months. Please call sooner [phone] if issues or concerns arise. You can also send us  a message through MyChart, but but aware that this is not to be used for urgent issues and it may take up to 5-7 days to receive a reply. Please be aware that you will likely be able to view your results before I have a chance to respond to them. Please give us  5 business days to respond to any non-urgent results.    Continue budesonide  nebs twice daily.  Continue airsupra  2 puffs up to 4 times a day as needed.   Tried and failed: striverdi, serevent .  Needs PA for perforomist  twice daily.   Will change from furosemide  to torsemide  due to lack of efficacy. Labs in 1 week to check kidney function and electrolytes. She is getting labs today with Dr. Ishmael.   Check lower extremity dopplers to make sure you DVT, although my suspicion is low.   Continue ipratropium nasal spray up to 4 times a day as needed for nasal drainage with flonase .

## 2024-03-12 NOTE — Progress Notes (Signed)
 Diane Wells    [id number]    Dec 02, 1980  Primary Care Physician:Copland, Harlene BROCKS, MD Date of Appointment: 03/12/2024 Established Patient Visit  Chief complaint:   Chief Complaint  Patient presents with   Medical Management of Chronic Issues   Asthma    Breathing worse since the started on serevent . She has occ wheezing with exertion.     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  I have detailed her asthma history below.  Her more recent history begins in the last year when she developed arthralgias and neuropathy as well as recurrent sinus issues in early 2020.    GPA history: Dr. Ishmael at [facility] Rheumatology Dr. Buck in Neurology Dr. Watt family medicine Ophthalomology - diagnosed with episcleritis and is on topical eye drops Nephrology - Dr. Norine. Biopsy deferred due to presumptive diagnosis ultrasound findings, and proteinuria and hematuria.  Interval Updates: Here for asthma follow up.   Re: lower extremity edema  No interval exacerbations or prednisone  use.   Has tried and failed striverdi. Was started on severvent and has not had improvement in respiratory symptoms Wants to go back on performist which she felt best on.   Hasn't been taking budesonide  because she thought the serevent  had a steroid in it.   RA: Diane Wells and plaquenil , hydrocortisone  10 mg daily for adrenal insufficiency  Ongoing LE edema since over the summer hospitalized for covid and getting high dose steroids. Taking lasix  40 mg daily without improvement. Doesn't feel like it is making her pee as much. Echo done - no heart failure. Lower   Nasal drainage controlled with flonase  and ipratropium.   Current Regimen: on budesonide  nebs and striverdi respimate. Albuterol  MDI (allergic to nebulizer solution)using 1-2 times/week Asthma Triggers:seasonal and environmental allergies, heat, humidty.  Exacerbations in the last year: 1  during covid in August 2025 History of hospitalization or intubation: none, ED visit in Feb 2021 Hives: anaphylaxis  Allergy Testing: Had SPT testing 2018 years ago: grass, tree, mold, feather, cat dust mites. Wasn't able to keep up with SCIT in the past due to schedules  GERD: denies Allergic Rhinitis: yes ACT:  Asthma Control Test ACT Total Score  03/12/2024 10:05 AM 13  01/29/2024 10:59 AM 16  09/14/2023 11:03 AM 18   FeNO: 24 ppb while on high dose prednisone  previously - 16 ppb on 05/29/22.   I have reviewed the patient's family social and past medical history and updated as appropriate.   Past Medical History:  Diagnosis Date   Adrenal insufficiency    Allergic rhinitis    Asthma    Astigmatism    Chronic kidney disease    Cushing's disease (HCC)    idiopathic   Granulomatosis with polyangiitis (HCC)    Hypertension    Neutropenia    hospitalized in early 2023 for 9 days   Recurrent upper respiratory infection (URI)     Past Surgical History:  Procedure Laterality Date   ADENOIDECTOMY     ANTERIOR CERVICAL DECOMP/DISCECTOMY FUSION      Family History  Problem Relation Age of Onset   Diabetes Mother        Type 1 Diabetes, juvenile onset   Hypertension Mother    Pulmonary Hypertension Mother    Hyperthyroidism Mother    Glaucoma Mother    Neuropathy Mother    Sjogren's syndrome Mother    Carpal tunnel syndrome Mother    Hypertension Father  Gallbladder disease Father    Breast cancer Sister 21       metastic IBC   Diabetes Maternal Uncle    Heart attack Maternal Uncle    Stroke Maternal Uncle    Hypertension Maternal Grandmother    Glaucoma Maternal Grandmother    Coronary artery disease Maternal Grandmother    Heart attack Maternal Grandmother    Angina Maternal Grandmother    Macular degeneration Maternal Grandmother    COPD Maternal Grandmother    Congestive Heart Failure Maternal Grandmother    Glaucoma Maternal Grandfather    Leukemia  Paternal Grandmother    Hypertension Paternal Grandmother    Colon cancer Paternal Grandfather    Allergic rhinitis Neg Hx    Angioedema Neg Hx    Asthma Neg Hx    Eczema Neg Hx    Pseudotumor cerebri Neg Hx    Migraines Neg Hx     Social History   Occupational History   Not on file  Tobacco Use   Smoking status: Never   Smokeless tobacco: Never  Vaping Use   Vaping status: Never Used  Substance and Sexual Activity   Alcohol use: Not Currently    Comment: was rare   Drug use: No   Sexual activity: Not Currently    Birth control/protection: None     Physical Exam: Blood pressure 134/80, pulse 91, height 5' 7 (1.702 m), weight (!) 365 lb (165.6 kg), SpO2 97%.  Gen:   No distress, obese Lungs:  breathing non labored, no wheeze CV:         RRR no murmur Abd: distended, soft MSK:     rashes/excoriations on hands Ext: non pitting le edema  Data Reviewed: Imaging: I have personally reviewed the chest xray July 2021 no acute cardiopulmonary process  CT sinus without evidence of sinusitis or bony destruction.   PFTs:     Latest Ref Rng & Units 06/16/2020   10:55 AM 06/03/2019    2:57 PM  PFT Results  FVC-Pre L 3.38  3.87   FVC-Predicted Pre % 85  97   FVC-Post L 2.92  3.42   FVC-Predicted Post % 73  86   Pre FEV1/FVC % % 86  84   Post FEV1/FCV % % 91  88   FEV1-Pre L 2.92  3.27   FEV1-Predicted Pre % 90  100   FEV1-Post L 2.65  3.00   DLCO uncorrected ml/min/mmHg  18.88   DLCO UNC% %  80   DLCO corrected ml/min/mmHg  20.57   DLCO COR %Predicted %  87   DLVA Predicted %  95   TLC L  5.28   TLC % Predicted %  98   RV % Predicted %  86    I have personally reviewed the patient's PFTs and they are without airflow limitation, have normal lung volumes, and normal diffusion capacity. No significant BD response. Feno is 24ppb which is not elevated in march 2022.   Labs: Lab Results  Component Value Date   NA 138 05/09/2023   K 4.1 05/09/2023   CO2 29 (A)  05/09/2023   GLUCOSE 89 06/15/2021   BUN 11 05/09/2023   CREATININE 1.0 05/09/2023   CALCIUM 8.9 05/09/2023   GFR 56.67 (L) 06/03/2021   EGFR 71 05/09/2023   GFRNONAA >60 06/15/2021      Immunization status: Immunization History  Administered Date(s) Administered   Fluzone Influenza virus vaccine,trivalent (IIV3), split virus 12/25/2020   Influenza, Quadrivalent, Recombinant, Inj,  Pf 01/15/2021   Influenza, Seasonal, Injecte, Preservative Fre 01/14/2024   Influenza,inj,Quad PF,6+ Mos 01/10/2016, 05/16/2018, 01/28/2019, 01/01/2020, 05/29/2022   Influenza,trivalent, recombinat, inj, PF 01/18/2023   Influenza-Unspecified 12/25/2020   Moderna Sars-Covid-2 Vaccination 11/21/2019, 11/22/2019, 12/09/2019, 11/21/2020, 12/18/2020   PFIZER Comirnaty(Gray Top)Covid-19 Tri-Sucrose Vaccine 03/22/2020   PFIZER(Purple Top)SARS-COV-2 Vaccination 03/22/2020   PNEUMOCOCCAL CONJUGATE-20 01/14/2024   Pfizer Covid-19 Vaccine Bivalent Booster 84yrs & up 03/11/2021   Td 02/24/2016   Td (Adult),5 Lf Tetanus Toxid, Preservative Free 02/24/2016   Tdap 03/27/2005    Assessment:  Granulomatosis with Polyangiitis, 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  Severe Persistent Allergic Asthma Allergic Rhinitis Nasal congestion with recurrent sinus infections Lower extremity swelling  Plan/Recommendations: Continue budesonide  nebs twice daily.  Continue airsupra  2 puffs up to 4 times a day as needed.   Tried and failed: striverdi, serevent .  Needs PA for perforomist  twice daily.   Will change from furosemide  to torsemide  due to lack of efficacy. Labs in 1 week to check kidney function and electrolytes. She is getting labs today with Dr. Ishmael.   Check lower extremity dopplers to make sure you DVT, although my suspicion is low.   Continue ipratropium nasal spray up to 4 times a day as needed for nasal drainage with flonase .    Return to Care: Return in about 3 months  (around 06/10/2024) for Dr. Adrien schedule after PFT.   Verdon Gore, MD Pulmonary and Critical Care Medicine [facility] Office:[phone]

## 2024-03-13 ENCOUNTER — Other Ambulatory Visit (HOSPITAL_COMMUNITY): Payer: Self-pay

## 2024-03-13 ENCOUNTER — Telehealth: Payer: Self-pay | Admitting: Pharmacist

## 2024-03-13 ENCOUNTER — Telehealth: Payer: Self-pay

## 2024-03-13 NOTE — Telephone Encounter (Signed)
*  Pulm  Pharmacy Patient Advocate Encounter  Received notification from [facility] that Prior Authorization for Perforomist  [place] has been CANCELLED due to   Previous denial on file. Message sent to appeals team.   Original denial letter in chart - 11/08/2023

## 2024-03-13 NOTE — Telephone Encounter (Signed)
 Appeal has been submitted. Will advise when response is received, please be advised that most companies may take 30 days to make a decision. Appeal letter and supporting documentation have been faxed [phone] on 03/13/2024 @1 :37 pm.  Thank you, Devere Pandy, PharmD Clinical Pharmacist  [place]  Direct Dial: [phone]

## 2024-03-17 ENCOUNTER — Ambulatory Visit: Admitting: Family Medicine

## 2024-03-19 ENCOUNTER — Other Ambulatory Visit (HOSPITAL_COMMUNITY): Payer: Self-pay

## 2024-04-07 ENCOUNTER — Other Ambulatory Visit (HOSPITAL_COMMUNITY): Payer: Self-pay

## 2024-04-07 NOTE — Telephone Encounter (Signed)
 Per a test claim through our [facility] pharmacy, medication has been approved:

## 2024-04-07 NOTE — Telephone Encounter (Signed)
 Left vmail for patient that perforomist  approved and patient portion $63.36. Asked if she would call back and notify provider how she would like to proceed.

## 2024-04-09 ENCOUNTER — Ambulatory Visit (HOSPITAL_BASED_OUTPATIENT_CLINIC_OR_DEPARTMENT_OTHER)
Admission: RE | Admit: 2024-04-09 | Discharge: 2024-04-09 | Disposition: A | Discharge status: Home / Self Care | Source: Ambulatory Visit | Attending: Internal Medicine | Admitting: Internal Medicine

## 2024-04-09 ENCOUNTER — Other Ambulatory Visit (INDEPENDENT_AMBULATORY_CARE_PROVIDER_SITE_OTHER)

## 2024-04-09 DIAGNOSIS — R600 Localized edema: Secondary | ICD-10-CM | POA: Diagnosis not present

## 2024-04-09 LAB — BASIC METABOLIC PANEL WITH GFR
BUN: 15 mg/dL (ref 6–23)
CO2: 28 meq/L (ref 19–32)
Calcium: 8.7 mg/dL (ref 8.4–10.5)
Chloride: 99 meq/L (ref 96–112)
Creatinine, Ser: 1.14 mg/dL (ref 0.40–1.20)
GFR: 59.08 mL/min — ABNORMAL LOW
Glucose, Bld: 94 mg/dL (ref 70–99)
Potassium: 3.4 meq/L — ABNORMAL LOW (ref 3.5–5.1)
Sodium: 137 meq/L (ref 135–145)

## 2024-04-09 NOTE — Addendum Note (Signed)
 Addended by: TRUDY CURVIN RAMAN on: 04/09/2024 08:27 AM   Modules accepted: Orders

## 2024-04-10 NOTE — Telephone Encounter (Signed)
 ATC x2.  LVM to return call.  Sent mychart message as well.

## 2024-04-11 MED ORDER — FORMOTEROL FUMARATE 20 MCG/2ML IN NEBU: 20.0000 ug | INHALATION_SOLUTION | Freq: Two times a day (BID) | RESPIRATORY_TRACT | 2 refills | Status: AC

## 2024-04-11 NOTE — Addendum Note (Signed)
 Addended by: Sunjai Levandoski M on: 04/11/2024 01:41 PM   Modules accepted: Orders

## 2024-04-11 NOTE — Telephone Encounter (Addendum)
 Per chart note from Dr. Meade on 03/12/2024:  Needs PA for perforomist  twice daily.   PA has been submitted and approved.  Will need to send in rx for Performist.  Notified patient via MyChart.

## 2024-05-16 ENCOUNTER — Encounter

## 2024-06-04 ENCOUNTER — Encounter
